# Patient Record
Sex: Male | Born: 1975
Health system: Southern US, Community
[De-identification: ages and names within clinical notes are randomized; demographics above are authoritative.]

## PROBLEM LIST (undated history)

## (undated) DIAGNOSIS — J45909 Unspecified asthma, uncomplicated: Secondary | ICD-10-CM

## (undated) HISTORY — PX: SKIN GRAFT: SHX250

## (undated) HISTORY — PX: SKULL FRACTURE ELEVATION: SHX781

## (undated) HISTORY — PX: ANKLE SURGERY: SHX546

## (undated) HISTORY — DX: Unspecified asthma, uncomplicated: J45.909

---

## 1997-08-14 ENCOUNTER — Emergency Department (HOSPITAL_COMMUNITY): Admission: EM | Admit: 1997-08-14 | Discharge: 1997-08-14 | Payer: Self-pay | Admitting: Emergency Medicine

## 1999-02-14 ENCOUNTER — Encounter: Payer: Self-pay | Admitting: Emergency Medicine

## 1999-02-14 ENCOUNTER — Emergency Department (HOSPITAL_COMMUNITY): Admission: EM | Admit: 1999-02-14 | Discharge: 1999-02-14 | Payer: Self-pay | Admitting: Emergency Medicine

## 1999-08-05 ENCOUNTER — Emergency Department (HOSPITAL_COMMUNITY): Admission: EM | Admit: 1999-08-05 | Discharge: 1999-08-05 | Payer: Self-pay | Admitting: Emergency Medicine

## 1999-08-05 ENCOUNTER — Encounter: Payer: Self-pay | Admitting: Emergency Medicine

## 2001-01-29 ENCOUNTER — Encounter: Payer: Self-pay | Admitting: Emergency Medicine

## 2001-01-29 ENCOUNTER — Emergency Department (HOSPITAL_COMMUNITY): Admission: EM | Admit: 2001-01-29 | Discharge: 2001-01-29 | Payer: Self-pay | Admitting: Emergency Medicine

## 2003-11-02 ENCOUNTER — Emergency Department (HOSPITAL_COMMUNITY): Admission: EM | Admit: 2003-11-02 | Discharge: 2003-11-02 | Payer: Self-pay | Admitting: Emergency Medicine

## 2004-05-15 ENCOUNTER — Emergency Department (HOSPITAL_COMMUNITY): Admission: EM | Admit: 2004-05-15 | Discharge: 2004-05-15 | Payer: Self-pay | Admitting: Emergency Medicine

## 2005-03-12 ENCOUNTER — Emergency Department (HOSPITAL_COMMUNITY): Admission: EM | Admit: 2005-03-12 | Discharge: 2005-03-12 | Payer: Self-pay | Admitting: Emergency Medicine

## 2005-04-10 ENCOUNTER — Emergency Department (HOSPITAL_COMMUNITY): Admission: EM | Admit: 2005-04-10 | Discharge: 2005-04-10 | Payer: Self-pay | Admitting: Emergency Medicine

## 2008-02-14 ENCOUNTER — Emergency Department (HOSPITAL_COMMUNITY): Admission: EM | Admit: 2008-02-14 | Discharge: 2008-02-14 | Payer: Self-pay | Admitting: Emergency Medicine

## 2008-05-12 ENCOUNTER — Emergency Department (HOSPITAL_COMMUNITY): Admission: EM | Admit: 2008-05-12 | Discharge: 2008-05-12 | Payer: Self-pay | Admitting: Emergency Medicine

## 2008-05-21 ENCOUNTER — Emergency Department (HOSPITAL_COMMUNITY): Admission: EM | Admit: 2008-05-21 | Discharge: 2008-05-21 | Payer: Self-pay | Admitting: Emergency Medicine

## 2008-05-25 ENCOUNTER — Encounter: Admission: RE | Admit: 2008-05-25 | Discharge: 2008-05-25 | Payer: Self-pay | Admitting: Family Medicine

## 2008-05-30 ENCOUNTER — Encounter: Admission: RE | Admit: 2008-05-30 | Discharge: 2008-08-10 | Payer: Self-pay | Admitting: Family Medicine

## 2008-12-02 ENCOUNTER — Emergency Department (HOSPITAL_COMMUNITY): Admission: EM | Admit: 2008-12-02 | Discharge: 2008-12-02 | Payer: Self-pay | Admitting: Emergency Medicine

## 2009-08-01 ENCOUNTER — Emergency Department (HOSPITAL_COMMUNITY): Admission: EM | Admit: 2009-08-01 | Discharge: 2009-08-01 | Payer: Self-pay | Admitting: Family Medicine

## 2009-09-03 ENCOUNTER — Emergency Department (HOSPITAL_COMMUNITY): Admission: EM | Admit: 2009-09-03 | Discharge: 2009-09-04 | Payer: Self-pay | Admitting: Emergency Medicine

## 2009-10-30 ENCOUNTER — Emergency Department (HOSPITAL_BASED_OUTPATIENT_CLINIC_OR_DEPARTMENT_OTHER): Admission: EM | Admit: 2009-10-30 | Discharge: 2009-10-30 | Payer: Self-pay | Admitting: Emergency Medicine

## 2009-10-30 ENCOUNTER — Ambulatory Visit: Payer: Self-pay | Admitting: Radiology

## 2009-11-21 ENCOUNTER — Emergency Department (HOSPITAL_COMMUNITY): Admission: EM | Admit: 2009-11-21 | Discharge: 2009-11-21 | Payer: Self-pay | Admitting: Family Medicine

## 2009-12-04 ENCOUNTER — Emergency Department (HOSPITAL_COMMUNITY): Admission: EM | Admit: 2009-12-04 | Discharge: 2009-12-04 | Payer: Self-pay | Admitting: Emergency Medicine

## 2010-01-09 ENCOUNTER — Emergency Department (HOSPITAL_COMMUNITY)
Admission: EM | Admit: 2010-01-09 | Discharge: 2010-01-09 | Payer: Self-pay | Source: Home / Self Care | Admitting: Family Medicine

## 2010-04-30 ENCOUNTER — Inpatient Hospital Stay (INDEPENDENT_AMBULATORY_CARE_PROVIDER_SITE_OTHER)
Admission: RE | Admit: 2010-04-30 | Discharge: 2010-04-30 | Disposition: A | Discharge status: Home / Self Care | Payer: Self-pay | Source: Ambulatory Visit | Attending: Family Medicine | Admitting: Family Medicine

## 2010-04-30 DIAGNOSIS — M1990 Unspecified osteoarthritis, unspecified site: Secondary | ICD-10-CM

## 2010-04-30 DIAGNOSIS — B353 Tinea pedis: Secondary | ICD-10-CM

## 2010-04-30 LAB — COMPREHENSIVE METABOLIC PANEL
AST: 20 U/L (ref 0–37)
Albumin: 4.1 g/dL (ref 3.5–5.2)
Alkaline Phosphatase: 74 U/L (ref 39–117)
Calcium: 8.9 mg/dL (ref 8.4–10.5)
Creatinine, Ser: 1.06 mg/dL (ref 0.4–1.5)
GFR calc Af Amer: >60 mL/min (ref >60)
Glucose, Bld: 129 mg/dL — ABNORMAL HIGH (ref 70–99)
Potassium: 3.9 mEq/L (ref 3.5–5.1)
Sodium: 139 mEq/L (ref 135–145)
Total Bilirubin: 1.1 mg/dL (ref 0.3–1.2)

## 2010-04-30 LAB — URIC ACID: Uric Acid, Serum: 5.8 mg/dL (ref 4.0–7.8)

## 2011-11-29 ENCOUNTER — Emergency Department (HOSPITAL_COMMUNITY)
Admission: EM | Admit: 2011-11-29 | Discharge: 2011-11-29 | Disposition: A | Discharge status: Home Health | Payer: Self-pay | Attending: Emergency Medicine | Admitting: Emergency Medicine

## 2011-11-29 ENCOUNTER — Encounter (HOSPITAL_COMMUNITY): Payer: Self-pay | Admitting: Emergency Medicine

## 2011-11-29 DIAGNOSIS — K006 Disturbances in tooth eruption: Secondary | ICD-10-CM | POA: Insufficient documentation

## 2011-11-29 DIAGNOSIS — F17200 Nicotine dependence, unspecified, uncomplicated: Secondary | ICD-10-CM | POA: Insufficient documentation

## 2011-11-29 DIAGNOSIS — K0889 Other specified disorders of teeth and supporting structures: Secondary | ICD-10-CM

## 2011-11-29 DIAGNOSIS — Z716 Tobacco abuse counseling: Secondary | ICD-10-CM

## 2011-11-29 DIAGNOSIS — Z7189 Other specified counseling: Secondary | ICD-10-CM | POA: Insufficient documentation

## 2011-11-29 MED ORDER — HYDROCODONE-ACETAMINOPHEN 5-500 MG PO TABS: 1.0000 | ORAL_TABLET | Freq: Four times a day (QID) | ORAL | Status: DC | PRN

## 2011-11-29 MED ORDER — CLINDAMYCIN HCL 300 MG PO CAPS: 300.0000 mg | ORAL_CAPSULE | Freq: Three times a day (TID) | ORAL | Status: DC
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
## 2012-08-11 ENCOUNTER — Ambulatory Visit: Payer: Self-pay | Admitting: Family Medicine

## 2012-08-11 VITALS — BP 110/72 | HR 78 | Temp 97.8°F | Resp 18 | Ht 71.0 in | Wt 199.0 lb

## 2012-08-11 DIAGNOSIS — L500 Allergic urticaria: Secondary | ICD-10-CM

## 2012-08-11 DIAGNOSIS — R062 Wheezing: Secondary | ICD-10-CM

## 2012-08-11 MED ORDER — EPINEPHRINE 0.3 MG/0.3ML IJ SOAJ: 0.3000 mg | Freq: Once | INTRAMUSCULAR | Status: DC

## 2012-08-11 MED ORDER — ALBUTEROL SULFATE HFA 108 (90 BASE) MCG/ACT IN AERS: 2.0000 | INHALATION_SPRAY | Freq: Four times a day (QID) | RESPIRATORY_TRACT | Status: DC | PRN

## 2012-08-11 MED ORDER — METHYLPREDNISOLONE SODIUM SUCC 125 MG IJ SOLR
125.0000 mg | Freq: Once | INTRAMUSCULAR | Status: AC
  Administered 2012-08-11: 125 mg via INTRAMUSCULAR

## 2012-08-11 MED ORDER — DIPHENHYDRAMINE HCL 50 MG/ML IJ SOLN
50.0000 mg | Freq: Once | INTRAMUSCULAR | Status: AC
  Administered 2012-08-11: 50 mg via INTRAMUSCULAR

## 2012-08-11 MED ORDER — ALBUTEROL SULFATE (2.5 MG/3ML) 0.083% IN NEBU
2.5000 mg | INHALATION_SOLUTION | Freq: Once | RESPIRATORY_TRACT | Status: AC
  Administered 2012-08-11: 2.5 mg via RESPIRATORY_TRACT

## 2012-08-11 NOTE — Progress Notes (Signed)
Administered 125 mg solumedrol 50 mg benadryl both IM and 150 mg of Ranitidine by mouth at 845 am
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
## 2012-08-11 NOTE — Patient Instructions (Signed)
Use the EpiPen in case of emergency with difficulty breathing  Use albuterol inhaler 2 puffs every 4-6 hours as needed for wheezing  Take Benadryl 25-50 mg every 6 hours as needed for allergy, or use the Zyrtec (cetirizine) one once or twice daily for allergy and antihistamine  Take Zantac (ranitidine) 150 mg twice daily for about a week for the hives  Take prednisone 20 mg 3 tablets daily for 2 days, then 2 tablets daily for 2 days, then one tablet daily for 2 days for the allergic reaction  Return or go to the emergency room if worse at anytime  Try to think through the last few days and see if she can figure out what you might have been allergic to.   Hives Hives are itchy, red, swollen areas of the skin. They can vary in size and location on your body. Hives can come and go for hours or several days (acute hives) or for several weeks (chronic hives). Hives do not spread from person to person (noncontagious). They may get worse with scratching, exercise, and emotional stress. CAUSES   Allergic reaction to food, additives, or drugs.  Infections, including the common cold.  Illness, such as vasculitis, lupus, or thyroid disease.  Exposure to sunlight, heat, or cold.  Exercise.  Stress.  Contact with chemicals. SYMPTOMS   Red or white swollen patches on the skin. The patches may change size, shape, and location quickly and repeatedly.  Itching.  Swelling of the hands, feet, and face. This may occur if hives develop deeper in the skin. DIAGNOSIS  Your caregiver can usually tell what is wrong by performing a physical exam. Skin or blood tests may also be done to determine the cause of your hives. In some cases, the cause cannot be determined. TREATMENT  Mild cases usually get better with medicines such as antihistamines. Severe cases may require an emergency epinephrine injection. If the cause of your hives is known, treatment includes avoiding that trigger.  HOME CARE  INSTRUCTIONS   Avoid causes that trigger your hives.  Take antihistamines as directed by your caregiver to reduce the severity of your hives. Non-sedating or low-sedating antihistamines are usually recommended. Do not drive while taking an antihistamine.  Take any other medicines prescribed for itching as directed by your caregiver.  Wear loose-fitting clothing.  Keep all follow-up appointments as directed by your caregiver. SEEK MEDICAL CARE IF:   You have persistent or severe itching that is not relieved with medicine.  You have painful or swollen joints. SEEK IMMEDIATE MEDICAL CARE IF:   You have a fever.  Your tongue or lips are swollen.  You have trouble breathing or swallowing.  You feel tightness in the throat or chest.  You have abdominal pain. These problems may be the first sign of a life-threatening allergic reaction. Call your local emergency services (911 in [place] MAKE SURE YOU:   Understand these instructions.  Will watch your condition.  Will get help right away if you are not doing well or get worse. Document Released: 01/20/2005 Document Revised: 07/22/2011 Document Reviewed: 04/15/2011 [facility] Patient Information 2014 [place], [place].

## 2012-08-12 ENCOUNTER — Other Ambulatory Visit: Payer: Self-pay | Admitting: *Deleted

## 2012-08-12 MED ORDER — PREDNISONE 20 MG PO TABS: ORAL_TABLET | ORAL | Status: DC

## 2012-10-17 ENCOUNTER — Encounter (HOSPITAL_COMMUNITY): Payer: Self-pay | Admitting: Emergency Medicine

## 2012-10-17 ENCOUNTER — Emergency Department (HOSPITAL_COMMUNITY)
Admission: EM | Admit: 2012-10-17 | Discharge: 2012-10-17 | Disposition: A | Discharge status: Home / Self Care | Payer: Self-pay | Attending: Emergency Medicine | Admitting: Emergency Medicine

## 2012-10-17 DIAGNOSIS — F17200 Nicotine dependence, unspecified, uncomplicated: Secondary | ICD-10-CM | POA: Insufficient documentation

## 2012-10-17 DIAGNOSIS — J45901 Unspecified asthma with (acute) exacerbation: Secondary | ICD-10-CM | POA: Insufficient documentation

## 2012-10-17 MED ORDER — ALBUTEROL SULFATE HFA 108 (90 BASE) MCG/ACT IN AERS
2.0000 | INHALATION_SPRAY | Freq: Once | RESPIRATORY_TRACT | Status: AC
  Administered 2012-10-17: 2 via RESPIRATORY_TRACT
  Filled 2012-10-17: qty 6.7

## 2012-10-17 NOTE — ED Provider Notes (Signed)
CSN: [id number]     Arrival date & time 10/17/12  0134 History   First MD Initiated Contact with Patient 10/17/12 0243     Chief Complaint  Patient presents with  . Asthma   (Consider location/radiation/quality/duration/timing/severity/associated sxs/prior Treatment) HPI History provided by pt.   Pt presents w/ asthma exacerbation.  Sx include diffuse chest tightness, SOB, wheezing and coughing.  Onset yesterday evening while at work; attributes to cigarette smoke.  Had a breathing treatment en route to hospital and reports sx improvement.  Most recent exacerbation several years ago.  Does not have an albuterol inhaler at home. Past Medical History  Diagnosis Date  . Asthma    Past Surgical History  Procedure Laterality Date  . Skin graft    . Ankle surgery    . Skull fracture elevation      pt unsure of specifics   History reviewed. No pertinent family history. History  Substance Use Topics  . Smoking status: Current Every Day Smoker -- 2.00 packs/day    Types: Cigarettes  . Smokeless tobacco: Not on file  . Alcohol Use: 1.0 oz/week    2 drink(s) per week     Comment: occasion    Review of Systems  All other systems reviewed and are negative.    Allergies  Other  Home Medications   Current Outpatient Rx  Name  Route  Sig  Dispense  Refill  . albuterol (PROVENTIL HFA;VENTOLIN HFA) 108 (90 BASE) MCG/ACT inhaler   Inhalation   Inhale 2 puffs into the lungs every 6 (six) hours as needed for wheezing.   1 Inhaler   0   . EPINEPHrine (EPIPEN) 0.3 mg/0.3 mL SOAJ   Intramuscular   Inject 0.3 mLs (0.3 mg total) into the muscle once.   1 Device   3    BP 113/65  Pulse 79  Temp(Src) 97.5 F (36.4 C) (Oral)  SpO2 100% Physical Exam  Nursing note and vitals reviewed. Constitutional: He is oriented to person, place, and time. He appears well-developed and well-nourished. No distress.  HENT:  Head: Normocephalic and atraumatic.  Eyes:  Normal appearance  Neck:  Normal range of motion.  Cardiovascular: Normal rate and regular rhythm.   Pulmonary/Chest: Effort normal and breath sounds normal. No respiratory distress.  Slight expiratory wheezing right lung base and mid-lung.    Musculoskeletal: Normal range of motion.  Neurological: He is alert and oriented to person, place, and time.  Skin: Skin is warm and dry. No rash noted.  Psychiatric: He has a normal mood and affect. His behavior is normal.    ED Course  Procedures (including critical care time) Labs Review Labs Reviewed - No data to display Imaging Review No results found.  MDM   1. Asthma exacerbation    37yo M presents w/ typical asthma exacerbation.  Sx improved s/p breathing treatment en route to hospital.  On my exam, afebrile, no respiratory distress, expiratory wheezing on right.  Pt feels well enough to go home.  Ambulated by nursing staff and did not become dyspneic nor drop O2 sat.  D/c'd home w/ albuterol inhaler and referral to primary care.  Return precautions discussed. 3:34 AM     Otilio Miu, PA-C 10/17/12 [phone]

## 2012-10-17 NOTE — ED Provider Notes (Signed)
Medical screening examination/treatment/procedure(s) were performed by non-physician practitioner and as supervising physician I was immediately available for consultation/collaboration.   Gavin Telford L Jewelz Kobus, MD 10/17/12 0719 

## 2012-10-17 NOTE — ED Notes (Signed)
Bed: WU98 Expected date: 10/17/12 Expected time: 1:47 AM Means of arrival: Ambulance Comments: Bed 15, EMS, 68M, Asthma Exacerbation

## 2012-10-17 NOTE — ED Notes (Signed)
Pt was ambulated up and down the hall while having the pulse oxygen sensor on his finger. Pt did not desat during the procedure. Pt stayed at 99% SPO2

## 2012-10-17 NOTE — ED Notes (Addendum)
Pt arrived via EMS due to shortness of breath. 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 History of  Asthma and this event feels the same. Pt is awake and alert. With no other medical complaints.

## 2012-11-09 ENCOUNTER — Encounter (HOSPITAL_COMMUNITY): Payer: Self-pay | Admitting: *Deleted

## 2012-11-09 ENCOUNTER — Emergency Department (HOSPITAL_COMMUNITY)
Admission: EM | Admit: 2012-11-09 | Discharge: 2012-11-09 | Disposition: A | Discharge status: Home / Self Care | Payer: Self-pay | Attending: Emergency Medicine | Admitting: Emergency Medicine

## 2012-11-09 DIAGNOSIS — R079 Chest pain, unspecified: Secondary | ICD-10-CM | POA: Insufficient documentation

## 2012-11-09 DIAGNOSIS — J45901 Unspecified asthma with (acute) exacerbation: Secondary | ICD-10-CM | POA: Insufficient documentation

## 2012-11-09 DIAGNOSIS — Z79899 Other long term (current) drug therapy: Secondary | ICD-10-CM | POA: Insufficient documentation

## 2012-11-09 DIAGNOSIS — F17200 Nicotine dependence, unspecified, uncomplicated: Secondary | ICD-10-CM | POA: Insufficient documentation

## 2012-11-09 MED ORDER — ALBUTEROL SULFATE (5 MG/ML) 0.5% IN NEBU: 2.5000 mg | INHALATION_SOLUTION | Freq: Once | RESPIRATORY_TRACT | Status: DC

## 2012-11-09 MED ORDER — ALBUTEROL SULFATE HFA 108 (90 BASE) MCG/ACT IN AERS
2.0000 | INHALATION_SPRAY | Freq: Four times a day (QID) | RESPIRATORY_TRACT | Status: DC
  Filled 2012-11-09: qty 6.7

## 2012-11-09 MED ORDER — ALBUTEROL SULFATE (5 MG/ML) 0.5% IN NEBU
5.0000 mg | INHALATION_SOLUTION | Freq: Once | RESPIRATORY_TRACT | Status: AC
  Administered 2012-11-09: 5 mg via RESPIRATORY_TRACT
  Filled 2012-11-09: qty 1

## 2012-11-09 MED ORDER — PREDNISONE 20 MG PO TABS
60.0000 mg | ORAL_TABLET | Freq: Once | ORAL | Status: AC
  Administered 2012-11-09: 60 mg via ORAL
  Filled 2012-11-09: qty 3

## 2012-11-09 MED ORDER — PREDNISONE 20 MG PO TABS: 20.0000 mg | ORAL_TABLET | Freq: Two times a day (BID) | ORAL | Status: DC

## 2012-11-09 MED ORDER — IPRATROPIUM BROMIDE 0.02 % IN SOLN
0.5000 mg | Freq: Once | RESPIRATORY_TRACT | Status: AC
  Administered 2012-11-09: 0.5 mg via RESPIRATORY_TRACT
  Filled 2012-11-09: qty 2.5

## 2012-11-09 NOTE — ED Notes (Signed)
Pt c/o wheezing; drainage

## 2012-11-24 NOTE — ED Provider Notes (Signed)
Medical screening examination/treatment/procedure(s) were performed by non-physician practitioner and as supervising physician I was immediately available for consultation/collaboration.      Hanley Seamen, MD 11/24/12 2252

## 2012-11-24 NOTE — ED Provider Notes (Signed)
CSN: [id number]     Arrival date & time 11/09/12  0218 History   First MD Initiated Contact with Patient 11/09/12 [phone]     Chief Complaint  Patient presents with  . URI   (Consider location/radiation/quality/duration/timing/severity/associated sxs/prior Treatment) HPI History provided by pt.   Pt presents w/ typical asthma exacerbation.  Sx include wheezing, CP, SOB and cough.  No relief w/ his albuterol inhaler.  Also c/o rhinorrhea.  Denies fever and other URI sx.  Per prior chart, pt seen for same on 10/17/12.   Past Medical History  Diagnosis Date  . Asthma    Past Surgical History  Procedure Laterality Date  . Skin graft    . Ankle surgery    . Skull fracture elevation      pt unsure of specifics   No family history on file. History  Substance Use Topics  . Smoking status: Current Every Day Smoker -- 2.00 packs/day    Types: Cigarettes  . Smokeless tobacco: Not on file  . Alcohol Use: 1.0 oz/week    2 drink(s) per week     Comment: occasion    Review of Systems  All other systems reviewed and are negative.    Allergies  Other  Home Medications   Current Outpatient Rx  Name  Route  Sig  Dispense  Refill  . albuterol (PROVENTIL HFA;VENTOLIN HFA) 108 (90 BASE) MCG/ACT inhaler   Inhalation   Inhale 2 puffs into the lungs every 6 (six) hours as needed for wheezing.   1 Inhaler   0   . predniSONE (DELTASONE) 20 MG tablet   Oral   Take 1 tablet (20 mg total) by mouth 2 (two) times daily.   10 tablet   0    BP 148/95  Pulse 65  Temp(Src) 97.8 F (36.6 C) (Oral)  Resp 18  SpO2 99% Physical Exam  Nursing note and vitals reviewed. Constitutional: He is oriented to person, place, and time. He appears well-developed and well-nourished. No distress.  HENT:  Head: Normocephalic and atraumatic.  Eyes:  Normal appearance  Neck: Normal range of motion.  Cardiovascular: Normal rate and regular rhythm.   Pulmonary/Chest: Effort normal. No respiratory distress.   Diffuse expiratory wheezing.  Coughing  Musculoskeletal: Normal range of motion.  Neurological: He is alert and oriented to person, place, and time.  Skin: Skin is warm and dry. No rash noted.  Psychiatric: He has a normal mood and affect. His behavior is normal.    ED Course  Procedures (including critical care time) Labs Review Labs Reviewed - No data to display Imaging Review No results found.  EKG Interpretation   None       MDM   1. Asthma exacerbation    Pt presented to ED w/ typical asthma exacerbation.  Had two nebulizer treatments, sx improved, ambulated w/out becoming dyspneic.  D/c'd home w/ prednisone.  He has an albuterol inhaler.      Otilio Miu, PA-C 11/24/12 2015

## 2013-06-02 ENCOUNTER — Emergency Department (HOSPITAL_COMMUNITY)
Admission: EM | Admit: 2013-06-02 | Discharge: 2013-06-02 | Disposition: A | Discharge status: Home / Self Care | Payer: Self-pay | Attending: Emergency Medicine | Admitting: Emergency Medicine

## 2013-06-02 ENCOUNTER — Emergency Department (HOSPITAL_COMMUNITY): Payer: Self-pay

## 2013-06-02 ENCOUNTER — Encounter (HOSPITAL_COMMUNITY): Payer: Self-pay | Admitting: Emergency Medicine

## 2013-06-02 DIAGNOSIS — M542 Cervicalgia: Secondary | ICD-10-CM | POA: Insufficient documentation

## 2013-06-02 DIAGNOSIS — J45901 Unspecified asthma with (acute) exacerbation: Secondary | ICD-10-CM | POA: Insufficient documentation

## 2013-06-02 DIAGNOSIS — G8929 Other chronic pain: Secondary | ICD-10-CM | POA: Insufficient documentation

## 2013-06-02 DIAGNOSIS — J209 Acute bronchitis, unspecified: Secondary | ICD-10-CM

## 2013-06-02 DIAGNOSIS — R110 Nausea: Secondary | ICD-10-CM | POA: Insufficient documentation

## 2013-06-02 DIAGNOSIS — F17200 Nicotine dependence, unspecified, uncomplicated: Secondary | ICD-10-CM | POA: Insufficient documentation

## 2013-06-02 DIAGNOSIS — R509 Fever, unspecified: Secondary | ICD-10-CM | POA: Insufficient documentation

## 2013-06-02 MED ORDER — HYDROCODONE-ACETAMINOPHEN 5-325 MG PO TABS: 1.0000 | ORAL_TABLET | ORAL | Status: DC | PRN

## 2013-06-02 MED ORDER — PREDNISONE 20 MG PO TABS
60.0000 mg | ORAL_TABLET | Freq: Once | ORAL | Status: AC
  Administered 2013-06-02: 60 mg via ORAL
  Filled 2013-06-02: qty 3

## 2013-06-02 MED ORDER — OXYCODONE-ACETAMINOPHEN 5-325 MG PO TABS
1.0000 | ORAL_TABLET | Freq: Once | ORAL | Status: AC
  Administered 2013-06-02: 1 via ORAL
  Filled 2013-06-02: qty 1

## 2013-06-02 MED ORDER — ALBUTEROL SULFATE HFA 108 (90 BASE) MCG/ACT IN AERS
2.0000 | INHALATION_SPRAY | RESPIRATORY_TRACT | Status: DC | PRN
  Administered 2013-06-02: 2 via RESPIRATORY_TRACT
  Filled 2013-06-02: qty 6.7

## 2013-06-02 MED ORDER — AZITHROMYCIN 250 MG PO TABS: 250.0000 mg | ORAL_TABLET | Freq: Every day | ORAL | Status: DC

## 2013-06-02 MED ORDER — CYCLOBENZAPRINE HCL 10 MG PO TABS
10.0000 mg | ORAL_TABLET | Freq: Once | ORAL | Status: AC
  Administered 2013-06-02: 10 mg via ORAL
  Filled 2013-06-02: qty 1

## 2013-06-02 MED ORDER — MELOXICAM 7.5 MG PO TABS: 7.5000 mg | ORAL_TABLET | Freq: Every day | ORAL | Status: DC

## 2013-06-02 MED ORDER — IPRATROPIUM-ALBUTEROL 0.5-2.5 (3) MG/3ML IN SOLN
3.0000 mL | RESPIRATORY_TRACT | Status: DC
  Administered 2013-06-02: 3 mL via RESPIRATORY_TRACT
  Filled 2013-06-02: qty 3

## 2013-06-02 MED ORDER — ALBUTEROL SULFATE (2.5 MG/3ML) 0.083% IN NEBU
5.0000 mg | INHALATION_SOLUTION | Freq: Once | RESPIRATORY_TRACT | Status: AC
  Administered 2013-06-02: 5 mg via RESPIRATORY_TRACT
  Filled 2013-06-02: qty 6

## 2013-06-02 NOTE — ED Provider Notes (Signed)
Medical screening examination/treatment/procedure(s) were performed by non-physician practitioner and as supervising physician I was immediately available for consultation/collaboration.   EKG Interpretation None        Innocence Schlotzhauer, MD 06/02/13 1742 

## 2013-06-02 NOTE — ED Provider Notes (Signed)
CSN: [id number]     Arrival date & time 06/02/13  1048 History   First MD Initiated Contact with Patient 06/02/13 1112     Chief Complaint  Patient presents with  . Cough     (Consider location/radiation/quality/duration/timing/severity/associated sxs/prior Treatment) HPI Pt is a 38 year old male with history of asthma complaining of one-week history gradually worsening shortness of breath dry cough. Patient states he ran out of his albuterol inhaler one week ago.  Patient states he works in [employer] industry and is around a lot of dust which exacerbates his symptoms. Reports having cough and nausea associated with fever of MAXIMUM TEMPERATURE 100.  Denies taking Tylenol ibuprofen for symptoms. Denies sick contacts or recent travel.   The patient also reports chronic neck pain that has exacerbated over the last 4 days. Denies new injury or falls. He states pain is achy and sores 7/10 at worst. Denies and pain medication. States she uses warm water soaks to help with pain minimal relief. Requesting pain medication at this time. Denies numbness or tingling in arms or legs.   Past Medical History  Diagnosis Date  . Asthma    Past Surgical History  Procedure Laterality Date  . Skin graft    . Ankle surgery    . Skull fracture elevation      pt unsure of specifics   No family history on file. History  Substance Use Topics  . Smoking status: Current Every Day Smoker -- 2.00 packs/day    Types: Cigarettes  . Smokeless tobacco: Not on file  . Alcohol Use: 1.0 oz/week    2 drink(s) per week     Comment: occasion    Review of Systems  Constitutional: Positive for fever. Negative for chills.  HENT: Negative for congestion, sore throat and voice change.   Respiratory: Positive for cough, chest tightness, shortness of breath and wheezing. Negative for stridor.   Cardiovascular: Negative for chest pain, palpitations and leg swelling.  Gastrointestinal: Positive for nausea.  Negative for vomiting, abdominal pain, diarrhea and constipation.  Musculoskeletal: Positive for myalgias and neck pain. Negative for neck stiffness.       Chronic neck pain  All other systems reviewed and are negative.     Allergies  Other  Home Medications   Prior to Admission medications   Medication Sig Start Date End Date Taking? Authorizing Provider  albuterol (PROVENTIL HFA;VENTOLIN HFA) 108 (90 BASE) MCG/ACT inhaler Inhale 1 puff into the lungs every 6 (six) hours as needed for wheezing or shortness of breath.   Yes Historical Provider, MD   BP 161/65  Pulse 100  Temp(Src) 98.4 F (36.9 C) (Oral)  Resp 20  Ht 5\' 9"  (1.753 m)  Wt 225 lb (102.059 kg)  BMI 33.21 kg/m2  SpO2 97% Physical Exam  Nursing note and vitals reviewed. Constitutional: He appears well-developed and well-nourished.  HENT:  Head: Normocephalic and atraumatic.  Eyes: Conjunctivae are normal. No scleral icterus.  Neck: Normal range of motion. Neck supple.  Cardiovascular: Normal rate, regular rhythm and normal heart sounds.   Pulmonary/Chest: Effort normal. No respiratory distress. He has wheezes ( diffuse expiratory wheeze). He has no rales. He exhibits no tenderness.  No respiratory distress, able to speak in full sentences w/o difficulty. Lungs: diffuse expiratory wheeze w/o rhonchi  Abdominal: Soft. Bowel sounds are normal. He exhibits no distension and no mass. There is no tenderness. There is no rebound and no guarding.  Musculoskeletal: Normal range of motion.  Lymphadenopathy:  He has no cervical adenopathy.  Neurological: He is alert.  Skin: Skin is warm and dry.    ED Course  Procedures (including critical care time) Labs Review Labs Reviewed - No data to display  Imaging Review Dg Chest 2 View (if Patient Has Fever And/or Copd)  06/02/2013   CLINICAL DATA:  Right-sided chest pain  EXAM: CHEST  2 VIEW  COMPARISON:  Radiograph 12/02/2008  FINDINGS: Normal cardiac silhouette. There  is mild bronchitic markings centrally. No effusion, infiltrate, pneumothorax.  IMPRESSION: Central bronchitic markings could represent bronchitis. No focal infiltrate. No pneumothorax or pulmonary edema.   Electronically Signed   By: Genevive BiStewart  Edmunds M.D.   On: 06/02/2013 12:39     EKG Interpretation None      MDM   Final diagnoses:  Asthma exacerbation  Chronic neck pain  Bronchitis, acute    Pt with hx of asthma presenting with asthma exacerbation. Pt with 2 neb tx in ED which did improve his breathing, Lungs: CTAB after tx.   CXR: central bronchitis markings could respresent bronchitis. Will tx for acute bronchitis.  Rx: azithromycin and prednisone. Albuterol inhaler provided in ED.   Pt also c/o exacerbation of neck pain.  No hx of trauma, do not believe further imaging needed at this time.  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 Pain medication provided.  Advised to f/u with PCP. Return precautions provided. Pt verbalized understanding and agreement with tx plan.   Junius FinnerErin O'Malley, PA-C 06/02/13 [phone]

## 2013-06-02 NOTE — ED Notes (Signed)
Pt ambulates without distress. Respirations easy non labored. 

## 2013-06-02 NOTE — Discharge Instructions (Signed)
Acute Bronchitis  Bronchitis is when the airways that extend from the windpipe into the lungs get red, puffy, and painful (inflamed). Bronchitis often causes thick spit (mucus) to develop. This leads to a cough. A cough is the most common symptom of bronchitis.  In acute bronchitis, the condition usually begins suddenly and goes away over time (usually in 2 weeks). Smoking, allergies, and asthma can make bronchitis worse. Repeated episodes of bronchitis may cause more lung problems.  HOME CARE  · Rest.  · Drink enough fluids to keep your pee (urine) clear or pale yellow (unless you need to limit fluids as told by your doctor).  · Only take over-the-counter or prescription medicines as told by your doctor.  · Avoid smoking and secondhand smoke. These can make bronchitis worse. If you are a smoker, think about using nicotine gum or skin patches. Quitting smoking will help your lungs heal faster.  · Reduce the chance of getting bronchitis again by:  · Washing your hands often.  · Avoiding people with cold symptoms.  · Trying not to touch your hands to your mouth, nose, or eyes.  · Follow up with your doctor as told.  GET HELP IF:  Your symptoms do not improve after 1 week of treatment. Symptoms include:  · Cough.  · Fever.  · Coughing up thick spit.  · Body aches.  · Chest congestion.  · Chills.  · Shortness of breath.  · Sore throat.  GET HELP RIGHT AWAY IF:   · You have an increased fever.  · You have chills.  · You have severe shortness of breath.  · You have bloody thick spit (sputum).  · You throw up (vomit) often.  · You lose too much body fluid (dehydration).  · You have a severe headache.  · You faint.  MAKE SURE YOU:   · Understand these instructions.  · Will watch your condition.  · Will get help right away if you are not doing well or get worse.  Document Released: 07/09/2007 Document Revised: 09/22/2012 Document Reviewed: 07/13/2012  [employer]® Patient Information ©2014 [employer].

## 2013-06-02 NOTE — ED Notes (Addendum)
Pt reports he began having a cough yesterday.  Pt with hx of asthma.  States he has run out of his inhaler and needs another one.  Pt c/o back pain.

## 2013-06-02 NOTE — Discharge Planning (Signed)
[facility] Community Liaison  Spoke to patient about follow up care and establishing care with a provider. Patient [facility] and is waiting for his card to arrive. Resource guide and my contact information was provided for any future questions or concerns.

## 2013-08-25 ENCOUNTER — Encounter (HOSPITAL_COMMUNITY): Payer: Self-pay | Admitting: Emergency Medicine

## 2013-08-25 ENCOUNTER — Emergency Department (HOSPITAL_COMMUNITY)
Admission: EM | Admit: 2013-08-25 | Discharge: 2013-08-25 | Disposition: A | Discharge status: Home / Self Care | Payer: BC Managed Care – PPO | Attending: Emergency Medicine | Admitting: Emergency Medicine

## 2013-08-25 DIAGNOSIS — Z792 Long term (current) use of antibiotics: Secondary | ICD-10-CM | POA: Insufficient documentation

## 2013-08-25 DIAGNOSIS — Y9389 Activity, other specified: Secondary | ICD-10-CM | POA: Insufficient documentation

## 2013-08-25 DIAGNOSIS — IMO0002 Reserved for concepts with insufficient information to code with codable children: Secondary | ICD-10-CM | POA: Insufficient documentation

## 2013-08-25 DIAGNOSIS — S39012A Strain of muscle, fascia and tendon of lower back, initial encounter: Secondary | ICD-10-CM

## 2013-08-25 DIAGNOSIS — S336XXA Sprain of sacroiliac joint, initial encounter: Secondary | ICD-10-CM | POA: Insufficient documentation

## 2013-08-25 DIAGNOSIS — M545 Low back pain, unspecified: Secondary | ICD-10-CM | POA: Insufficient documentation

## 2013-08-25 DIAGNOSIS — Y9289 Other specified places as the place of occurrence of the external cause: Secondary | ICD-10-CM | POA: Insufficient documentation

## 2013-08-25 DIAGNOSIS — X503XXA Overexertion from repetitive movements, initial encounter: Secondary | ICD-10-CM | POA: Insufficient documentation

## 2013-08-25 DIAGNOSIS — S46911A Strain of unspecified muscle, fascia and tendon at shoulder and upper arm level, right arm, initial encounter: Secondary | ICD-10-CM

## 2013-08-25 DIAGNOSIS — Z79899 Other long term (current) drug therapy: Secondary | ICD-10-CM | POA: Insufficient documentation

## 2013-08-25 DIAGNOSIS — Z791 Long term (current) use of non-steroidal anti-inflammatories (NSAID): Secondary | ICD-10-CM | POA: Insufficient documentation

## 2013-08-25 DIAGNOSIS — X500XXA Overexertion from strenuous movement or load, initial encounter: Secondary | ICD-10-CM | POA: Insufficient documentation

## 2013-08-25 DIAGNOSIS — F17200 Nicotine dependence, unspecified, uncomplicated: Secondary | ICD-10-CM | POA: Insufficient documentation

## 2013-08-25 DIAGNOSIS — J45909 Unspecified asthma, uncomplicated: Secondary | ICD-10-CM | POA: Insufficient documentation

## 2013-08-25 MED ORDER — METHOCARBAMOL 500 MG PO TABS: 500.0000 mg | ORAL_TABLET | Freq: Two times a day (BID) | ORAL | Status: DC

## 2013-08-25 MED ORDER — IBUPROFEN 400 MG PO TABS
400.0000 mg | ORAL_TABLET | Freq: Once | ORAL | Status: AC
  Administered 2013-08-25: 400 mg via ORAL
  Filled 2013-08-25: qty 1

## 2013-08-25 MED ORDER — IBUPROFEN 800 MG PO TABS: 800.0000 mg | ORAL_TABLET | Freq: Three times a day (TID) | ORAL | Status: DC

## 2013-08-25 NOTE — ED Notes (Signed)
Declined W/C at D/C and was escorted to lobby by RN. 

## 2013-08-25 NOTE — ED Notes (Signed)
Pt reportsd as he lifts his arm abvoe shoulder level  His neck hurts 10/10.

## 2013-08-25 NOTE — ED Provider Notes (Signed)
CSN: [id number]     Arrival date & time 08/25/13  [id number] History  This chart was scribed for non-physician practitioner, Fayrene HelperBowie Daxx Tiggs, PA-C, working with Vanetta MuldersScott Zackowski, MD, by Bronson CurbJacqueline Melvin, ED Scribe. This patient was seen in room TR09C/TR09C and the patient's care was started at 7:48 PM.     Chief Complaint  Patient presents with  . Back Pain    The history is provided by the patient. No language interpreter was used.    HPI Comments: Keith Henry is a 38 y.o. male who presents to the Emergency Department complaining of gradually worsening, upper and lower back pain onset 3 weeks ago. Patient states he does heavy lifting at his new job (a [employer] facility) from 50-75lbs. Patient states that in the last 4 days, his pain has been a 10/10. He describes that pain as sharp and aching and states it radiates to his right, posterior neck when he raises his hand above his head. There is associated intermittent tingling sensation of the right arm.Pain is improved upon rest and worsened with movement.  Patient has taken Tylenol with no little to improvement. He denies SOB, numbness/weakness, bowel/bladder incontinence, or urinary symptoms.  Patient denies history of IV drug use.   Past Medical History  Diagnosis Date  . Asthma    Past Surgical History  Procedure Laterality Date  . Skin graft    . Ankle surgery    . Skull fracture elevation      pt unsure of specifics   History reviewed. No pertinent family history. History  Substance Use Topics  . Smoking status: Current Every Day Smoker -- 2.00 packs/day    Types: Cigarettes  . Smokeless tobacco: Not on file  . Alcohol Use: 1.0 oz/week    2 drink(s) per week     Comment: occasion    Review of Systems  Respiratory: Negative for shortness of breath.   Genitourinary: Negative for dysuria and hematuria.  Musculoskeletal: Positive for back pain (lower).  Neurological: Negative for weakness and numbness.       Allergies  Other  Home Medications   Prior to Admission medications   Medication Sig Start Date End Date Taking? Authorizing Provider  albuterol (PROVENTIL HFA;VENTOLIN HFA) 108 (90 BASE) MCG/ACT inhaler Inhale 1 puff into the lungs every 6 (six) hours as needed for wheezing or shortness of breath.    Historical Provider, MD  azithromycin (ZITHROMAX) 250 MG tablet Take 1 tablet (250 mg total) by mouth daily. Take first 2 tablets together, then 1 every day until finished. 06/02/13   Junius FinnerErin O'Malley, PA-C  HYDROcodone-acetaminophen (NORCO/VICODIN) 5-325 MG per tablet Take 1-2 tablets by mouth every 4 (four) hours as needed. 06/02/13   Junius FinnerErin O'Malley, PA-C  meloxicam (MOBIC) 7.5 MG tablet Take 1 tablet (7.5 mg total) by mouth daily. 06/02/13   Junius FinnerErin O'Malley, PA-C   Triage Vitals: BP 126/82  Pulse 90  Temp(Src) 98.2 F (36.8 C) (Oral)  Resp 20  Ht 5\' 10"  (1.778 m)  Wt 212 lb 11.2 oz (96.48 kg)  BMI 30.52 kg/m2  SpO2 100%  Physical Exam  Nursing note and vitals reviewed. Constitutional: He is oriented to person, place, and time. He appears well-developed and well-nourished. No distress.  HENT:  Head: Normocephalic and atraumatic.  Eyes: Conjunctivae and EOM are normal.  Neck: Neck supple. No tracheal deviation present.  Cardiovascular: Normal rate.   Pulmonary/Chest: Effort normal. No respiratory distress.  Musculoskeletal: He exhibits tenderness.  Paralumbar spine tenderness without significant  midline spine tenderness. No crepitus. No stepoffs. TTP along bilatreal trapezius muscles. Right shoulder with decreased shoulder flexion, extension, abduction, adduction. Full passive ROM.  Neurological: He is alert and oriented to person, place, and time.  Skin: Skin is warm and dry. No rash noted.  No rash or signs of infection.  Psychiatric: He has a normal mood and affect. His behavior is normal.    ED Course  Procedures (including critical care time)  DIAGNOSTIC STUDIES: Oxygen  Saturation is 100% on room air, normal by my interpretation.    COORDINATION OF CARE: At 1959 Discussed treatment plan with patient which includes ibuprofen. Patient and I agree there is a low suspicion for fracture, therefore, no x-rays will be ordered. Ortho referral as needed. Patient agrees.   Labs Review Labs Reviewed - No data to display  Imaging Review No results found.   EKG Interpretation None      MDM   Final diagnoses:  Low back strain, initial encounter  Right shoulder strain, initial encounter    BP 126/82  Pulse 90  Temp(Src) 98.2 F (36.8 C) (Oral)  Resp 20  Ht 5\' 10"  (1.778 m)  Wt 212 lb 11.2 oz (96.48 kg)  BMI 30.52 kg/m2  SpO2 100%   I personally performed the services described in this documentation, which was scribed in my presence. The recorded information has been reviewed and is accurate.     Fayrene Helper, PA-C 08/25/13 2028

## 2013-08-25 NOTE — Discharge Instructions (Signed)

## 2013-08-25 NOTE — ED Notes (Signed)
Pt in c/o lower back pain over the last few weeks, history of same, states he does a lot of bending and lifting at work and pain has been increasing, no new specific injury

## 2013-08-26 NOTE — ED Provider Notes (Signed)
Medical screening examination/treatment/procedure(s) were performed by non-physician practitioner and as supervising physician I was immediately available for consultation/collaboration.   EKG Interpretation None        Nakhi Choi, MD 08/26/13 0147 

## 2013-09-01 ENCOUNTER — Emergency Department (HOSPITAL_COMMUNITY)
Admission: EM | Admit: 2013-09-01 | Discharge: 2013-09-01 | Disposition: A | Discharge status: Home / Self Care | Payer: BC Managed Care – PPO | Attending: Emergency Medicine | Admitting: Emergency Medicine

## 2013-09-01 ENCOUNTER — Encounter (HOSPITAL_COMMUNITY): Payer: Self-pay | Admitting: Emergency Medicine

## 2013-09-01 DIAGNOSIS — J4521 Mild intermittent asthma with (acute) exacerbation: Secondary | ICD-10-CM

## 2013-09-01 DIAGNOSIS — R062 Wheezing: Secondary | ICD-10-CM | POA: Insufficient documentation

## 2013-09-01 DIAGNOSIS — J45901 Unspecified asthma with (acute) exacerbation: Secondary | ICD-10-CM | POA: Insufficient documentation

## 2013-09-01 DIAGNOSIS — Z79899 Other long term (current) drug therapy: Secondary | ICD-10-CM | POA: Insufficient documentation

## 2013-09-01 DIAGNOSIS — F17200 Nicotine dependence, unspecified, uncomplicated: Secondary | ICD-10-CM | POA: Insufficient documentation

## 2013-09-01 MED ORDER — IPRATROPIUM BROMIDE 0.02 % IN SOLN
0.5000 mg | Freq: Once | RESPIRATORY_TRACT | Status: AC
  Administered 2013-09-01: 0.5 mg via RESPIRATORY_TRACT
  Filled 2013-09-01: qty 2.5

## 2013-09-01 MED ORDER — PREDNISONE 20 MG PO TABS
60.0000 mg | ORAL_TABLET | Freq: Once | ORAL | Status: AC
  Administered 2013-09-01: 60 mg via ORAL
  Filled 2013-09-01: qty 3

## 2013-09-01 MED ORDER — ALBUTEROL SULFATE HFA 108 (90 BASE) MCG/ACT IN AERS
2.0000 | INHALATION_SPRAY | RESPIRATORY_TRACT | Status: DC | PRN
  Administered 2013-09-01: 2 via RESPIRATORY_TRACT
  Filled 2013-09-01: qty 6.7

## 2013-09-01 MED ORDER — ALBUTEROL SULFATE (2.5 MG/3ML) 0.083% IN NEBU
5.0000 mg | INHALATION_SOLUTION | Freq: Once | RESPIRATORY_TRACT | Status: AC
  Administered 2013-09-01: 5 mg via RESPIRATORY_TRACT
  Filled 2013-09-01: qty 6

## 2013-09-01 MED ORDER — PREDNISONE 20 MG PO TABS: ORAL_TABLET | ORAL | Status: DC

## 2013-09-01 NOTE — ED Provider Notes (Signed)
CSN: [id number]     Arrival date & time 09/01/13  1627 History  This chart was scribed for Fayrene Helper, PA-C working with Rolland Porter, MD by Evon Slack, ED Scribe. This patient was seen in room TR10C/TR10C and the patient's care was started at 5:00 PM.    Chief Complaint  Patient presents with  . Wheezing   Patient is a 38 y.o. male presenting with wheezing. The history is provided by the patient. No language interpreter was used.  Wheezing Associated symptoms: cough   Associated symptoms: no fever and no rhinorrhea    HPI Comments: Keith Henry is a 38 y.o. male with hx asthma who presents to the Emergency Department complaining of gradually worsening intermittent wheezing onset 3 days prior. He states that he has associated cough. He states that he doesn't have an inhaler. Pt states he has been trying controlled breathing with no relief. He states that his symptoms may have started from being at work in the heat and the dirt in the air. States that he has hx of asthma. He states he recently stopped smoking 1 week prior. He denies fever and rhinorrhea.  Pt denies any prior hx of intubation or ICU stay.         Past Medical History  Diagnosis Date  . Asthma    Past Surgical History  Procedure Laterality Date  . Skin graft    . Ankle surgery    . Skull fracture elevation      pt unsure of specifics   History reviewed. No pertinent family history. History  Substance Use Topics  . Smoking status: Current Every Day Smoker -- 2.00 packs/day    Types: Cigarettes  . Smokeless tobacco: Not on file  . Alcohol Use: 1.0 oz/week    2 drink(s) per week     Comment: occasion    Review of Systems  Constitutional: Negative for fever.  HENT: Negative for rhinorrhea.   Respiratory: Positive for cough and wheezing.       Allergies  Other  Home Medications   Prior to Admission medications   Medication Sig Start Date End Date Taking? Authorizing Provider  albuterol (PROVENTIL  HFA;VENTOLIN HFA) 108 (90 BASE) MCG/ACT inhaler Inhale 1 puff into the lungs every 6 (six) hours as needed for wheezing or shortness of breath.   Yes Historical Provider, MD   Triage Vitals: BP 131/73  Pulse 81  Temp(Src) 97.9 F (36.6 C) (Oral)  Resp 18  SpO2 100%  Physical Exam  Nursing note and vitals reviewed. Constitutional: He is oriented to person, place, and time. He appears well-developed and well-nourished. No distress.  HENT:  Head: Normocephalic and atraumatic.  Eyes: Conjunctivae and EOM are normal.  Neck: Neck supple. No tracheal deviation present.  Cardiovascular: Normal rate.   Pulmonary/Chest: Effort normal. He has wheezes.  decreased lung sounds with inspiratory and expiratory wheezes, no stridor   No tachycardia or tachypnea.  Speaking in complete sentences.    Musculoskeletal: Normal range of motion.  Neurological: He is alert and oriented to person, place, and time.  Skin: Skin is warm and dry.  Psychiatric: He has a normal mood and affect. His behavior is normal.    ED Course  Procedures (including critical care time) DIAGNOSTIC STUDIES: Oxygen Saturation is 100% on RA, normal by my interpretation.    COORDINATION OF CARE: 5:09 PM-Pt here with asthma exacerbation, likely 2/2 to inhaling dust and being in hot environment at work.  No evidence of infection.  Pt is actively wheezing. Discussed treatment plan which includes breathing treatment with pt at bedside and pt agreed to plan.   6:06 PM Pt felt much better after breathing treatment.  On reexamination, no active wheezing.  Pt ambulate with 100% O2 on RA.  Stable for discharge with steroid and rescue inhaler.    Labs Review Labs Reviewed - No data to display  Imaging Review No results found.   EKG Interpretation None      MDM   Final diagnoses:  Asthma exacerbation attacks, mild intermittent    BP 131/73  Pulse 81  Temp(Src) 97.9 F (36.6 C) (Oral)  Resp 18  SpO2 100%   I  personally performed the services described in this documentation, which was scribed in my presence. The recorded information has been reviewed and is accurate.       Fayrene HelperBowie Damier Disano, PA-C 09/01/13 1807

## 2013-09-01 NOTE — Discharge Instructions (Signed)

## 2013-09-01 NOTE — ED Notes (Signed)
Presents with Asthma exacerbation began 3 days ago-trying to control at home with no relief. Is out of asthma meds at home. Bilateral expiratory wheezes auscultated. Speaking in full sentences. sats 100%

## 2013-09-06 NOTE — ED Provider Notes (Signed)
Medical screening examination/treatment/procedure(s) were performed by non-physician practitioner and as supervising physician I was immediately available for consultation/collaboration.   EKG Interpretation None        Rolland PorterMark Jakarie Pember, MD 09/06/13 [phone]

## 2013-09-18 ENCOUNTER — Encounter (HOSPITAL_COMMUNITY): Payer: Self-pay | Admitting: Emergency Medicine

## 2013-09-18 ENCOUNTER — Emergency Department (HOSPITAL_COMMUNITY)
Admission: EM | Admit: 2013-09-18 | Discharge: 2013-09-18 | Disposition: A | Discharge status: Home / Self Care | Payer: BC Managed Care – PPO | Attending: Emergency Medicine | Admitting: Emergency Medicine

## 2013-09-18 DIAGNOSIS — F17200 Nicotine dependence, unspecified, uncomplicated: Secondary | ICD-10-CM | POA: Diagnosis not present

## 2013-09-18 DIAGNOSIS — J9801 Acute bronchospasm: Secondary | ICD-10-CM

## 2013-09-18 DIAGNOSIS — J45909 Unspecified asthma, uncomplicated: Secondary | ICD-10-CM | POA: Insufficient documentation

## 2013-09-18 DIAGNOSIS — R0602 Shortness of breath: Secondary | ICD-10-CM | POA: Insufficient documentation

## 2013-09-18 DIAGNOSIS — R9431 Abnormal electrocardiogram [ECG] [EKG]: Secondary | ICD-10-CM

## 2013-09-18 DIAGNOSIS — R9389 Abnormal findings on diagnostic imaging of other specified body structures: Secondary | ICD-10-CM | POA: Insufficient documentation

## 2013-09-18 MED ORDER — ALBUTEROL SULFATE HFA 108 (90 BASE) MCG/ACT IN AERS
2.0000 | INHALATION_SPRAY | Freq: Once | RESPIRATORY_TRACT | Status: AC
  Administered 2013-09-18: 2 via RESPIRATORY_TRACT
  Filled 2013-09-18: qty 6.7

## 2013-09-18 MED ORDER — IPRATROPIUM-ALBUTEROL 0.5-2.5 (3) MG/3ML IN SOLN
3.0000 mL | Freq: Once | RESPIRATORY_TRACT | Status: AC
  Administered 2013-09-18: 3 mL via RESPIRATORY_TRACT
  Filled 2013-09-18: qty 3

## 2013-09-18 MED ORDER — AEROCHAMBER PLUS FLO-VU MEDIUM MISC
1.0000 | Freq: Once | Status: DC
  Administered 2013-09-18: 1

## 2013-09-18 NOTE — ED Provider Notes (Signed)
CSN: [id number]     Arrival date & time 09/18/13  1056 History   First MD Initiated Contact with Patient 09/18/13 1115     Chief Complaint  Patient presents with  . Shortness of Breath     (Consider location/radiation/quality/duration/timing/severity/associated sxs/prior Treatment) Patient is a 38 y.o. male presenting with shortness of breath. The history is provided by the patient.  Shortness of Breath  He began to have trouble with shortness of breath last night. He had a similar problem one week ago and was treated with an inhaler and a prescription for Prednisone. He continues to smoke cigarettes. He denies fever, chills, nausea or vomiting, chest pain, weakness, or dizziness. There are no other known modifying factors.  Past Medical History  Diagnosis Date  . Asthma    Past Surgical History  Procedure Laterality Date  . Skin graft    . Ankle surgery    . Skull fracture elevation      pt unsure of specifics   No family history on file. History  Substance Use Topics  . Smoking status: Current Every Day Smoker -- 2.00 packs/day    Types: Cigarettes  . Smokeless tobacco: Not on file  . Alcohol Use: 1.0 oz/week    2 drink(s) per week     Comment: occasion    Review of Systems  Respiratory: Positive for shortness of breath.   All other systems reviewed and are negative.     Allergies  Other  Home Medications   Prior to Admission medications   Medication Sig Start Date End Date Taking? Authorizing Provider  albuterol (PROVENTIL HFA;VENTOLIN HFA) 108 (90 BASE) MCG/ACT inhaler Inhale 1 puff into the lungs every 6 (six) hours as needed for wheezing or shortness of breath.   Yes Historical Provider, MD   BP 130/76  Pulse 77  Temp(Src) 98.2 F (36.8 C) (Oral)  Resp 18  SpO2 96% Physical Exam  Nursing note and vitals reviewed. Constitutional: He is oriented to person, place, and time. He appears well-developed and well-nourished.  HENT:  Head: Normocephalic and  atraumatic.  Right Ear: External ear normal.  Left Ear: External ear normal.  Eyes: Conjunctivae and EOM are normal. Pupils are equal, round, and reactive to light.  Neck: Normal range of motion and phonation normal. Neck supple.  Cardiovascular: Normal rate, regular rhythm, normal heart sounds and intact distal pulses.   Pulmonary/Chest: Effort normal and breath sounds normal. He has no wheezes. He exhibits no tenderness and no bony tenderness.  Abdominal: Soft. There is no tenderness.  Musculoskeletal: Normal range of motion.  Neurological: He is alert and oriented to person, place, and time. No cranial nerve deficit or sensory deficit. He exhibits normal muscle tone. Coordination normal.  Skin: Skin is warm, dry and intact.  Psychiatric: He has a normal mood and affect. His behavior is normal. Judgment and thought content normal.    ED Course  Procedures (including critical care time)  Medications  ipratropium-albuterol (DUONEB) 0.5-2.5 (3) MG/3ML nebulizer solution 3 mL (3 mLs Nebulization Given 09/18/13 1114)  albuterol (PROVENTIL HFA;VENTOLIN HFA) 108 (90 BASE) MCG/ACT inhaler 2 puff (2 puffs Inhalation Given 09/18/13 1302)    Patient Vitals for the past 24 hrs:  BP Temp Temp src Pulse Resp SpO2  09/18/13 1105 130/76 mmHg 98.2 F (36.8 C) Oral 77 18 96 %       Labs Review Labs Reviewed - No data to display  Imaging Review No results found.   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 Incidental finding of EKG abnormality without clinical significance. Patient is stable for discharge with outpatient management.   Nursing Notes Reviewed/ Care Coordinated Applicable Imaging Reviewed Interpretation of Laboratory Data incorporated into ED treatment  The patient appears reasonably screened and/or stabilized for discharge and I doubt any other medical condition or other [facility] requiring further screening, evaluation, or treatment in the ED at this time prior to discharge.  Plan: Home Medications- Alb. Inhaler; Home Treatments- Stop smokiog; return here if the recommended treatment, does not improve the symptoms; Recommended follow up- PCP f/u 1 week to discuss abnormal EKG    Flint Melter, MD 09/18/13 1346

## 2013-09-18 NOTE — ED Notes (Signed)
Pt states that he started having shob last night. Pt states that EMS went out to his residence and gave him a breathing treatment which helped open his lungs up and was better.  Pt states that when he woke up this morning he is coughing up milky, white phlegm and shob again.

## 2013-09-18 NOTE — Discharge Instructions (Signed)
Try to stop smoking. Use the inhaler, 2 puffs every 3 or 4 hours as needed for cough or trouble breathing. It is important to followup with a doctor about the abnormal EKG, within 4 weeks.    Bronchospasm A bronchospasm is a spasm or tightening of the airways going into the lungs. During a bronchospasm breathing becomes more difficult because the airways get smaller. When this happens there can be coughing, a whistling sound when breathing (wheezing), and difficulty breathing. Bronchospasm is often associated with asthma, but not all patients who experience a bronchospasm have asthma. CAUSES  A bronchospasm is caused by inflammation or irritation of the airways. The inflammation or irritation may be triggered by:   Allergies (such as to animals, pollen, food, or mold). Allergens that cause bronchospasm may cause wheezing immediately after exposure or many hours later.   Infection. Viral infections are believed to be the most common cause of bronchospasm.   Exercise.   Irritants (such as pollution, cigarette smoke, strong odors, aerosol sprays, and paint fumes).   Weather changes. Winds increase molds and pollens in the air. Rain refreshes the air by washing irritants out. Cold air may cause inflammation.   Stress and emotional upset.  SIGNS AND SYMPTOMS   Wheezing.   Excessive nighttime coughing.   Frequent or severe coughing with a simple cold.   Chest tightness.   Shortness of breath.  DIAGNOSIS  Bronchospasm is usually diagnosed through a history and physical exam. Tests, such as chest X-rays, are sometimes done to look for other conditions. TREATMENT   Inhaled medicines can be given to open up your airways and help you breathe. The medicines can be given using either an inhaler or a nebulizer machine.  Corticosteroid medicines may be given for severe bronchospasm, usually when it is associated with asthma. HOME CARE INSTRUCTIONS   Always have a plan prepared  for seeking medical care. Know when to call your health care provider and local emergency services (911 in the [place] Know where you can access local emergency care.  Only take medicines as directed by your health care provider.  If you were prescribed an inhaler or nebulizer machine, ask your health care provider to explain how to use it correctly. Always use a spacer with your inhaler if you were given one.  It is necessary to remain calm during an attack. Try to relax and breathe more slowly.  Control your home environment in the following ways:   Change your heating and air conditioning filter at least once a month.   Limit your use of fireplaces and wood stoves.  Do not smoke and do not allow smoking in your home.   Avoid exposure to perfumes and fragrances.   Get rid of pests (such as roaches and mice) and their droppings.   Throw away plants if you see mold on them.   Keep your house clean and dust free.   Replace carpet with wood, tile, or vinyl flooring. Carpet can trap dander and dust.   Use allergy-proof pillows, mattress covers, and box spring covers.   Wash bed sheets and blankets every week in hot water and dry them in a dryer.   Use blankets that are made of polyester or cotton.   Wash hands frequently. SEEK MEDICAL CARE IF:   You have muscle aches.   You have chest pain.   The sputum changes from clear or white to yellow, green, gray, or bloody.   The sputum you cough up gets thicker.  There are problems that may be related to the medicine you are given, such as a rash, itching, swelling, or trouble breathing.  SEEK IMMEDIATE MEDICAL CARE IF:   You have worsening wheezing and coughing even after taking your prescribed medicines.   You have increased difficulty breathing.   You develop severe chest pain. MAKE SURE YOU:   Understand these instructions.  Will watch your condition.  Will get help right away if you are not doing  well or get worse. Document Released: 01/23/2003 Document Revised: 01/25/2013 Document Reviewed: 07/12/2012 [facility] Patient Information 2015 [employer], [place]. This information is not intended to replace advice given to you by your health care provider. Make sure you discuss any questions you have with your health care provider.

## 2013-09-26 ENCOUNTER — Emergency Department (HOSPITAL_COMMUNITY)
Admission: EM | Admit: 2013-09-26 | Discharge: 2013-09-26 | Disposition: A | Discharge status: Home / Self Care | Payer: BC Managed Care – PPO

## 2013-09-27 ENCOUNTER — Emergency Department (HOSPITAL_COMMUNITY)
Admission: EM | Admit: 2013-09-27 | Discharge: 2013-09-27 | Disposition: A | Discharge status: Home / Self Care | Payer: BC Managed Care – PPO | Attending: Emergency Medicine | Admitting: Emergency Medicine

## 2013-09-27 ENCOUNTER — Emergency Department (HOSPITAL_COMMUNITY): Payer: BC Managed Care – PPO

## 2013-09-27 ENCOUNTER — Encounter (HOSPITAL_COMMUNITY): Payer: Self-pay | Admitting: Emergency Medicine

## 2013-09-27 DIAGNOSIS — F17200 Nicotine dependence, unspecified, uncomplicated: Secondary | ICD-10-CM | POA: Diagnosis not present

## 2013-09-27 DIAGNOSIS — J45901 Unspecified asthma with (acute) exacerbation: Secondary | ICD-10-CM

## 2013-09-27 LAB — CBC WITH DIFFERENTIAL/PLATELET
Basophils Absolute: 0.1 10*3/uL (ref 0.0–0.1)
Basophils Relative: 1 % (ref 0–1)
Eosinophils Absolute: 0.5 10*3/uL (ref 0.0–0.7)
Eosinophils Relative: 5 % (ref 0–5)
HCT: 40.2 % (ref 39.0–52.0)
Hemoglobin: 13.4 g/dL (ref 13.0–17.0)
LYMPHS PCT: 21 % (ref 12–46)
Lymphs Abs: 2.3 10*3/uL (ref 0.7–4.0)
MCH: 32.4 pg (ref 26.0–34.0)
MCHC: 33.3 g/dL (ref 30.0–36.0)
MCV: 97.1 fL (ref 78.0–100.0)
Monocytes Absolute: 1 10*3/uL (ref 0.1–1.0)
Monocytes Relative: 10 % (ref 3–12)
Neutro Abs: 6.9 10*3/uL (ref 1.7–7.7)
Neutrophils Relative %: 63 % (ref 43–77)
PLATELETS: 288 10*3/uL (ref 150–400)
RBC: 4.14 MIL/uL — AB (ref 4.22–5.81)
RDW: 12.8 % (ref 11.5–15.5)
WBC: 10.8 10*3/uL — AB (ref 4.0–10.5)

## 2013-09-27 LAB — BASIC METABOLIC PANEL
Anion gap: 11 (ref 5–15)
BUN: 10 mg/dL (ref 6–23)
CHLORIDE: 102 meq/L (ref 96–112)
CO2: 26 mEq/L (ref 19–32)
CREATININE: 0.95 mg/dL (ref 0.50–1.35)
Calcium: 9.2 mg/dL (ref 8.4–10.5)
GFR calc non Af Amer: >90 mL/min (ref >90)
Glucose, Bld: 94 mg/dL (ref 70–99)
Potassium: 4.7 mEq/L (ref 3.7–5.3)
SODIUM: 139 meq/L (ref 137–147)

## 2013-09-27 LAB — I-STAT TROPONIN, ED: Troponin i, poc: 0 ng/mL (ref 0.00–0.08)

## 2013-09-27 MED ORDER — IPRATROPIUM BROMIDE 0.02 % IN SOLN
0.5000 mg | Freq: Once | RESPIRATORY_TRACT | Status: AC
  Administered 2013-09-27: 0.5 mg via RESPIRATORY_TRACT
  Filled 2013-09-27: qty 2.5

## 2013-09-27 MED ORDER — ALBUTEROL SULFATE HFA 108 (90 BASE) MCG/ACT IN AERS
2.0000 | INHALATION_SPRAY | RESPIRATORY_TRACT | Status: DC | PRN
  Administered 2013-09-27: 2 via RESPIRATORY_TRACT
  Filled 2013-09-27: qty 6.7

## 2013-09-27 MED ORDER — PREDNISONE 20 MG PO TABS
60.0000 mg | ORAL_TABLET | Freq: Once | ORAL | Status: AC
  Administered 2013-09-27: 60 mg via ORAL
  Filled 2013-09-27: qty 3

## 2013-09-27 MED ORDER — ALBUTEROL SULFATE HFA 108 (90 BASE) MCG/ACT IN AERS: 2.0000 | INHALATION_SPRAY | RESPIRATORY_TRACT | Status: DC | PRN

## 2013-09-27 MED ORDER — ALBUTEROL SULFATE (2.5 MG/3ML) 0.083% IN NEBU
5.0000 mg | INHALATION_SOLUTION | Freq: Once | RESPIRATORY_TRACT | Status: AC
  Administered 2013-09-27: 5 mg via RESPIRATORY_TRACT
  Filled 2013-09-27: qty 6

## 2013-09-27 MED ORDER — PREDNISONE 20 MG PO TABS: 40.0000 mg | ORAL_TABLET | Freq: Every day | ORAL | Status: DC

## 2013-09-27 NOTE — ED Notes (Addendum)
Browning, PA at bedside 

## 2013-09-27 NOTE — ED Notes (Signed)
Patient states his "asthma is acting up".  Patient states its been so hot where he stays that it's been harder to get good breaths.  Patient denies any other respiratory symptoms.   Patient states he has chronic back pain that has been "bothering me for 1 year".

## 2013-09-27 NOTE — Progress Notes (Signed)
  CARE MANAGEMENT ED NOTE 09/27/2013  Patient:  [facility]   Account Number:  [id number]  Date Initiated:  09/27/2013  Documentation initiated by:  Ferdinand Cava  Subjective/Objective Assessment:   38 yo male presenting to the ED with c/o SOB     Subjective/Objective Assessment Detail:     Action/Plan:   Action/Plan Detail:   Anticipated DC Date:       Status Recommendation to Physician:   Result of Recommendation:  Agreed    DC Planning Services  CM consult  PCP issues  Other    Choice offered to / List presented to:  C-1 Patient          Status of service:  Completed, signed off  ED Comments:   ED Comments Detail:  This CM spoke with the patient regarding the ED visit. The patient stated that he has had flare ups with his asthma and does not have an health insurance or PCP right now and that is why he keeps coming in to the ED. This CM spoke with the patient regarding the importance of a PCP especially with a chronic disease such a asthma. This CM provided the patient with the resource guide for the uninsured in addition to a pamphlet for the [facility]. The patient stated that he was given the contact number for the [facility] but he hasn't been able to connect. This CM explained that it is better if the patient goes to the [facility] after discharge from the ED to establish care with a PCP, then he has access to the pharmacy and the financial counselor to apply for the orange card if he is unable to reestablish coverage with his private insurance. This CM also provided the patient with contact information so if he has any questions or concern he can contact this CM for follow up. No other questions or concerns.

## 2013-09-27 NOTE — ED Provider Notes (Signed)
Medical screening examination/treatment/procedure(s) were performed by non-physician practitioner and as supervising physician I was immediately available for consultation/collaboration.   EKG Interpretation None       Geoffery Lyons, MD 09/27/13 [phone]

## 2013-09-27 NOTE — ED Notes (Signed)
Browning at bedside

## 2013-09-27 NOTE — Discharge Instructions (Signed)

## 2013-09-27 NOTE — ED Provider Notes (Signed)
CSN: [id number]     Arrival date & time 09/27/13  1016 History  This chart was scribed for non-physician practitioner Roxy Horseman, PA-C working with Geoffery Lyons, MD by Leone Payor, ED Scribe. This patient was seen in room C25C/C25C and the patient's care was started at 11:55 AM.    Chief Complaint  Patient presents with  . Asthma  . Back Pain    The history is provided by the patient. No language interpreter was used.    HPI Comments: Keith Henry is a 38 y.o. male with past medical history of asthma who presents to the Emergency Department complaining of several days of constant, gradually worsened SOB with associated chest tightness. Patient has a history of asthma and states his "asthma is acting up". He was seen in the ED on 09/18/13 for similar symptoms and was diagnosed with bronchospasms. He is a daily smoker but states he has not smoked for the past 7 days. He denies history of COPD. He denies cough, fever.   Past Medical History  Diagnosis Date  . Asthma    Past Surgical History  Procedure Laterality Date  . Skin graft    . Ankle surgery    . Skull fracture elevation      pt unsure of specifics   No family history on file. History  Substance Use Topics  . Smoking status: Current Every Day Smoker -- 2.00 packs/day    Types: Cigarettes  . Smokeless tobacco: Not on file  . Alcohol Use: 1.0 oz/week    2 drink(s) per week     Comment: occasion    Review of Systems  Constitutional: Negative for fever and chills.  Respiratory: Positive for chest tightness and shortness of breath. Negative for cough.   All other systems reviewed and are negative.     Allergies  Other  Home Medications   Prior to Admission medications   Medication Sig Start Date End Date Taking? Authorizing Provider  albuterol (PROVENTIL HFA;VENTOLIN HFA) 108 (90 BASE) MCG/ACT inhaler Inhale 1 puff into the lungs every 6 (six) hours as needed for wheezing or shortness of breath.    Historical  Provider, MD   BP 151/83  Pulse 95  Temp(Src) 98.3 F (36.8 C) (Oral)  Resp 20  Ht  (1.778 m)  Wt 205 lb (92.987 kg)  BMI 29.41 kg/m2  SpO2 98% Physical Exam  Nursing note and vitals reviewed. Constitutional: He is oriented to person, place, and time. He appears well-developed and well-nourished.  HENT:  Head: Normocephalic and atraumatic.  Cardiovascular: Normal rate, regular rhythm and normal heart sounds.  Exam reveals no gallop and no friction rub.   No murmur heard. Pulmonary/Chest: Effort normal. No respiratory distress. He has wheezes. He has no rales. He exhibits no tenderness.  End expiratory wheezes.   Abdominal: He exhibits no distension.  Neurological: He is alert and oriented to person, place, and time.  Skin: Skin is warm and dry.  Psychiatric: He has a normal mood and affect.    ED Course  Procedures (including critical care time)  DIAGNOSTIC STUDIES: Oxygen Saturation is 98% on RA, normal by my interpretation.    COORDINATION OF CARE: 11:59 AM Will order CXR, lab work, and EKG. Discussed treatment plan with pt at bedside and pt agreed to plan.  Results for orders placed during the hospital encounter of 09/27/13  CBC WITH DIFFERENTIAL      Result Value Ref Range   WBC 10.8 (*) 4.0 - 10.5  K/uL   RBC 4.14 (*) 4.22 - 5.81 MIL/uL   Hemoglobin 13.4  13.0 - 17.0 g/dL   HCT 16.1  09.6 - 04.5 %   MCV 97.1  78.0 - 100.0 fL   MCH 32.4  26.0 - 34.0 pg   MCHC 33.3  30.0 - 36.0 g/dL   RDW 40.9  81.1 - 91.4 %   Platelets 288  150 - 400 K/uL   Neutrophils Relative % 63  43 - 77 %   Neutro Abs 6.9  1.7 - 7.7 K/uL   Lymphocytes Relative 21  12 - 46 %   Lymphs Abs 2.3  0.7 - 4.0 K/uL   Monocytes Relative 10  3 - 12 %   Monocytes Absolute 1.0  0.1 - 1.0 K/uL   Eosinophils Relative 5  0 - 5 %   Eosinophils Absolute 0.5  0.0 - 0.7 K/uL   Basophils Relative 1  0 - 1 %   Basophils Absolute 0.1  0.0 - 0.1 K/uL  BASIC METABOLIC PANEL      Result Value Ref Range    Sodium 139  137 - 147 mEq/L   Potassium 4.7  3.7 - 5.3 mEq/L   Chloride 102  96 - 112 mEq/L   CO2 26  19 - 32 mEq/L   Glucose, Bld 94  70 - 99 mg/dL   BUN 10  6 - 23 mg/dL   Creatinine, Ser 7.82  0.50 - 1.35 mg/dL   Calcium 9.2  8.4 - 95.6 mg/dL   GFR calc non Af Amer >90  >90 mL/min   GFR calc Af Amer >90  >90 mL/min   Anion gap 11  5 - 15  I-STAT TROPOININ, ED      Result Value Ref Range   Troponin i, poc 0.00  0.00 - 0.08 ng/mL   Comment 3            Dg Chest 2 View  09/27/2013   CLINICAL DATA:  Shortness of breath, asthma, cough  EXAM: CHEST  2 VIEW  COMPARISON:  06/02/2013  FINDINGS: Lungs are clear.  No pleural effusion or pneumothorax.  The heart is normal in size.  Visualized osseous structures are within normal limits.  IMPRESSION: No evidence of acute cardiopulmonary disease.   Electronically Signed   By: Charline Bills M.D.   On: 09/27/2013 13:20    Medications  ipratropium (ATROVENT) nebulizer solution 0.5 mg (0.5 mg Nebulization Given 09/27/13 1215)  albuterol (PROVENTIL) (2.5 MG/3ML) 0.083% nebulizer solution 5 mg (5 mg Nebulization Given 09/27/13 1215)  predniSONE (DELTASONE) tablet 60 mg (60 mg Oral Given 09/27/13 1215)  albuterol (PROVENTIL) (2.5 MG/3ML) 0.083% nebulizer solution 5 mg (5 mg Nebulization Given 09/27/13 1350)  ipratropium (ATROVENT) nebulizer solution 0.5 mg (0.5 mg Nebulization Given 09/27/13 1350)    Labs Review Labs Reviewed - No data to display  Imaging Review No results found.   EKG Interpretation None     ED ECG REPORT  I personally interpreted this EKG   Date: 09/27/2013   Rate: 62  Rhythm: normal sinus rhythm  QRS Axis: normal  Intervals: normal  ST/T Wave abnormalities: nonspecific ST/T changes  Conduction Disutrbances:none  Narrative Interpretation:   Old EKG Reviewed: unchanged from previous reviewed with Dr. Judd Lien.    MDM   Final diagnoses:  Asthma exacerbation    Patient with asthma exacerbation.  Chest tightness 2/2  to asthma.  Good improvement with nebs.  Afebrile.  Feels better.  Will discharge  with inhaler and prednisone.  Doubt cardiac disease.  Negative troponin, normal CXR, and unchanged EKG.   Patient ambulated in ED with O2 saturations maintained >90, no current signs of respiratory distress. Lung exam improved after nebulizer treatment. Prednisone given in the ED and pt will bd dc with 5 day burst. Pt states they are breathing at baseline. Pt has been instructed to continue using prescribed medications and to speak with PCP about today's exacerbation.   I personally performed the services described in this documentation, which was scribed in my presence. The recorded information has been reviewed and is accurate.     Roxy Horseman, PA-C 09/27/13 1530

## 2013-10-14 ENCOUNTER — Encounter (HOSPITAL_COMMUNITY): Payer: Self-pay | Admitting: Emergency Medicine

## 2013-10-14 ENCOUNTER — Emergency Department (HOSPITAL_COMMUNITY)
Admission: EM | Admit: 2013-10-14 | Discharge: 2013-10-14 | Disposition: A | Discharge status: Home / Self Care | Payer: BC Managed Care – PPO | Attending: Emergency Medicine | Admitting: Emergency Medicine

## 2013-10-14 DIAGNOSIS — M79609 Pain in unspecified limb: Secondary | ICD-10-CM | POA: Diagnosis not present

## 2013-10-14 DIAGNOSIS — IMO0002 Reserved for concepts with insufficient information to code with codable children: Secondary | ICD-10-CM | POA: Insufficient documentation

## 2013-10-14 DIAGNOSIS — IMO0001 Reserved for inherently not codable concepts without codable children: Secondary | ICD-10-CM | POA: Insufficient documentation

## 2013-10-14 DIAGNOSIS — M549 Dorsalgia, unspecified: Secondary | ICD-10-CM | POA: Insufficient documentation

## 2013-10-14 DIAGNOSIS — J45909 Unspecified asthma, uncomplicated: Secondary | ICD-10-CM | POA: Diagnosis not present

## 2013-10-14 DIAGNOSIS — Z79899 Other long term (current) drug therapy: Secondary | ICD-10-CM | POA: Insufficient documentation

## 2013-10-14 DIAGNOSIS — F17200 Nicotine dependence, unspecified, uncomplicated: Secondary | ICD-10-CM | POA: Insufficient documentation

## 2013-10-14 DIAGNOSIS — M791 Myalgia, unspecified site: Secondary | ICD-10-CM

## 2013-10-14 LAB — BASIC METABOLIC PANEL
Anion gap: 12 (ref 5–15)
BUN: 18 mg/dL (ref 6–23)
CALCIUM: 9.1 mg/dL (ref 8.4–10.5)
CO2: 25 meq/L (ref 19–32)
CREATININE: 0.94 mg/dL (ref 0.50–1.35)
Chloride: 101 mEq/L (ref 96–112)
GFR calc Af Amer: >90 mL/min (ref >90)
GLUCOSE: 101 mg/dL — AB (ref 70–99)
Potassium: 4.9 mEq/L (ref 3.7–5.3)
Sodium: 138 mEq/L (ref 137–147)

## 2013-10-14 MED ORDER — SODIUM CHLORIDE 0.9 % IV BOLUS (SEPSIS)
1000.0000 mL | Freq: Once | INTRAVENOUS | Status: AC
  Administered 2013-10-14: 1000 mL via INTRAVENOUS

## 2013-10-14 MED ORDER — NAPROXEN 500 MG PO TABS
500.0000 mg | ORAL_TABLET | Freq: Two times a day (BID) | ORAL | Status: DC
Start: 2013-10-14 — End: 2016-07-06

## 2013-10-14 MED ORDER — KETOROLAC TROMETHAMINE 15 MG/ML IJ SOLN
15.0000 mg | Freq: Once | INTRAMUSCULAR | Status: AC
  Administered 2013-10-14: 15 mg via INTRAVENOUS
  Filled 2013-10-14: qty 1

## 2013-10-14 NOTE — ED Notes (Signed)
Pt reports bilateral leg cramping/soreness x 3-4 days. States that he has tried to increase banana and fluid intake with no relief. Reports having to walk home (approx 10 miles) from work place 3 days ago and started having leg cramping /soreness afterwards.

## 2013-10-14 NOTE — Discharge Instructions (Signed)
Please read and follow all provided instructions.  Your diagnoses today include:  1. Myalgia     Tests performed today include:  Blood counts and electrolytes, kidney function - normal  Vital signs. See below for your results today.   Medications prescribed:   Naproxen - anti-inflammatory pain medication  Do not exceed  naproxen every 12 hours, take with food  You have been prescribed an anti-inflammatory medication or NSAID. Take with food. Take smallest effective dose for the shortest duration needed for your pain. Stop taking if you experience stomach pain or vomiting.   Take any prescribed medications only as directed.  Home care instructions:  Follow any educational materials contained in this packet.  Hydrate well and rest for the next several days.   Follow-up instructions: Please follow-up with your primary care provider in the next 7 days for further evaluation of your symptoms if not improved.   Return instructions:   Please return to the Emergency Department if you experience worsening symptoms.   Please return if you have any other emergent concerns.  Additional Information:  Your vital signs today were: BP 121/74   Pulse 71   Temp(Src) 98.2 F (36.8 C) (Oral)   Resp 14   SpO2 96% If your blood pressure (BP) was elevated above 135/85 this visit, please have this repeated by your doctor within one month. --------------

## 2013-10-14 NOTE — ED Provider Notes (Signed)
[id number]     Arrival date & time 10/14/13  0234 History   First MD Initiated Contact with Patient 10/14/13 0406     No chief complaint on file. CC: muscle pain and spasm   (Consider location/radiation/quality/duration/timing/severity/associated sxs/prior Treatment) HPI Comments: Patient with history of asthma presents with complaint of muscle and muscle spasms for 3 days. Patient complains of aching in his arms, legs, back. Patient states that he works in the heat and sweats a lot. He was concerned that he may be dehydrated. Patient he started eating more bananas at home to help his muscle cramps but this has not improved the symptoms. Patient also reports walking approximately 10 miles from work to home over the past several days. He denies any new medications. Patient has been on prednisone but states he has not taken in 3 weeks. No fevers, URI symptoms, nausea or vomiting, or diarrhea. He states he's been eating and drinking well. Onset of symptoms acute. Course is intermittent. Nothing makes symptoms better or worse. No urine changes, including dark urine.   The history is provided by the patient and medical records.    Past Medical History  Diagnosis Date  . Asthma    Past Surgical History  Procedure Laterality Date  . Skin graft    . Ankle surgery    . Skull fracture elevation      pt unsure of specifics   No family history on file. History  Substance Use Topics  . Smoking status: Current Every Day Smoker -- 2.00 packs/day    Types: Cigarettes  . Smokeless tobacco: Not on file  . Alcohol Use: 1.0 oz/week    2 drink(s) per week     Comment: occasion    Review of Systems  Constitutional: Negative for fever.  HENT: Negative for rhinorrhea and sore throat.   Eyes: Negative for redness.  Respiratory: Negative for cough.   Cardiovascular: Negative for chest pain.  Gastrointestinal: Negative for nausea, vomiting, abdominal pain and diarrhea.  Genitourinary: Negative  for dysuria.  Musculoskeletal: Positive for back pain and myalgias. Negative for neck pain.  Skin: Negative for rash.  Neurological: Negative for headaches.      Allergies  Other  Home Medications   Prior to Admission medications   Medication Sig Start Date End Date Taking? Authorizing Provider  albuterol (PROVENTIL HFA;VENTOLIN HFA) 108 (90 BASE) MCG/ACT inhaler Inhale 1 puff into the lungs every 6 (six) hours as needed for wheezing or shortness of breath.    Historical Provider, MD  albuterol (PROVENTIL HFA;VENTOLIN HFA) 108 (90 BASE) MCG/ACT inhaler Inhale 2 puffs into the lungs every 4 (four) hours as needed for wheezing or shortness of breath. 09/27/13   Roxy Horseman, PA-C  predniSONE (DELTASONE) 20 MG tablet Take 2 tablets (40 mg total) by mouth daily. 09/27/13   Roxy Horseman, PA-C   BP 129/88  Pulse 78  Temp(Src) 98.2 F (36.8 C) (Oral)  Resp 14  SpO2 99% Physical Exam  Nursing note and vitals reviewed. Constitutional: He appears well-developed and well-nourished.  HENT:  Head: Normocephalic and atraumatic.  Eyes: Conjunctivae are normal. Right eye exhibits no discharge. Left eye exhibits no discharge.  Neck: Normal range of motion. Neck supple.  Cardiovascular: Normal rate, regular rhythm and normal heart sounds.   Pulmonary/Chest: Effort normal and breath sounds normal.  Abdominal: Soft. There is no tenderness.  Musculoskeletal: He exhibits no edema.  Neurological: He is alert.  Skin: Skin is warm and dry.  Psychiatric: He has  a normal mood and affect.    ED Course  Procedures (including critical care time) Labs Review Labs Reviewed  BASIC METABOLIC PANEL - Abnormal; Notable for the following:    Glucose, Bld 101 (*)    All other components within normal limits    Imaging Review No results found.   EKG Interpretation None      Patient seen and examined. Work-up initiated. Medications ordered.   Vital signs reviewed and are as follows: BP  129/88  Pulse 78  Temp(Src) 98.2 F (36.8 C) (Oral)  Resp 14  SpO2 99%  5:46 AM BMP normal. Pt informed. He feels better with fluids and toradol.   Encouraged hydration and rest.   Patient urged to return with worsening symptoms or other concerns. Patient verbalized understanding and agrees with plan.    MDM   Final diagnoses:  Myalgia   Muscle pain and spasm: I suspect this is from patient's exertion at work and walking. BMP neg. No history consistent with myositis or rhabdo and I do not feel further eval warranted at this time. He is not on any medications which would cause this. Pt appears well. Will d/c to home with NSAIDs and PCP f/u as needed.   No dangerous or life-threatening conditions suspected or identified by history, physical exam, and by work-up. No indications for hospitalization identified.      Renne Crigler, PA-C 10/14/13 [phone]

## 2013-10-14 NOTE — ED Provider Notes (Signed)
Medical screening examination/treatment/procedure(s) were performed by non-physician practitioner and as supervising physician I was immediately available for consultation/collaboration.   EKG Interpretation None        Lyanne Co, MD 10/14/13 [phone]

## 2013-10-14 NOTE — ED Notes (Signed)
Pt. reports muscle aches/cramps at legs/arms and hands for 3 days .

## 2013-12-26 ENCOUNTER — Encounter (HOSPITAL_COMMUNITY): Payer: Self-pay | Admitting: Emergency Medicine

## 2013-12-26 ENCOUNTER — Emergency Department (HOSPITAL_COMMUNITY)
Admission: EM | Admit: 2013-12-26 | Discharge: 2013-12-26 | Disposition: A | Discharge status: Home / Self Care | Payer: BC Managed Care – PPO | Attending: Emergency Medicine | Admitting: Emergency Medicine

## 2013-12-26 ENCOUNTER — Emergency Department (HOSPITAL_COMMUNITY): Payer: BC Managed Care – PPO

## 2013-12-26 DIAGNOSIS — Z720 Tobacco use: Secondary | ICD-10-CM | POA: Insufficient documentation

## 2013-12-26 DIAGNOSIS — J410 Simple chronic bronchitis: Secondary | ICD-10-CM

## 2013-12-26 DIAGNOSIS — J45901 Unspecified asthma with (acute) exacerbation: Secondary | ICD-10-CM | POA: Insufficient documentation

## 2013-12-26 DIAGNOSIS — Z791 Long term (current) use of non-steroidal anti-inflammatories (NSAID): Secondary | ICD-10-CM | POA: Insufficient documentation

## 2013-12-26 DIAGNOSIS — Z79899 Other long term (current) drug therapy: Secondary | ICD-10-CM | POA: Insufficient documentation

## 2013-12-26 LAB — BASIC METABOLIC PANEL
Anion gap: 13 (ref 5–15)
BUN: 12 mg/dL (ref 6–23)
CALCIUM: 9.3 mg/dL (ref 8.4–10.5)
CO2: 24 meq/L (ref 19–32)
Chloride: 106 mEq/L (ref 96–112)
Creatinine, Ser: 0.94 mg/dL (ref 0.50–1.35)
GFR calc Af Amer: >90 mL/min (ref >90)
Glucose, Bld: 94 mg/dL (ref 70–99)
Potassium: 4.1 mEq/L (ref 3.7–5.3)
SODIUM: 143 meq/L (ref 137–147)

## 2013-12-26 LAB — CBC
HEMATOCRIT: 41.8 % (ref 39.0–52.0)
Hemoglobin: 14.2 g/dL (ref 13.0–17.0)
MCH: 32.9 pg (ref 26.0–34.0)
MCHC: 34 g/dL (ref 30.0–36.0)
MCV: 96.8 fL (ref 78.0–100.0)
Platelets: 307 10*3/uL (ref 150–400)
RBC: 4.32 MIL/uL (ref 4.22–5.81)
RDW: 12.6 % (ref 11.5–15.5)
WBC: 10.3 10*3/uL (ref 4.0–10.5)

## 2013-12-26 MED ORDER — AEROCHAMBER Z-STAT PLUS/MEDIUM MISC
1.0000 | Freq: Once | Status: AC
  Administered 2013-12-26: 1
  Filled 2013-12-26: qty 1

## 2013-12-26 MED ORDER — PREDNISONE 20 MG PO TABS
60.0000 mg | ORAL_TABLET | Freq: Once | ORAL | Status: AC
  Administered 2013-12-26: 60 mg via ORAL
  Filled 2013-12-26: qty 3

## 2013-12-26 MED ORDER — PREDNISONE 10 MG PO TABS: 20.0000 mg | ORAL_TABLET | Freq: Every day | ORAL | Status: DC

## 2013-12-26 MED ORDER — IPRATROPIUM BROMIDE 0.02 % IN SOLN
0.5000 mg | Freq: Once | RESPIRATORY_TRACT | Status: AC
  Administered 2013-12-26: 0.5 mg via RESPIRATORY_TRACT
  Filled 2013-12-26: qty 2.5

## 2013-12-26 MED ORDER — ALBUTEROL SULFATE HFA 108 (90 BASE) MCG/ACT IN AERS
2.0000 | INHALATION_SPRAY | RESPIRATORY_TRACT | Status: DC
  Administered 2013-12-26: 2 via RESPIRATORY_TRACT
  Filled 2013-12-26: qty 6.7

## 2013-12-26 MED ORDER — ALBUTEROL SULFATE (2.5 MG/3ML) 0.083% IN NEBU
5.0000 mg | INHALATION_SOLUTION | Freq: Once | RESPIRATORY_TRACT | Status: AC
  Administered 2013-12-26: 5 mg via RESPIRATORY_TRACT
  Filled 2013-12-26: qty 6

## 2013-12-26 NOTE — ED Provider Notes (Signed)
CSN: [id number]     Arrival date & time 12/26/13  1729 History   First MD Initiated Contact with Patient 12/26/13 2051     Chief Complaint  Patient presents with  . Shortness of Breath     (Consider location/radiation/quality/duration/timing/severity/associated sxs/prior Treatment) HPI Comments: Patient here complaining of increasing trouble breathing 2 weeks. Has a history of asthma and ran out of his inhaler. 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 Symptoms are worse at night. Cough has been nonproductive. No fever or chills. No vomiting or diarrhea. No anginal type chest pain. No CHF symptoms. Symptoms are better throughout the day. No treatment use prior to arrival  Patient is a 38 y.o. male presenting with shortness of breath. The history is provided by the patient.  Shortness of Breath   Past Medical History  Diagnosis Date  . Asthma    Past Surgical History  Procedure Laterality Date  . Skin graft    . Ankle surgery    . Skull fracture elevation      pt unsure of specifics   History reviewed. No pertinent family history. History  Substance Use Topics  . Smoking status: Current Every Day Smoker -- 2.00 packs/day    Types: Cigarettes  . Smokeless tobacco: Not on file  . Alcohol Use: 1.0 oz/week    2 drink(s) per week     Comment: occasion    Review of Systems  Respiratory: Positive for shortness of breath.   All other systems reviewed and are negative.     Allergies  Other  Home Medications   Prior to Admission medications   Medication Sig Start Date End Date Taking? Authorizing Provider  albuterol (PROVENTIL HFA;VENTOLIN HFA) 108 (90 BASE) MCG/ACT inhaler Inhale 2 puffs into the lungs every 4 (four) hours as needed for wheezing or shortness of breath. 09/27/13  Yes Roxy Horsemanobert Browning, PA-C  naproxen (NAPROSYN) 500 MG tablet Take 1 tablet (500 mg total) by mouth 2 (two) times daily. 10/14/13    Renne CriglerJoshua Geiple, PA-C   BP 128/93 mmHg  Pulse 73  Temp(Src) 98.4 F (36.9 C) (Oral)  Resp 16  SpO2 99% Physical Exam  Constitutional: He is oriented to person, place, and time. He appears well-developed and well-nourished.  Non-toxic appearance. No distress.  HENT:  Head: Normocephalic and atraumatic.  Eyes: Conjunctivae, EOM and lids are normal. Pupils are equal, round, and reactive to light.  Neck: Normal range of motion. Neck supple. No tracheal deviation present. No thyroid mass present.  Cardiovascular: Normal rate, regular rhythm and normal heart sounds.  Exam reveals no gallop.   No murmur heard. Pulmonary/Chest: Effort normal. No stridor. No respiratory distress. He has decreased breath sounds. He has wheezes. He has no rhonchi. He has no rales.  Abdominal: Soft. Normal appearance and bowel sounds are normal. He exhibits no distension. There is no tenderness. There is no rebound and no CVA tenderness.  Musculoskeletal: Normal range of motion. He exhibits no edema or tenderness.  Neurological: He is alert and oriented to person, place, and time. He has normal strength. No cranial nerve deficit or sensory deficit. GCS eye subscore is 4. GCS verbal subscore is 5. GCS motor subscore is 6.  Skin: Skin is warm and dry. No abrasion and no rash noted.  Psychiatric: He has a normal mood and affect. His speech is normal and behavior is normal.  Nursing note and vitals reviewed.   ED Course  Procedures (including critical care time) Labs Review Labs Reviewed  BASIC METABOLIC PANEL  CBC    Imaging Review Dg Chest 2 View (if Patient Has Fever And/or Copd)  12/26/2013   CLINICAL DATA:  Pt c/o cough, congestion, sob x 2 wks, hx asthma, pt reports he quit smoking x 2 wks ago, with onset of illness  EXAM: CHEST  2 VIEW  COMPARISON:  09/27/2013 and 06/02/2013  FINDINGS: Midline trachea. Normal heart size and mediastinal contours. No pleural effusion or pneumothorax. Mild peribronchial  thickening. No lobar consolidation.  IMPRESSION: 1.  No acute cardiopulmonary disease. 2. Mild peribronchial thickening which may relate to chronic bronchitis or smoking.   Electronically Signed   By: Jeronimo GreavesKyle  Talbot M.D.   On: 12/26/2013 18:31     EKG Interpretation None      MDM   Final diagnoses:  None    Patient's chest x-ray is consistent with his prior history of asthma. Will give albuterol with Atrovent treatment here. We'll also give prednisone orally. Will prescribe patient 5 day course of prednisone as well as give albuterol inhaler to go home with    Toy BakerAnthony T Tonye Tancredi, MD 12/26/13 2122

## 2013-12-26 NOTE — ED Notes (Signed)
Pt states "I can't breath." Pt speaking in complete sentences on phone. Pt reports asthma flare-up for past 2 weeks. 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 Also reports R arm/shoulder pain.

## 2013-12-26 NOTE — ED Notes (Signed)
Awake. Verbally responsive. Resp even and unlabored. ABC's intact. NAD noted. 

## 2013-12-26 NOTE — Discharge Instructions (Signed)
How to Use an Inhaler Proper inhaler technique is very important. Good technique ensures that the medicine reaches the lungs. Poor technique results in depositing the medicine on the tongue and back of the throat rather than in the airways. If you do not use the inhaler with good technique, the medicine will not help you. STEPS TO FOLLOW IF USING AN INHALER WITHOUT AN EXTENSION TUBE  Remove the cap from the inhaler.  If you are using the inhaler for the first time, you will need to prime it. Shake the inhaler for 5 seconds and release four puffs into the air, away from your face. Ask your health care provider or pharmacist if you have questions about priming your inhaler.  Shake the inhaler for 5 seconds before each breath in (inhalation).  Position the inhaler so that the top of the canister faces up.  Put your index finger on the top of the medicine canister. Your thumb supports the bottom of the inhaler.  Open your mouth.  Either place the inhaler between your teeth and place your lips tightly around the mouthpiece, or hold the inhaler 1-2 inches away from your open mouth. If you are unsure of which technique to use, ask your health care provider.  Breathe out (exhale) normally and as completely as possible.  Press the canister down with your index finger to release the medicine.  At the same time as the canister is pressed, inhale deeply and slowly until your lungs are completely filled. This should take 4-6 seconds. Keep your tongue down.  Hold the medicine in your lungs for 5-10 seconds (10 seconds is best). This helps the medicine get into the small airways of your lungs.  Breathe out slowly, through pursed lips. Whistling is an example of pursed lips.  Wait at least 15-30 seconds between puffs. Continue with the above steps until you have taken the number of puffs your health care provider has ordered. Do not use the inhaler more than your health care provider tells  you.  Replace the cap on the inhaler.  Follow the directions from your health care provider or the inhaler insert for cleaning the inhaler. STEPS TO FOLLOW IF USING AN INHALER WITH AN EXTENSION (SPACER)  Remove the cap from the inhaler.  If you are using the inhaler for the first time, you will need to prime it. Shake the inhaler for 5 seconds and release four puffs into the air, away from your face. Ask your health care provider or pharmacist if you have questions about priming your inhaler.  Shake the inhaler for 5 seconds before each breath in (inhalation).  Place the open end of the spacer onto the mouthpiece of the inhaler.  Position the inhaler so that the top of the canister faces up and the spacer mouthpiece faces you.  Put your index finger on the top of the medicine canister. Your thumb supports the bottom of the inhaler and the spacer.  Breathe out (exhale) normally and as completely as possible.  Immediately after exhaling, place the spacer between your teeth and into your mouth. Close your lips tightly around the spacer.  Press the canister down with your index finger to release the medicine.  At the same time as the canister is pressed, inhale deeply and slowly until your lungs are completely filled. This should take 4-6 seconds. Keep your tongue down and out of the way.  Hold the medicine in your lungs for 5-10 seconds (10 seconds is best). This helps the   medicine get into the small airways of your lungs. Exhale.  Repeat inhaling deeply through the spacer mouthpiece. Again hold that breath for up to 10 seconds (10 seconds is best). Exhale slowly. If it is difficult to take this second deep breath through the spacer, breathe normally several times through the spacer. Remove the spacer from your mouth.  Wait at least 15-30 seconds between puffs. Continue with the above steps until you have taken the number of puffs your health care provider has ordered. Do not use the  inhaler more than your health care provider tells you.  Remove the spacer from the inhaler, and place the cap on the inhaler.  Follow the directions from your health care provider or the inhaler insert for cleaning the inhaler and spacer. If you are using different kinds of inhalers, use your quick relief medicine to open the airways 10-15 minutes before using a steroid if instructed to do so by your health care provider. If you are unsure which inhalers to use and the order of using them, ask your health care provider, nurse, or respiratory therapist. If you are using a steroid inhaler, always rinse your mouth with water after your last puff, then gargle and spit out the water. Do not swallow the water. AVOID:  Inhaling before or after starting the spray of medicine. It takes practice to coordinate your breathing with triggering the spray.  Inhaling through the nose (rather than the mouth) when triggering the spray. HOW TO DETERMINE IF YOUR INHALER IS FULL OR NEARLY EMPTY You cannot know when an inhaler is empty by shaking it. A few inhalers are now being made with dose counters. Ask your health care provider for a prescription that has a dose counter if you feel you need that extra help. If your inhaler does not have a counter, ask your health care provider to help you determine the date you need to refill your inhaler. Write the refill date on a calendar or your inhaler canister. Refill your inhaler 7-10 days before it runs out. Be sure to keep an adequate supply of medicine. This includes making sure it is not expired, and that you have a spare inhaler.  SEEK MEDICAL CARE IF:   Your symptoms are only partially relieved with your inhaler.  You are having trouble using your inhaler.  You have some increase in phlegm. SEEK IMMEDIATE MEDICAL CARE IF:   You feel little or no relief with your inhalers. You are still wheezing and are feeling shortness of breath or tightness in your chest or  both.  You have dizziness, headaches, or a fast heart rate.  You have chills, fever, or night sweats.  You have a noticeable increase in phlegm production, or there is blood in the phlegm. MAKE SURE YOU:  1. Understand these instructions. 2. Will watch your condition. 3. Will get help right away if you are not doing well or get worse. Document Released: 01/18/2000 Document Revised: 11/10/2012 Document Reviewed: 08/19/2012 [employer] Patient Information 2015 [employer]. This information is not intended to replace advice given to you by your health care provider. Make sure you discuss any questions you have with your health care provider.  Chronic Obstructive Pulmonary Disease Chronic obstructive pulmonary disease (COPD) is a common lung condition in which airflow from the lungs is limited. COPD is a general term that can be used to describe many different lung problems that limit airflow, including both chronic bronchitis and emphysema. If you have COPD, your lung function   will probably never return to normal, but there are measures you can take to improve lung function and make yourself feel better.  CAUSES   Smoking (common).   Exposure to secondhand smoke.   Genetic problems.  Chronic inflammatory lung diseases or recurrent infections. SYMPTOMS   Shortness of breath, especially with physical activity.   Deep, persistent (chronic) cough with a large amount of thick mucus.   Wheezing.   Rapid breaths (tachypnea).   Gray or bluish discoloration (cyanosis) of the skin, especially in fingers, toes, or lips.   Fatigue.   Weight loss.   Frequent infections or episodes when breathing symptoms become much worse (exacerbations).   Chest tightness. DIAGNOSIS  Your health care provider will take a medical history and perform a physical examination to make the initial diagnosis. Additional tests for COPD may include:   Lung (pulmonary) function tests.  Chest  X-ray.  CT scan.  Blood tests. TREATMENT  Treatment available to help you feel better when you have COPD includes:   Inhaler and nebulizer medicines. These help manage the symptoms of COPD and make your breathing more comfortable.  Supplemental oxygen. Supplemental oxygen is only helpful if you have a low oxygen level in your blood.   Exercise and physical activity. These are beneficial for nearly all people with COPD. Some people may also benefit from a pulmonary rehabilitation program. HOME CARE INSTRUCTIONS   Take all medicines (inhaled or pills) as directed by your health care provider.  Avoid over-the-counter medicines or cough syrups that dry up your airway (such as antihistamines) and slow down the elimination of secretions unless instructed otherwise by your health care provider.   If you are a smoker, the most important thing that you can do is stop smoking. Continuing to smoke will cause further lung damage and breathing trouble. Ask your health care provider for help with quitting smoking. He or she can direct you to community resources or hospitals that provide support.  Avoid exposure to irritants such as smoke, chemicals, and fumes that aggravate your breathing.  Use oxygen therapy and pulmonary rehabilitation if directed by your health care provider. If you require home oxygen therapy, ask your health care provider whether you should purchase a pulse oximeter to measure your oxygen level at home.   Avoid contact with individuals who have a contagious illness.  Avoid extreme temperature and humidity changes.  Eat healthy foods. Eating smaller, more frequent meals and resting before meals may help you maintain your strength.  Stay active, but balance activity with periods of rest. Exercise and physical activity will help you maintain your ability to do things you want to do.  Preventing infection and hospitalization is very important when you have COPD. Make sure to  receive all the vaccines your health care provider recommends, especially the pneumococcal and influenza vaccines. Ask your health care provider whether you need a pneumonia vaccine.  Learn and use relaxation techniques to manage stress.  Learn and use controlled breathing techniques as directed by your health care provider. Controlled breathing techniques include:   Pursed lip breathing. Start by breathing in (inhaling) through your nose for 1 second. Then, purse your lips as if you were going to whistle and breathe out (exhale) through the pursed lips for 2 seconds.   Diaphragmatic breathing. Start by putting one hand on your abdomen just above your waist. Inhale slowly through your nose. The hand on your abdomen should move out. Then purse your lips and exhale slowly. You should   be able to feel the hand on your abdomen moving in as you exhale.   Learn and use controlled coughing to clear mucus from your lungs. Controlled coughing is a series of short, progressive coughs. The steps of controlled coughing are:  4. Lean your head slightly forward.  5. Breathe in deeply using diaphragmatic breathing.  6. Try to hold your breath for 3 seconds.  7. Keep your mouth slightly open while coughing twice.  8. Spit any mucus out into a tissue.  9. Rest and repeat the steps once or twice as needed. SEEK MEDICAL CARE IF:   You are coughing up more mucus than usual.   There is a change in the color or thickness of your mucus.   Your breathing is more labored than usual.   Your breathing is faster than usual.  SEEK IMMEDIATE MEDICAL CARE IF:   You have shortness of breath while you are resting.   You have shortness of breath that prevents you from:  Being able to talk.   Performing your usual physical activities.   You have chest pain lasting longer than 5 minutes.   Your skin color is more cyanotic than usual.  You measure low oxygen saturations for longer than 5 minutes  with a pulse oximeter. MAKE SURE YOU:   Understand these instructions.  Will watch your condition.  Will get help right away if you are not doing well or get worse. Document Released: 10/30/2004 Document Revised: 06/06/2013 Document Reviewed: 09/16/2012 [employer] Patient Information 2015 [employer]. This information is not intended to replace advice given to you by your health care provider. Make sure you discuss any questions you have with your health care provider.  

## 2014-02-28 ENCOUNTER — Emergency Department (HOSPITAL_COMMUNITY): Payer: No Typology Code available for payment source

## 2014-02-28 ENCOUNTER — Encounter (HOSPITAL_COMMUNITY): Payer: Self-pay | Admitting: Emergency Medicine

## 2014-02-28 ENCOUNTER — Emergency Department (HOSPITAL_COMMUNITY)
Admission: EM | Admit: 2014-02-28 | Discharge: 2014-03-01 | Disposition: A | Discharge status: Home / Self Care | Payer: No Typology Code available for payment source | Attending: Emergency Medicine | Admitting: Emergency Medicine

## 2014-02-28 DIAGNOSIS — Z791 Long term (current) use of non-steroidal anti-inflammatories (NSAID): Secondary | ICD-10-CM | POA: Diagnosis not present

## 2014-02-28 DIAGNOSIS — J45901 Unspecified asthma with (acute) exacerbation: Secondary | ICD-10-CM | POA: Insufficient documentation

## 2014-02-28 DIAGNOSIS — Z720 Tobacco use: Secondary | ICD-10-CM | POA: Diagnosis not present

## 2014-02-28 DIAGNOSIS — Z7952 Long term (current) use of systemic steroids: Secondary | ICD-10-CM | POA: Diagnosis not present

## 2014-02-28 DIAGNOSIS — R079 Chest pain, unspecified: Secondary | ICD-10-CM | POA: Diagnosis present

## 2014-02-28 LAB — CBC
HEMATOCRIT: 41.9 % (ref 39.0–52.0)
HEMOGLOBIN: 14.2 g/dL (ref 13.0–17.0)
MCH: 33 pg (ref 26.0–34.0)
MCHC: 33.9 g/dL (ref 30.0–36.0)
MCV: 97.4 fL (ref 78.0–100.0)
PLATELETS: 274 10*3/uL (ref 150–400)
RBC: 4.3 MIL/uL (ref 4.22–5.81)
RDW: 12.4 % (ref 11.5–15.5)
WBC: 14.9 10*3/uL — ABNORMAL HIGH (ref 4.0–10.5)

## 2014-02-28 LAB — BASIC METABOLIC PANEL
ANION GAP: 8 (ref 5–15)
BUN: 18 mg/dL (ref 6–23)
CO2: 26 mmol/L (ref 19–32)
Calcium: 8.8 mg/dL (ref 8.4–10.5)
Chloride: 103 mmol/L (ref 96–112)
Creatinine, Ser: 1.1 mg/dL (ref 0.50–1.35)
GFR calc Af Amer: >90 mL/min (ref >90)
GFR, EST NON AFRICAN AMERICAN: 83 mL/min — AB (ref >90)
GLUCOSE: 101 mg/dL — AB (ref 70–99)
Potassium: 4.1 mmol/L (ref 3.5–5.1)
SODIUM: 137 mmol/L (ref 135–145)

## 2014-02-28 LAB — I-STAT TROPONIN, ED: Troponin i, poc: 0 ng/mL (ref 0.00–0.08)

## 2014-02-28 NOTE — ED Provider Notes (Signed)
CSN: [id number]     Arrival date & time 02/28/14  2251 History  This chart was scribed for Keith Co, MD by Richarda Overlie, ED Scribe. This patient was seen in room A04C/A04C and the patient's care was started 12:08 AM.    Chief Complaint  Patient presents with  . Chest Pain  . Shortness of Breath   The history is provided by the patient. No language interpreter was used.   HPI Comments: Keith Henry is a 39 y.o. male with a history of asthma who presents to the Emergency Department complaining of worsening SOB that started 2 days ago. Pt states he has been using his inhaler at home and states it failed to relieve his symptoms. He reports associated productive cough with clear phlegm. Pt reports upper abdominal and lower chest pain. He rseports he stopped smoking cigarettes 2 to 3 weeks ago. He denies fever and chills.   Past Medical History  Diagnosis Date  . Asthma    Past Surgical History  Procedure Laterality Date  . Skin graft    . Ankle surgery    . Skull fracture elevation      pt unsure of specifics   No family history on file. History  Substance Use Topics  . Smoking status: Current Every Day Smoker -- 2.00 packs/day    Types: Cigarettes  . Smokeless tobacco: Not on file  . Alcohol Use: 1.0 oz/week    2 drink(s) per week     Comment: occasion    Review of Systems  A complete 10 system review of systems was obtained and all systems are negative except as noted in the HPI and PMH.    Allergies  Other  Home Medications   Prior to Admission medications   Medication Sig Start Date End Date Taking? Authorizing Provider  albuterol (PROVENTIL HFA;VENTOLIN HFA) 108 (90 BASE) MCG/ACT inhaler Inhale 2 puffs into the lungs every 4 (four) hours as needed for wheezing or shortness of breath. 09/27/13   Roxy Horseman, PA-C  naproxen (NAPROSYN) 500 MG tablet Take 1 tablet (500 mg total) by mouth 2 (two) times daily. 10/14/13   Renne Crigler, PA-C  predniSONE  (DELTASONE) 10 MG tablet Take 2 tablets (20 mg total) by mouth daily. 12/26/13   Toy Baker, MD   BP 132/82 mmHg  Pulse 72  Temp(Src) 98.2 F (36.8 C) (Oral)  Resp 24  SpO2 95% Physical Exam  Constitutional: He is oriented to person, place, and time. He appears well-developed and well-nourished.  HENT:  Head: Normocephalic and atraumatic.  Eyes: EOM are normal.  Neck: Normal range of motion.  Cardiovascular: Normal rate, regular rhythm, normal heart sounds and intact distal pulses.   Pulmonary/Chest: No respiratory distress. He has wheezes.  Wheezing bilateral. Decreased breath sounds in the bases.   Abdominal: Soft. He exhibits no distension. There is no tenderness.  Musculoskeletal: Normal range of motion.  Neurological: He is alert and oriented to person, place, and time.  Skin: Skin is warm and dry.  Psychiatric: He has a normal mood and affect. Judgment normal.  Nursing note and vitals reviewed.   ED Course  Procedures   DIAGNOSTIC STUDIES: Oxygen Saturation is 95% on RA, normal by my interpretation.    COORDINATION OF CARE: 12:13 AM Discussed treatment plan with pt at bedside and pt agreed to plan.  1:46 AM Mild improvement. Will receive another breathing treatment now.   Labs Review Labs Reviewed  CBC - Abnormal; Notable for the  following:    WBC 14.9 (*)    All other components within normal limits  BASIC METABOLIC PANEL - Abnormal; Notable for the following:    Glucose, Bld 101 (*)    GFR calc non Af Amer 83 (*)    All other components within normal limits  BRAIN NATRIURETIC PEPTIDE  I-STAT TROPOININ, ED    Imaging Review Dg Chest 2 View  02/28/2014   CLINICAL DATA:  Chest pain and tightness, shortness of breath for 1 week. History of COPD.  EXAM: CHEST  2 VIEW  COMPARISON:  Chest radiograph December 26, 2013  FINDINGS: Cardiomediastinal silhouette is unremarkable. Mild bronchitic changes The lungs are clear without pleural effusions or focal  consolidations. Trachea projects midline and there is no pneumothorax. Similar expanded appearance of the manubrium seen on the lateral radiograph, this could be projectional.  IMPRESSION: Mild bronchitic changes without focal consolidation.  Expanded appearance of the manubrium seen on the lateral radiograph, unchanged. This could be projectional though, recommend correlation with point tenderness and, if clinically indicated CT of the chest on a nonemergent basis.   Electronically Signed   By: Awilda Metroourtnay  Bloomer   On: 02/28/2014 23:50  I personally reviewed the imaging tests through PACS system I reviewed available ER/hospitalization records through the EMR    EKG Interpretation   Date/Time:  Tuesday February 28 2014 22:59:10 EST Ventricular Rate:  94 PR Interval:  146 QRS Duration: 78 QT Interval:  356 QTC Calculation: 445 R Axis:   66 Text Interpretation:  Normal sinus rhythm Nonspecific T wave abnormality  Abnormal ECG No significant change was found Confirmed by Destyne Goodreau  MD,  Caryn BeeKEVIN ([id number]) on 02/28/2014 11:57:17 PM      MDM   Final diagnoses:  Asthma exacerbation   4:47 AM Patient feels much better at this time.  Discharge home in good condition.  Home with steroids and albuterol.  Improved with bronchodilators and prednisone in the ER.   I personally performed the services described in this documentation, which was scribed in my presence. The recorded information has been reviewed and is accurate.        Keith CoKevin M Trajan Grove, MD 03/01/14 [phone]

## 2014-02-28 NOTE — ED Notes (Signed)
Pt. reports mid chest pain with SOB , productive cough / wheezing onset this week , denies nausea or diaphoresis .

## 2014-03-01 LAB — BRAIN NATRIURETIC PEPTIDE: B Natriuretic Peptide: 9.8 pg/mL (ref 0.0–100.0)

## 2014-03-01 MED ORDER — PREDNISONE 20 MG PO TABS
60.0000 mg | ORAL_TABLET | Freq: Once | ORAL | Status: AC
  Administered 2014-03-01: 60 mg via ORAL
  Filled 2014-03-01: qty 3

## 2014-03-01 MED ORDER — IPRATROPIUM BROMIDE 0.02 % IN SOLN
0.5000 mg | Freq: Once | RESPIRATORY_TRACT | Status: AC
  Administered 2014-03-01: 0.5 mg via RESPIRATORY_TRACT
  Filled 2014-03-01: qty 2.5

## 2014-03-01 MED ORDER — ALBUTEROL SULFATE (2.5 MG/3ML) 0.083% IN NEBU
10.0000 mg | INHALATION_SOLUTION | Freq: Once | RESPIRATORY_TRACT | Status: AC
  Administered 2014-03-01: 10 mg via RESPIRATORY_TRACT
  Filled 2014-03-01 (×2): qty 12

## 2014-03-01 MED ORDER — PREDNISONE 10 MG PO TABS: 60.0000 mg | ORAL_TABLET | Freq: Every day | ORAL | Status: DC

## 2014-03-01 MED ORDER — ALBUTEROL SULFATE HFA 108 (90 BASE) MCG/ACT IN AERS
2.0000 | INHALATION_SPRAY | RESPIRATORY_TRACT | Status: DC
  Administered 2014-03-01: 2 via RESPIRATORY_TRACT
  Filled 2014-03-01: qty 6.7

## 2014-03-01 MED ORDER — ALBUTEROL SULFATE (2.5 MG/3ML) 0.083% IN NEBU
2.5000 mg | INHALATION_SOLUTION | Freq: Once | RESPIRATORY_TRACT | Status: AC
  Administered 2014-03-01: 2.5 mg via RESPIRATORY_TRACT
  Filled 2014-03-01: qty 3

## 2014-03-01 NOTE — Discharge Instructions (Signed)

## 2016-06-14 ENCOUNTER — Emergency Department (HOSPITAL_COMMUNITY)
Admission: EM | Admit: 2016-06-14 | Discharge: 2016-06-14 | Disposition: A | Discharge status: Home / Self Care | Payer: No Typology Code available for payment source | Attending: Emergency Medicine | Admitting: Emergency Medicine

## 2016-06-14 ENCOUNTER — Encounter (HOSPITAL_COMMUNITY): Payer: Self-pay

## 2016-06-14 ENCOUNTER — Emergency Department (HOSPITAL_COMMUNITY): Payer: No Typology Code available for payment source

## 2016-06-14 DIAGNOSIS — Z79899 Other long term (current) drug therapy: Secondary | ICD-10-CM | POA: Insufficient documentation

## 2016-06-14 DIAGNOSIS — S99921A Unspecified injury of right foot, initial encounter: Secondary | ICD-10-CM | POA: Insufficient documentation

## 2016-06-14 DIAGNOSIS — Y999 Unspecified external cause status: Secondary | ICD-10-CM | POA: Insufficient documentation

## 2016-06-14 DIAGNOSIS — M79675 Pain in left toe(s): Secondary | ICD-10-CM | POA: Insufficient documentation

## 2016-06-14 DIAGNOSIS — G8929 Other chronic pain: Secondary | ICD-10-CM | POA: Insufficient documentation

## 2016-06-14 DIAGNOSIS — Y939 Activity, unspecified: Secondary | ICD-10-CM | POA: Insufficient documentation

## 2016-06-14 DIAGNOSIS — J45909 Unspecified asthma, uncomplicated: Secondary | ICD-10-CM | POA: Insufficient documentation

## 2016-06-14 DIAGNOSIS — F17210 Nicotine dependence, cigarettes, uncomplicated: Secondary | ICD-10-CM | POA: Insufficient documentation

## 2016-06-14 DIAGNOSIS — Y929 Unspecified place or not applicable: Secondary | ICD-10-CM | POA: Insufficient documentation

## 2016-06-14 NOTE — ED Triage Notes (Signed)
Pt reports injuring is left foot x2 months ago and pain persists. No deformity noted. Pt ambulatory.

## 2016-06-14 NOTE — Discharge Instructions (Signed)
You can try buddy taping your toe for support. Wear open toed shoes or something that has some room for the toes to help with pain.  Ice and elevate foot throughout the day, using ice pack for no more than 20 minutes every hour.  Alternate between tylenol and motrin as needed for pain relief. Follow up with the podiatrist in 1-2 weeks for recheck of symptoms and ongoing management of your toe pain. Return to the ER for changes or worsening symptoms.
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
## 2016-07-06 ENCOUNTER — Emergency Department (HOSPITAL_COMMUNITY)
Admission: EM | Admit: 2016-07-06 | Discharge: 2016-07-06 | Disposition: A | Discharge status: Home / Self Care | Payer: No Typology Code available for payment source | Attending: Emergency Medicine | Admitting: Emergency Medicine

## 2016-07-06 ENCOUNTER — Encounter (HOSPITAL_COMMUNITY): Payer: Self-pay | Admitting: Oncology

## 2016-07-06 DIAGNOSIS — M791 Myalgia, unspecified site: Secondary | ICD-10-CM

## 2016-07-06 DIAGNOSIS — J45909 Unspecified asthma, uncomplicated: Secondary | ICD-10-CM | POA: Insufficient documentation

## 2016-07-06 DIAGNOSIS — E876 Hypokalemia: Secondary | ICD-10-CM | POA: Insufficient documentation

## 2016-07-06 DIAGNOSIS — F17210 Nicotine dependence, cigarettes, uncomplicated: Secondary | ICD-10-CM | POA: Insufficient documentation

## 2016-07-06 LAB — CBC WITH DIFFERENTIAL/PLATELET
BASOS ABS: 0.1 10*3/uL (ref 0.0–0.1)
Basophils Relative: 1 %
EOS PCT: 4 %
Eosinophils Absolute: 0.5 10*3/uL (ref 0.0–0.7)
HCT: 39 % (ref 39.0–52.0)
Hemoglobin: 13.5 g/dL (ref 13.0–17.0)
LYMPHS ABS: 2.6 10*3/uL (ref 0.7–4.0)
LYMPHS PCT: 22 %
MCH: 32.5 pg (ref 26.0–34.0)
MCHC: 34.6 g/dL (ref 30.0–36.0)
MCV: 94 fL (ref 78.0–100.0)
MONO ABS: 0.8 10*3/uL (ref 0.1–1.0)
Monocytes Relative: 7 %
Neutro Abs: 7.5 10*3/uL (ref 1.7–7.7)
Neutrophils Relative %: 66 %
PLATELETS: 303 10*3/uL (ref 150–400)
RBC: 4.15 MIL/uL — ABNORMAL LOW (ref 4.22–5.81)
RDW: 12.4 % (ref 11.5–15.5)
WBC: 11.5 10*3/uL — ABNORMAL HIGH (ref 4.0–10.5)

## 2016-07-06 LAB — BASIC METABOLIC PANEL
Anion gap: 7 (ref 5–15)
BUN: 12 mg/dL (ref 6–20)
CHLORIDE: 106 mmol/L (ref 101–111)
CO2: 27 mmol/L (ref 22–32)
CREATININE: 1.14 mg/dL (ref 0.61–1.24)
Calcium: 8.2 mg/dL — ABNORMAL LOW (ref 8.9–10.3)
GFR calc Af Amer: >60 mL/min (ref >60)
GFR calc non Af Amer: >60 mL/min (ref >60)
Glucose, Bld: 110 mg/dL — ABNORMAL HIGH (ref 65–99)
Potassium: 3.3 mmol/L — ABNORMAL LOW (ref 3.5–5.1)
Sodium: 140 mmol/L (ref 135–145)

## 2016-07-06 LAB — ETHANOL

## 2016-07-06 MED ORDER — POTASSIUM CHLORIDE CRYS ER 20 MEQ PO TBCR
40.0000 meq | EXTENDED_RELEASE_TABLET | Freq: Once | ORAL | Status: AC
Start: 2016-07-06 — End: 2016-07-06
  Administered 2016-07-06: 40 meq via ORAL
  Filled 2016-07-06: qty 2

## 2016-07-06 MED ORDER — SODIUM CHLORIDE 0.9 % IV BOLUS (SEPSIS)
1000.0000 mL | Freq: Once | INTRAVENOUS | Status: AC
  Administered 2016-07-06: 1000 mL via INTRAVENOUS

## 2016-07-06 NOTE — ED Triage Notes (Signed)
Pt c/o generalized body cramps x 1 week.  States that his body will cramp up causing him severe pain.  Pt ambulatory to triage.  Rates pain 22/10.

## 2016-07-06 NOTE — Discharge Instructions (Signed)
Get plenty of rest and drink a lot of fluids.  Try to eat foods which have more potassium in them.

## 2016-07-06 NOTE — ED Provider Notes (Signed)
[facility]-EMER[facility] DEPT Provider Note   CSN: [id number] Arrival date & time: 07/06/16  [id number]     History   Chief Complaint Chief Complaint  Patient presents with  . Body Cramps    HPI Keith Henry is a 41 y.o. male.  Complains of "cramps all over", despite trying to drink a lot of water.  He denies fever, chills, nausea, vomiting, diarrhea, cough, persistent chest pain or dizziness.  He denies use of alcohol or illegal drugs at this time.  There are no other known modifying factors.  HPI  Past Medical History:  Diagnosis Date  . Asthma     There are no active problems to display for this patient.   Past Surgical History:  Procedure Laterality Date  . ANKLE SURGERY    . SKIN GRAFT    . SKULL FRACTURE ELEVATION     pt unsure of specifics       Home Medications    Prior to Admission medications   Not on File    Family History No family history on file.  Social History Social History  Substance Use Topics  . Smoking status: Current Every Day Smoker    Packs/day: 2.00    Types: Cigarettes  . Smokeless tobacco: Never Used  . Alcohol use 1.0 oz/week    2 Standard drinks or equivalent per week     Comment: occasion     Allergies   Other   Review of Systems Review of Systems  All other systems reviewed and are negative.    Physical Exam Updated Vital Signs BP 130/88 (BP Location: Left Arm)   Pulse 79   Temp 98 F (36.7 C) (Oral)   Resp 20   Ht 5' 10.75" (1.797 m)   Wt 93 kg (205 lb)   SpO2 99%   BMI 28.79 kg/m   Physical Exam  Constitutional: He is oriented to person, place, and time. He appears well-developed and well-nourished. No distress.  HENT:  Head: Normocephalic and atraumatic.  Right Ear: External ear normal.  Left Ear: External ear normal.  Eyes: Conjunctivae and EOM are normal. Pupils are equal, round, and reactive to light.  Neck: Normal range of motion and phonation normal. Neck supple.  Cardiovascular: Normal rate, regular  rhythm and normal heart sounds.   Pulmonary/Chest: Effort normal and breath sounds normal. He exhibits no bony tenderness.  Abdominal: Soft. There is no tenderness.  Musculoskeletal: Normal range of motion.  Neurological: He is alert and oriented to person, place, and time. No cranial nerve deficit or sensory deficit. He exhibits normal muscle tone. Coordination normal.  No dysarthria or aphasia.  Skin: Skin is warm, dry and intact.  Psychiatric: He has a normal mood and affect. His behavior is normal. Judgment and thought content normal.  Nursing note and vitals reviewed.    ED Treatments / Results  Labs (all labs ordered are listed, but only abnormal results are displayed) Labs Reviewed  BASIC METABOLIC PANEL - Abnormal; Notable for the following:       Result Value   Potassium 3.3 (*)    Glucose, Bld 110 (*)    Calcium 8.2 (*)    All other components within normal limits  CBC WITH DIFFERENTIAL/PLATELET - Abnormal; Notable for the following:    WBC 11.5 (*)    RBC 4.15 (*)    All other components within normal limits  ETHANOL  RAPID URINE DRUG SCREEN, HOSP PERFORMED  URINALYSIS, ROUTINE W REFLEX MICROSCOPIC    EKG  EKG Interpretation None       Radiology No results found.  Procedures Procedures (including critical care time)  Medications Ordered in ED Medications  potassium chloride SA (K-DUR,KLOR-CON) CR tablet 40 mEq (not administered)  sodium chloride 0.9 % bolus 1,000 mL (1,000 mLs Intravenous New Bag/Given 07/06/16 1610)     Initial Impression / Assessment and Plan / ED Course  I have reviewed the triage vital signs and the nursing notes.  Pertinent labs & imaging results that were available during my care of the patient were reviewed by me and considered in my medical decision making (see chart for details).      Patient Vitals for the past 24 hrs:  BP Temp Temp src Pulse Resp SpO2 Height Weight  07/06/16 0630 - 98 F (36.7 C) Oral - - - - -    07/06/16 0624 130/88 - - 79 20 99 % 5' 10.75" (1.797 m) 93 kg (205 lb)    8:17 AM Reevaluation with update and discussion. After initial assessment and treatment, an updated evaluation reveals, no change in clinical status, findings discussed with the patient and all questions answered. [name]    Final Clinical Impressions(s) / ED Diagnoses   Final diagnoses:  Myalgia  Hypokalemia    Nonspecific myalgias, patient homeless, initially and spending a lot of time outside.  Nursing Notes Reviewed/ Care Coordinated Applicable Imaging Reviewed Interpretation of Laboratory Data incorporated into ED treatment  The patient appears reasonably screened and/or stabilized for discharge and I doubt any other medical condition or other [facility] requiring further screening, evaluation, or treatment in the ED at this time prior to discharge.  Plan: Home Medications-OTC analgesia; Home Treatments-increase potassium in diet, drink plenty of fluids; return here if the recommended treatment, does not improve the symptoms; Recommended follow up-PCP, as needed   New Prescriptions New Prescriptions   No medications on file     Mancel Bale, MD 07/06/16 [phone]

## 2016-07-20 ENCOUNTER — Encounter (HOSPITAL_COMMUNITY): Payer: Self-pay | Admitting: Emergency Medicine

## 2016-07-20 ENCOUNTER — Emergency Department (HOSPITAL_COMMUNITY)
Admission: EM | Admit: 2016-07-20 | Discharge: 2016-07-20 | Disposition: A | Discharge status: Home / Self Care | Payer: No Typology Code available for payment source | Attending: Emergency Medicine | Admitting: Emergency Medicine

## 2016-07-20 ENCOUNTER — Emergency Department (HOSPITAL_COMMUNITY): Payer: No Typology Code available for payment source

## 2016-07-20 DIAGNOSIS — J45901 Unspecified asthma with (acute) exacerbation: Secondary | ICD-10-CM | POA: Insufficient documentation

## 2016-07-20 DIAGNOSIS — F17210 Nicotine dependence, cigarettes, uncomplicated: Secondary | ICD-10-CM | POA: Insufficient documentation

## 2016-07-20 DIAGNOSIS — Z79899 Other long term (current) drug therapy: Secondary | ICD-10-CM | POA: Insufficient documentation

## 2016-07-20 MED ORDER — PREDNISONE 50 MG PO TABS
50.0000 mg | ORAL_TABLET | Freq: Every day | ORAL | 0 refills | Status: DC
Start: 2016-07-20 — End: 2016-07-31

## 2016-07-20 MED ORDER — ALBUTEROL SULFATE HFA 108 (90 BASE) MCG/ACT IN AERS
2.0000 | INHALATION_SPRAY | Freq: Once | RESPIRATORY_TRACT | Status: AC
  Administered 2016-07-20: 2 via RESPIRATORY_TRACT
  Filled 2016-07-20: qty 6.7

## 2016-07-20 MED ORDER — GUAIFENESIN 100 MG/5ML PO LIQD: 100.0000 mg | ORAL | 0 refills | Status: DC | PRN

## 2016-07-20 MED ORDER — PREDNISONE 20 MG PO TABS
60.0000 mg | ORAL_TABLET | Freq: Once | ORAL | Status: AC
  Administered 2016-07-20: 60 mg via ORAL
  Filled 2016-07-20: qty 3

## 2016-07-20 NOTE — Discharge Instructions (Signed)
Medications: Prednisone, Robitussin, albuterol inhaler  Treatment: Take prednisone once daily starting tomorrow for 4 days. Take Robitussin every 4 hours as needed for cough. Use albuterol inhaler hours as needed for chest tightness, shortness of breath, or wheezing.  Follow-up: Please establish care with a primary care provider by calling the numbers outlined below or circled in black in your discharge paperwork. Please return to the emergency department if you develop any new or worsening symptoms.

## 2016-07-20 NOTE — ED Triage Notes (Signed)
Patient c/o productive cough with green phlegm x 2 weeks and lower chest pain with movement x 5 days, patient falling asleep while sitting in triage chair and constantly having to repeat questions.

## 2016-07-20 NOTE — ED Provider Notes (Signed)
[facility]-EMERG[facility] DEPT Provider Note   CSN: [id number] Arrival date & time: 07/20/16  1121  By signing my name below, I, Keith Henry, attest that this documentation has been prepared under the direction and in the presence of [employer], PA-C. Electronically Signed: Linna Henry, Scribe. 07/20/2016. 12:58 PM.  History   Chief Complaint Chief Complaint  Patient presents with  . Cough  . Chest Pain   The history is provided by the patient. No language interpreter was used.    HPI Comments: Keith Henry is a 41 y.o. male with a PMHx of asthma who presents to the Emergency Department complaining of a persistent cough productive of green sputum for a couple of weeks. He reports some associated chest tightness, dyspnea, and wheezing. He states his symptoms are consistent with past asthma exacerbations. Patient has two jobs and is on his feet frequently at work which he believes has contributed to the persistence of his respiratory symptoms. Patient reports he is out of his albuterol inhaler and has not used an inhaler in "a long time". No alleviating factors noted and no medications or treatments tried. He denies chest pain, fevers, chills, hemoptysis, ear pain, sore throat, or any other associated symptoms.  Past Medical History:  Diagnosis Date  . Asthma     There are no active problems to display for this patient.   Past Surgical History:  Procedure Laterality Date  . ANKLE SURGERY    . SKIN GRAFT    . SKULL FRACTURE ELEVATION     pt unsure of specifics       Home Medications    Prior to Admission medications   Medication Sig Start Date End Date Taking? Authorizing Provider  guaiFENesin (ROBITUSSIN) 100 MG/5ML liquid Take 5-10 mLs (100-200 mg total) by mouth every 4 (four) hours as needed for cough. 07/20/16   Gwendolen Hewlett, Waylan Boga, PA-C  predniSONE (DELTASONE) 50 MG tablet Take 1 tablet (50 mg total) by mouth daily. 07/20/16   Emi Holes, PA-C    Family History No  family history on file.  Social History Social History  Substance Use Topics  . Smoking status: Current Every Day Smoker    Packs/day: 2.00    Types: Cigarettes  . Smokeless tobacco: Never Used  . Alcohol use 1.0 oz/week    2 Standard drinks or equivalent per week     Comment: occasion     Allergies   Other   Review of Systems Review of Systems  Constitutional: Negative for chills and fever.  HENT: Negative for ear pain and sore throat.   Respiratory: Positive for cough, chest tightness, shortness of breath and wheezing.   Cardiovascular: Negative for chest pain.   Physical Exam Updated Vital Signs BP 134/86 (BP Location: Left Arm)   Pulse 70   Temp 98.1 F (36.7 C) (Oral)   Resp 18   Ht 5' 10.75" (1.797 m)   Wt 89.8 kg (198 lb)   SpO2 100%   BMI 27.81 kg/m   Physical Exam  Constitutional: He appears well-developed and well-nourished. No distress.  HENT:  Head: Normocephalic and atraumatic.  Right Ear: Tympanic membrane normal.  Left Ear: Tympanic membrane normal.  Mouth/Throat: Oropharynx is clear and moist. No oropharyngeal exudate.  Eyes: Conjunctivae are normal. Pupils are equal, round, and reactive to light. Right eye exhibits no discharge. Left eye exhibits no discharge. No scleral icterus.  Neck: Normal range of motion. Neck supple. No thyromegaly present.  Cardiovascular: Normal rate, regular rhythm, normal  heart sounds and intact distal pulses.  Exam reveals no gallop and no friction rub.   No murmur heard. Pulmonary/Chest: Effort normal. No stridor. No respiratory distress. He has wheezes. He has no rales.  Expiratory wheezes bilaterally.  Abdominal: He exhibits no distension.  Musculoskeletal: He exhibits no edema.  Lymphadenopathy:    He has no cervical adenopathy.  Neurological: He is alert. Coordination normal.  Skin: Skin is warm and dry. No rash noted. He is not diaphoretic. No pallor.  Psychiatric: He has a normal mood and affect.  Nursing  note and vitals reviewed.  ED Treatments / Results  Labs (all labs ordered are listed, but only abnormal results are displayed) Labs Reviewed - No data to display  EKG  EKG Interpretation None       Radiology Dg Chest 2 View  Result Date: 07/20/2016 CLINICAL DATA:  Cough.  Chest pain. EXAM: CHEST  2 VIEW COMPARISON:  02/28/2014 chest radiograph. FINDINGS: Stable cardiomediastinal silhouette with normal heart size. No pneumothorax. No pleural effusion. Lungs appear clear, with no acute consolidative airspace disease and no pulmonary edema. IMPRESSION: No active cardiopulmonary disease. Electronically Signed   By: Delbert PhenixJason A Poff M.D.   On: 07/20/2016 13:05    Procedures Procedures (including critical care time)  DIAGNOSTIC STUDIES: Oxygen Saturation is 96% on RA, adequate by my interpretation.    COORDINATION OF CARE: 12:58 PM Discussed treatment plan with pt at bedside and pt agreed to plan.  Medications Ordered in ED Medications  albuterol (PROVENTIL HFA;VENTOLIN HFA) 108 (90 Base) MCG/ACT inhaler 2 puff (2 puffs Inhalation Given 07/20/16 1301)  predniSONE (DELTASONE) tablet 60 mg (60 mg Oral Given 07/20/16 1338)     Initial Impression / Assessment and Plan / ED Course  I have reviewed the triage vital signs and the nursing notes.  Pertinent labs & imaging results that were available during my care of the patient were reviewed by me and considered in my medical decision making (see chart for details).    Patient with mild signs and symptoms of asthma. Oxygen saturation is above 90%. No accessory muscle use, no cyanosis. TreatedWith albuterol inhaler in the ED.  Patient feels improved after treatment. Will discharge with albuterol inhaler and 5 day prednisone burst. Pt instructed to follow up and establish care with PCP. Discussed return precautions. Patient vitals stable throughout ED course and discharged in satisfactory condition.  Final Clinical Impressions(s) / ED  Diagnoses   Final diagnoses:  Exacerbation of asthma, unspecified asthma severity, unspecified whether persistent    New Prescriptions Discharge Medication List as of 07/20/2016  1:30 PM    START taking these medications   Details  guaiFENesin (ROBITUSSIN) 100 MG/5ML liquid Take 5-10 mLs (100-200 mg total) by mouth every 4 (four) hours as needed for cough., Starting Sun 07/20/2016, Print    predniSONE (DELTASONE) 50 MG tablet Take 1 tablet (50 mg total) by mouth daily., Starting Sun 07/20/2016, Print       I personally performed the services described in this documentation, which was scribed in my presence. The recorded information has been reviewed and is accurate.    Emi HolesLaw, Lakoda Raske M, PA-C 07/20/16 1629    Jacalyn LefevreHaviland, Julie, MD 07/20/16 [phone]

## 2016-07-31 ENCOUNTER — Emergency Department (HOSPITAL_COMMUNITY)
Admission: EM | Admit: 2016-07-31 | Discharge: 2016-07-31 | Disposition: A | Discharge status: Home / Self Care | Payer: No Typology Code available for payment source | Attending: Emergency Medicine | Admitting: Emergency Medicine

## 2016-07-31 ENCOUNTER — Emergency Department (HOSPITAL_COMMUNITY): Payer: No Typology Code available for payment source

## 2016-07-31 ENCOUNTER — Encounter (HOSPITAL_COMMUNITY): Payer: Self-pay | Admitting: Emergency Medicine

## 2016-07-31 DIAGNOSIS — F17210 Nicotine dependence, cigarettes, uncomplicated: Secondary | ICD-10-CM | POA: Insufficient documentation

## 2016-07-31 DIAGNOSIS — R062 Wheezing: Secondary | ICD-10-CM | POA: Insufficient documentation

## 2016-07-31 MED ORDER — ALBUTEROL SULFATE (2.5 MG/3ML) 0.083% IN NEBU
5.0000 mg | INHALATION_SOLUTION | Freq: Once | RESPIRATORY_TRACT | Status: AC
  Administered 2016-07-31: 5 mg via RESPIRATORY_TRACT
  Filled 2016-07-31: qty 6

## 2016-07-31 MED ORDER — ALBUTEROL SULFATE HFA 108 (90 BASE) MCG/ACT IN AERS
2.0000 | INHALATION_SPRAY | RESPIRATORY_TRACT | Status: DC | PRN
  Administered 2016-07-31: 2 via RESPIRATORY_TRACT
  Filled 2016-07-31: qty 6.7

## 2016-07-31 MED ORDER — IPRATROPIUM BROMIDE 0.02 % IN SOLN
0.5000 mg | Freq: Once | RESPIRATORY_TRACT | Status: AC
  Administered 2016-07-31: 0.5 mg via RESPIRATORY_TRACT
  Filled 2016-07-31: qty 2.5

## 2016-07-31 MED ORDER — PREDNISONE 20 MG PO TABS: 40.0000 mg | ORAL_TABLET | Freq: Every day | ORAL | 0 refills | Status: DC

## 2016-07-31 MED ORDER — PREDNISONE 20 MG PO TABS
60.0000 mg | ORAL_TABLET | Freq: Once | ORAL | Status: AC
  Administered 2016-07-31: 60 mg via ORAL
  Filled 2016-07-31: qty 3

## 2016-07-31 NOTE — ED Provider Notes (Signed)
[facility]-EMERGENCY DEPT Provider Note   CSN: [id number] Arrival date & time: 07/31/16  0321     History   Chief Complaint Chief Complaint  Patient presents with  . Chest Pain    HPI Keith Henry is a 41 y.o. male.  Patient with past medical history of asthma presents emergency department with chief complaint of chest tightness. He states that the symptoms began yesterday. He reports some associated wheezing and cough. He denies any productive cough or fever. He has not taken anything for symptoms. There are no modifying factors. There are no other associated symptoms.   The history is provided by the patient. No language interpreter was used.    Past Medical History:  Diagnosis Date  . Asthma     There are no active problems to display for this patient.   Past Surgical History:  Procedure Laterality Date  . ANKLE SURGERY    . SKIN GRAFT    . SKULL FRACTURE ELEVATION     pt unsure of specifics       Home Medications    Prior to Admission medications   Medication Sig Start Date End Date Taking? Authorizing Provider  guaiFENesin (ROBITUSSIN) 100 MG/5ML liquid Take 5-10 mLs (100-200 mg total) by mouth every 4 (four) hours as needed for cough. Patient not taking: Reported on 07/31/2016 07/20/16   Emi HolesLaw, Alexandra M, PA-C  predniSONE (DELTASONE) 50 MG tablet Take 1 tablet (50 mg total) by mouth daily. Patient not taking: Reported on 07/31/2016 07/20/16   Emi HolesLaw, Alexandra M, PA-C    Family History History reviewed. No pertinent family history.  Social History Social History  Substance Use Topics  . Smoking status: Current Every Day Smoker    Packs/day: 2.00    Types: Cigarettes  . Smokeless tobacco: Never Used  . Alcohol use 1.0 oz/week    2 Standard drinks or equivalent per week     Comment: occasion     Allergies   Other   Review of Systems Review of Systems  All other systems reviewed and are negative.    Physical Exam Updated Vital Signs BP 135/87  (BP Location: Left Arm)   Pulse 80   Temp 97.8 F (36.6 C) (Oral)   Resp 18   Physical Exam  Constitutional: He is oriented to person, place, and time. He appears well-developed and well-nourished.  HENT:  Head: Normocephalic and atraumatic.  Eyes: Conjunctivae and EOM are normal. Pupils are equal, round, and reactive to light. Right eye exhibits no discharge. Left eye exhibits no discharge. No scleral icterus.  Neck: Normal range of motion. Neck supple. No JVD present.  Cardiovascular: Normal rate, regular rhythm and normal heart sounds.  Exam reveals no gallop and no friction rub.   No murmur heard. Pulmonary/Chest: Effort normal. No respiratory distress. He has wheezes. He has no rales. He exhibits no tenderness.  Diminished lung sounds with some wheezes bilaterally  Abdominal: Soft. He exhibits no distension and no mass. There is no tenderness. There is no rebound and no guarding.  Musculoskeletal: Normal range of motion. He exhibits no edema or tenderness.  Neurological: He is alert and oriented to person, place, and time.  Skin: Skin is warm and dry.  Psychiatric: He has a normal mood and affect. His behavior is normal. Judgment and thought content normal.  Nursing note and vitals reviewed.    ED Treatments / Results  Labs (all labs ordered are listed, but only abnormal results are displayed) Labs Reviewed - No  data to display  EKG  EKG Interpretation None       Radiology No results found.  Procedures Procedures (including critical care time)  Medications Ordered in ED Medications  albuterol (PROVENTIL) (2.5 MG/3ML) 0.083% nebulizer solution 5 mg (not administered)  ipratropium (ATROVENT) nebulizer solution 0.5 mg (not administered)  predniSONE (DELTASONE) tablet 60 mg (not administered)     Initial Impression / Assessment and Plan / ED Course  I have reviewed the triage vital signs and the nursing notes.  Pertinent labs & imaging results that were  available during my care of the patient were reviewed by me and considered in my medical decision making (see chart for details).     Patient with wheezing bilaterally and chest tightness. Will give breathing treatment. Will reassess.  Patient feels improved after one breathing treatment, he does have more wheezing now. Will give an additional breathing treatment.  5:59 AM Patient feels improved after second breathing treatment. He states that he feels ready to be discharged. His O2 sat is between 97 and 100%. I will give him an inhaler as well as prednisone. His chest x-ray is negative. He is afebrile. Recommend close follow-up with PCP.  Final Clinical Impressions(s) / ED Diagnoses   Final diagnoses:  Wheezing    New Prescriptions New Prescriptions   PREDNISONE (DELTASONE) 20 MG TABLET    Take 2 tablets (40 mg total) by mouth daily.     Roxy Horseman, PA-C 07/31/16 0602    Azalia Bilis, MD 07/31/16 [phone]

## 2016-08-14 ENCOUNTER — Emergency Department (HOSPITAL_COMMUNITY)
Admission: EM | Admit: 2016-08-14 | Discharge: 2016-08-14 | Disposition: A | Discharge status: Home / Self Care | Payer: No Typology Code available for payment source | Attending: Emergency Medicine | Admitting: Emergency Medicine

## 2016-08-14 ENCOUNTER — Emergency Department (HOSPITAL_COMMUNITY): Payer: No Typology Code available for payment source

## 2016-08-14 ENCOUNTER — Encounter (HOSPITAL_COMMUNITY): Payer: Self-pay | Admitting: Emergency Medicine

## 2016-08-14 DIAGNOSIS — J4541 Moderate persistent asthma with (acute) exacerbation: Secondary | ICD-10-CM | POA: Insufficient documentation

## 2016-08-14 DIAGNOSIS — Z79899 Other long term (current) drug therapy: Secondary | ICD-10-CM | POA: Insufficient documentation

## 2016-08-14 DIAGNOSIS — F17210 Nicotine dependence, cigarettes, uncomplicated: Secondary | ICD-10-CM | POA: Insufficient documentation

## 2016-08-14 LAB — CBC WITH DIFFERENTIAL/PLATELET
BASOS ABS: 0.1 10*3/uL (ref 0.0–0.1)
BASOS PCT: 1 %
Eosinophils Absolute: 0.5 10*3/uL (ref 0.0–0.7)
Eosinophils Relative: 4 %
HEMATOCRIT: 41.2 % (ref 39.0–52.0)
Hemoglobin: 13.7 g/dL (ref 13.0–17.0)
LYMPHS PCT: 21 %
Lymphs Abs: 2.6 10*3/uL (ref 0.7–4.0)
MCH: 32.1 pg (ref 26.0–34.0)
MCHC: 33.3 g/dL (ref 30.0–36.0)
MCV: 96.5 fL (ref 78.0–100.0)
MONO ABS: 0.8 10*3/uL (ref 0.1–1.0)
Monocytes Relative: 7 %
NEUTROS ABS: 8.4 10*3/uL — AB (ref 1.7–7.7)
NEUTROS PCT: 67 %
PLATELETS: 312 10*3/uL (ref 150–400)
RBC: 4.27 MIL/uL (ref 4.22–5.81)
RDW: 12.6 % (ref 11.5–15.5)
WBC: 12.5 10*3/uL — AB (ref 4.0–10.5)

## 2016-08-14 LAB — BASIC METABOLIC PANEL
ANION GAP: 7 (ref 5–15)
BUN: 12 mg/dL (ref 6–20)
CALCIUM: 8.6 mg/dL — AB (ref 8.9–10.3)
CO2: 25 mmol/L (ref 22–32)
Chloride: 108 mmol/L (ref 101–111)
Creatinine, Ser: 1.34 mg/dL — ABNORMAL HIGH (ref 0.61–1.24)
GLUCOSE: 122 mg/dL — AB (ref 65–99)
POTASSIUM: 3.8 mmol/L (ref 3.5–5.1)
Sodium: 140 mmol/L (ref 135–145)

## 2016-08-14 LAB — I-STAT TROPONIN, ED: Troponin i, poc: 0 ng/mL (ref 0.00–0.08)

## 2016-08-14 MED ORDER — ALBUTEROL SULFATE HFA 108 (90 BASE) MCG/ACT IN AERS
2.0000 | INHALATION_SPRAY | Freq: Once | RESPIRATORY_TRACT | Status: AC
  Administered 2016-08-14: 2 via RESPIRATORY_TRACT
  Filled 2016-08-14: qty 6.7

## 2016-08-14 MED ORDER — PREDNISONE 20 MG PO TABS: ORAL_TABLET | ORAL | 0 refills | Status: DC

## 2016-08-14 MED ORDER — SODIUM CHLORIDE 0.9 % IV BOLUS (SEPSIS)
1000.0000 mL | Freq: Once | INTRAVENOUS | Status: AC
  Administered 2016-08-14: 1000 mL via INTRAVENOUS

## 2016-08-14 MED ORDER — IPRATROPIUM BROMIDE 0.02 % IN SOLN
0.5000 mg | Freq: Once | RESPIRATORY_TRACT | Status: AC
  Administered 2016-08-14: 0.5 mg via RESPIRATORY_TRACT
  Filled 2016-08-14: qty 2.5

## 2016-08-14 MED ORDER — ALBUTEROL (5 MG/ML) CONTINUOUS INHALATION SOLN
15.0000 mg/h | INHALATION_SOLUTION | Freq: Once | RESPIRATORY_TRACT | Status: AC
  Administered 2016-08-14: 15 mg/h via RESPIRATORY_TRACT
  Filled 2016-08-14: qty 20

## 2016-08-14 MED ORDER — METHYLPREDNISOLONE SODIUM SUCC 125 MG IJ SOLR
125.0000 mg | Freq: Once | INTRAMUSCULAR | Status: AC
  Administered 2016-08-14: 125 mg via INTRAVENOUS
  Filled 2016-08-14: qty 2

## 2016-08-14 MED ORDER — ALBUTEROL SULFATE HFA 108 (90 BASE) MCG/ACT IN AERS: 1.0000 | INHALATION_SPRAY | Freq: Four times a day (QID) | RESPIRATORY_TRACT | 0 refills | Status: DC | PRN

## 2016-08-14 NOTE — ED Provider Notes (Signed)
[facility]-EMERGENCY DEPT Provider Note   CSN: [id number] Arrival date & time: 08/14/16  0247  By signing my name below, I, Freida Busman, attest that this documentation has been prepared under the direction and in the presence of Pricilla Loveless, MD . Electronically Signed: Freida Busman, Scribe. 08/14/2016. 3:02 AM.  History   Chief Complaint Chief Complaint  Patient presents with  . Asthma     The history is provided by the patient. No language interpreter was used.    HPI Comments:  Keith Henry is a 41 y.o. male with a history of asthma who presents to the Emergency Department complaining of SOB that began this morning while cleaning a grill. He states he's used that cleaning solution in the past without issue. He reports associated dry cough and rhinorrhea x 1 week. Pt states his last asthma exacerbation was when he was 41 years old. Pt notes symptoms today are similar but worse. No nausea, vomiting, leg swelling, or fever. Pt denies smoking tobacco but admits to smoking marijuana occasionally. No alleviating factors noted.   Per chart review pt has been seen in the ED multiple times over the last 3 years for asthma exacerbations.   Past Medical History:  Diagnosis Date  . Asthma     There are no active problems to display for this patient.   Past Surgical History:  Procedure Laterality Date  . ANKLE SURGERY    . SKIN GRAFT    . SKULL FRACTURE ELEVATION     pt unsure of specifics       Home Medications    Prior to Admission medications   Medication Sig Start Date End Date Taking? Authorizing Provider  albuterol (PROVENTIL HFA;VENTOLIN HFA) 108 (90 Base) MCG/ACT inhaler Inhale 1-2 puffs into the lungs every 6 (six) hours as needed for wheezing or shortness of breath. 08/14/16   Pricilla Loveless, MD  guaiFENesin (ROBITUSSIN) 100 MG/5ML liquid Take 5-10 mLs (100-200 mg total) by mouth every 4 (four) hours as needed for cough. Patient not taking: Reported on 07/31/2016  07/20/16   Emi Holes, PA-C  predniSONE (DELTASONE) 20 MG tablet 2 tabs po daily x 4 days 08/14/16   Pricilla Loveless, MD    Family History No family history on file.  Social History Social History  Substance Use Topics  . Smoking status: Current Every Day Smoker    Packs/day: 2.00    Types: Cigarettes  . Smokeless tobacco: Never Used  . Alcohol use 1.0 oz/week    2 Standard drinks or equivalent per week     Comment: occasion     Allergies   Other   Review of Systems Review of Systems  Constitutional: Negative for fever.  HENT: Positive for rhinorrhea.   Respiratory: Positive for cough and shortness of breath.   Cardiovascular: Negative for leg swelling.  All other systems reviewed and are negative.    Physical Exam Updated Vital Signs BP 134/85   Pulse 90   Temp 98.3 F (36.8 C) (Oral)   Resp (!) 21   Ht 5\' 10"  (1.778 m)   Wt 95.3 kg (210 lb)   SpO2 94%   BMI 30.13 kg/m   Physical Exam  Constitutional: He is oriented to person, place, and time. He appears well-developed and well-nourished.  HENT:  Head: Normocephalic and atraumatic.  Right Ear: External ear normal.  Left Ear: External ear normal.  Nose: Nose normal.  Eyes: Right eye exhibits no discharge. Left eye exhibits no discharge.  Neck:  Neck supple.  Cardiovascular: Normal rate, regular rhythm and normal heart sounds.   Pulmonary/Chest: Effort normal. He has wheezes (diffuse expiratory wheezes). He exhibits tenderness (bilateral lower chest wall tenderness).  Abdominal: Soft. There is no tenderness.  Musculoskeletal: He exhibits no edema.  Neurological: He is alert and oriented to person, place, and time.  Skin: Skin is warm and dry.  Nursing note and vitals reviewed.    ED Treatments / Results  DIAGNOSTIC STUDIES:  Oxygen Saturation is 99% on RA, normal by my interpretation.    COORDINATION OF CARE:  2:56 AM Discussed treatment plan with pt at bedside and pt agreed to  plan.  Labs (all labs ordered are listed, but only abnormal results are displayed) Labs Reviewed  CBC WITH DIFFERENTIAL/PLATELET - Abnormal; Notable for the following:       Result Value   WBC 12.5 (*)    Neutro Abs 8.4 (*)    All other components within normal limits  BASIC METABOLIC PANEL - Abnormal; Notable for the following:    Glucose, Bld 122 (*)    Creatinine, Ser 1.34 (*)    Calcium 8.6 (*)    All other components within normal limits  I-STAT TROPOININ, ED    EKG  EKG Interpretation  Date/Time:  Thursday August 14 2016 02:47:23 EDT Ventricular Rate:  79 PR Interval:    QRS Duration: 86 QT Interval:  368 QTC Calculation: 422 R Axis:   54 Text Interpretation:  Sinus rhythm RSR' in V1 or V2, right VCD or RVH no significant change since June 2018 Confirmed by Pricilla Loveless [phone]) on 08/14/2016 2:58:12 AM       Radiology Dg Chest Portable 1 View  Result Date: 08/14/2016 CLINICAL DATA:  41 year old male with shortness of breath. History of asthma. EXAM: PORTABLE CHEST 1 VIEW COMPARISON:  Chest radiograph dated 07/31/2016 FINDINGS: The heart size and mediastinal contours are within normal limits. Both lungs are clear. The visualized skeletal structures are unremarkable. IMPRESSION: No active disease. Electronically Signed   By: Elgie Collard M.D.   On: 08/14/2016 03:27    Procedures Procedures (including critical care time)  Medications Ordered in ED Medications  methylPREDNISolone sodium succinate (SOLU-MEDROL) 125 mg/2 mL injection 125 mg (125 mg Intravenous Given 08/14/16 0308)  albuterol (PROVENTIL,VENTOLIN) solution continuous neb (15 mg/hr Nebulization Given 08/14/16 0323)  ipratropium (ATROVENT) nebulizer solution 0.5 mg (0.5 mg Nebulization Given 08/14/16 0308)  sodium chloride 0.9 % bolus 1,000 mL (0 mLs Intravenous Stopped 08/14/16 0449)  albuterol (PROVENTIL HFA;VENTOLIN HFA) 108 (90 Base) MCG/ACT inhaler 2 puff (2 puffs Inhalation Given 08/14/16 0551)      Initial Impression / Assessment and Plan / ED Course  I have reviewed the triage vital signs and the nursing notes.  Pertinent labs & imaging results that were available during my care of the patient were reviewed by me and considered in my medical decision making (see chart for details).     Patient does not seem to understand that he has asthma. However he has now significantly improved, and has no increased WOB or wheezing. VS stable. Well appearing. Chest wall pain likely from coughing. doubt ACS, PE, dissection. Steroids, albuterol inhaler, f/u with PCP. Discussed return precautions.   Final Clinical Impressions(s) / ED Diagnoses   Final diagnoses:  Moderate persistent asthma with exacerbation    New Prescriptions Discharge Medication List as of 08/14/2016  5:44 AM    START taking these medications   Details  albuterol (PROVENTIL HFA;VENTOLIN HFA) 108 (90  Base) MCG/ACT inhaler Inhale 1-2 puffs into the lungs every 6 (six) hours as needed for wheezing or shortness of breath., Starting Thu 08/14/2016, Print       I personally performed the services described in this documentation, which was scribed in my presence. The recorded information has been reviewed and is accurate.    Pricilla LovelessGoldston, Brazen Domangue, MD 08/14/16 [phone]

## 2016-08-14 NOTE — ED Triage Notes (Addendum)
Patient arrived with EMS reports asthma attack this morning with wheezing and dry cough , pt. Has no inhaler at home , denies fever or chills . Pt. stated he inhaled cleaning solution at work this morning . Pt. received Albuterol 5 mg nebulizer prior to arrival .

## 2016-09-14 ENCOUNTER — Emergency Department (HOSPITAL_COMMUNITY): Payer: Self-pay

## 2016-09-14 ENCOUNTER — Emergency Department (HOSPITAL_COMMUNITY)
Admission: EM | Admit: 2016-09-14 | Discharge: 2016-09-14 | Disposition: A | Discharge status: Home / Self Care | Payer: Self-pay | Attending: Emergency Medicine | Admitting: Emergency Medicine

## 2016-09-14 ENCOUNTER — Encounter (HOSPITAL_COMMUNITY): Payer: Self-pay

## 2016-09-14 DIAGNOSIS — S42001A Fracture of unspecified part of right clavicle, initial encounter for closed fracture: Secondary | ICD-10-CM | POA: Insufficient documentation

## 2016-09-14 DIAGNOSIS — Y939 Activity, unspecified: Secondary | ICD-10-CM | POA: Insufficient documentation

## 2016-09-14 DIAGNOSIS — Z79899 Other long term (current) drug therapy: Secondary | ICD-10-CM | POA: Insufficient documentation

## 2016-09-14 DIAGNOSIS — Y929 Unspecified place or not applicable: Secondary | ICD-10-CM | POA: Insufficient documentation

## 2016-09-14 DIAGNOSIS — R0789 Other chest pain: Secondary | ICD-10-CM | POA: Insufficient documentation

## 2016-09-14 DIAGNOSIS — Y999 Unspecified external cause status: Secondary | ICD-10-CM | POA: Insufficient documentation

## 2016-09-14 DIAGNOSIS — J45909 Unspecified asthma, uncomplicated: Secondary | ICD-10-CM | POA: Insufficient documentation

## 2016-09-14 DIAGNOSIS — R4182 Altered mental status, unspecified: Secondary | ICD-10-CM | POA: Insufficient documentation

## 2016-09-14 DIAGNOSIS — F17210 Nicotine dependence, cigarettes, uncomplicated: Secondary | ICD-10-CM | POA: Insufficient documentation

## 2016-09-14 LAB — PROTIME-INR
INR: 1.01
Prothrombin Time: 13.3 seconds (ref 11.4–15.2)

## 2016-09-14 LAB — CBC
HCT: 39.5 % (ref 39.0–52.0)
HEMOGLOBIN: 13.6 g/dL (ref 13.0–17.0)
MCH: 31.9 pg (ref 26.0–34.0)
MCHC: 34.4 g/dL (ref 30.0–36.0)
MCV: 92.5 fL (ref 78.0–100.0)
PLATELETS: 301 10*3/uL (ref 150–400)
RBC: 4.27 MIL/uL (ref 4.22–5.81)
RDW: 12.1 % (ref 11.5–15.5)
WBC: 12.9 10*3/uL — AB (ref 4.0–10.5)

## 2016-09-14 LAB — URINALYSIS, ROUTINE W REFLEX MICROSCOPIC
Bilirubin Urine: NEGATIVE
GLUCOSE, UA: NEGATIVE mg/dL
HGB URINE DIPSTICK: NEGATIVE
KETONES UR: NEGATIVE mg/dL
LEUKOCYTES UA: NEGATIVE
Nitrite: NEGATIVE
PROTEIN: NEGATIVE mg/dL
Specific Gravity, Urine: 1.032 — ABNORMAL HIGH (ref 1.005–1.030)
pH: 6 (ref 5.0–8.0)

## 2016-09-14 LAB — COMPREHENSIVE METABOLIC PANEL
ALK PHOS: 69 U/L (ref 38–126)
ALT: 11 U/L — AB (ref 17–63)
ANION GAP: 8 (ref 5–15)
AST: 25 U/L (ref 15–41)
Albumin: 3.1 g/dL — ABNORMAL LOW (ref 3.5–5.0)
BILIRUBIN TOTAL: 0.7 mg/dL (ref 0.3–1.2)
BUN: 9 mg/dL (ref 6–20)
CALCIUM: 8.1 mg/dL — AB (ref 8.9–10.3)
CO2: 24 mmol/L (ref 22–32)
CREATININE: 1.06 mg/dL (ref 0.61–1.24)
Chloride: 108 mmol/L (ref 101–111)
Glucose, Bld: 94 mg/dL (ref 65–99)
Potassium: 3.9 mmol/L (ref 3.5–5.1)
SODIUM: 140 mmol/L (ref 135–145)
TOTAL PROTEIN: 5.6 g/dL — AB (ref 6.5–8.1)

## 2016-09-14 LAB — SAMPLE TO BLOOD BANK

## 2016-09-14 LAB — RAPID URINE DRUG SCREEN, HOSP PERFORMED
Amphetamines: NOT DETECTED
BARBITURATES: NOT DETECTED
Benzodiazepines: NOT DETECTED
Cocaine: NOT DETECTED
OPIATES: NOT DETECTED
TETRAHYDROCANNABINOL: POSITIVE — AB

## 2016-09-14 LAB — I-STAT CG4 LACTIC ACID, ED: LACTIC ACID, VENOUS: 1.71 mmol/L (ref 0.5–1.9)

## 2016-09-14 LAB — ETHANOL: ALCOHOL ETHYL (B): 86 mg/dL — AB (ref <5)

## 2016-09-14 MED ORDER — IOPAMIDOL (ISOVUE-300) INJECTION 61%
INTRAVENOUS | Status: AC
  Administered 2016-09-14: 19:00:00
  Filled 2016-09-14: qty 100

## 2016-09-14 MED ORDER — LORAZEPAM 2 MG/ML IJ SOLN
0.5000 mg | Freq: Once | INTRAMUSCULAR | Status: AC
  Administered 2016-09-14: 0.5 mg via INTRAVENOUS
  Filled 2016-09-14: qty 1

## 2016-09-14 MED ORDER — METHOCARBAMOL 500 MG PO TABS
1000.0000 mg | ORAL_TABLET | Freq: Once | ORAL | Status: AC
  Administered 2016-09-14: 1000 mg via ORAL
  Filled 2016-09-14: qty 2

## 2016-09-14 MED ORDER — METHOCARBAMOL 500 MG PO TABS: 1000.0000 mg | ORAL_TABLET | Freq: Three times a day (TID) | ORAL | 0 refills | Status: DC | PRN

## 2016-09-14 MED ORDER — SODIUM CHLORIDE 0.9 % IV BOLUS (SEPSIS)
1000.0000 mL | Freq: Once | INTRAVENOUS | Status: AC
  Administered 2016-09-14: 1000 mL via INTRAVENOUS

## 2016-09-14 MED ORDER — HYDROCODONE-ACETAMINOPHEN 5-325 MG PO TABS
1.0000 | ORAL_TABLET | Freq: Once | ORAL | Status: AC
  Administered 2016-09-14: 1 via ORAL
  Filled 2016-09-14: qty 1

## 2016-09-14 MED ORDER — HYDROCODONE-ACETAMINOPHEN 5-325 MG PO TABS: 1.0000 | ORAL_TABLET | ORAL | 0 refills | Status: DC | PRN
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
## 2018-11-24 DIAGNOSIS — S39012A Strain of muscle, fascia and tendon of lower back, initial encounter: Secondary | ICD-10-CM | POA: Insufficient documentation

## 2018-11-26 ENCOUNTER — Ambulatory Visit: Payer: Self-pay | Admitting: Family Medicine

## 2019-08-03 ENCOUNTER — Emergency Department (HOSPITAL_COMMUNITY): Payer: Self-pay

## 2019-08-03 ENCOUNTER — Emergency Department (HOSPITAL_COMMUNITY)
Admission: EM | Admit: 2019-08-03 | Discharge: 2019-08-03 | Disposition: A | Discharge status: Home / Self Care | Payer: Self-pay | Attending: Emergency Medicine | Admitting: Emergency Medicine

## 2019-08-03 ENCOUNTER — Encounter (HOSPITAL_COMMUNITY): Payer: Self-pay | Admitting: Emergency Medicine

## 2019-08-03 DIAGNOSIS — F17210 Nicotine dependence, cigarettes, uncomplicated: Secondary | ICD-10-CM | POA: Insufficient documentation

## 2019-08-03 DIAGNOSIS — J45901 Unspecified asthma with (acute) exacerbation: Secondary | ICD-10-CM | POA: Insufficient documentation

## 2019-08-03 MED ORDER — IPRATROPIUM-ALBUTEROL 0.5-2.5 (3) MG/3ML IN SOLN
3.0000 mL | Freq: Once | RESPIRATORY_TRACT | Status: AC
  Administered 2019-08-03: 3 mL via RESPIRATORY_TRACT
  Filled 2019-08-03: qty 3

## 2019-08-03 MED ORDER — SODIUM CHLORIDE 0.9 % IV BOLUS: 1000.0000 mL | Freq: Once | INTRAVENOUS | Status: DC

## 2019-08-03 MED ORDER — PREDNISONE 20 MG PO TABS
60.0000 mg | ORAL_TABLET | Freq: Once | ORAL | Status: AC
  Administered 2019-08-03: 60 mg via ORAL
  Filled 2019-08-03: qty 3

## 2019-08-03 MED ORDER — ALBUTEROL (5 MG/ML) CONTINUOUS INHALATION SOLN: 10.0000 mg/h | INHALATION_SOLUTION | Freq: Once | RESPIRATORY_TRACT | Status: DC

## 2019-08-03 MED ORDER — ALBUTEROL SULFATE HFA 108 (90 BASE) MCG/ACT IN AERS
2.0000 | INHALATION_SPRAY | Freq: Once | RESPIRATORY_TRACT | Status: AC
  Administered 2019-08-03: 2 via RESPIRATORY_TRACT
  Filled 2019-08-03: qty 6.7

## 2019-08-03 MED ORDER — PREDNISONE 10 MG (21) PO TBPK: ORAL_TABLET | Freq: Every day | ORAL | 0 refills | Status: DC

## 2019-08-03 NOTE — ED Notes (Signed)
Patient verbalizes understanding of discharge instructions . Opportunity for questions and answers were provided . Armband removed by staff ,Pt discharged from ED. W/C  offered at D/C  and Declined W/C at D/C and was escorted to lobby by RN.  

## 2019-08-03 NOTE — ED Notes (Signed)
Pt walked up to this NT and was rude stating "have you just forgotten about me? I am over here and I cannot breathe. I have dealt with asthma all my life and I am just sitting here" This NT explained to the pt that he is waiting to see a doctor and that he has been evaluated by a triage nurse. Pt continued to be rude and talk over this NT while trying to explain why he is waiting. Pt states "I should have just went home but if I would have then I would have passed out" and returned to his seat.

## 2019-08-03 NOTE — ED Provider Notes (Signed)
[facility] EMERGENCY DEPARTMENT Provider Note   CSN: [id number] Arrival date & time: 08/03/19  0139     History Chief Complaint  Patient presents with  . Shortness of Breath    Ke[facility] Henry is a 44 y.o. male with a history tobacco abuse & asthma who presents to the ED with complaints of asthma exacerbation x 2-3 weeks, worse over the past few days. Patient states that his allergies have been flaring up with nasal congestion & dry cough which has lead to a flare of his asthma. He is having dyspnea & wheezing, using inhaler without much relief, nebulizer txs help some. No other alleviating/aggravating factors. Denies fever, chills, ear pain, sore throat, chest pain, abdominal pain, or leg pain/swelling. He had to be hospitalize for his asthma as a child not as an adult. Has not required intubation previously. No recent sick contacts w/ anyone with covid 19.   HPI     Past Medical History:  Diagnosis Date  . Asthma     There are no problems to display for this patient.   Past Surgical History:  Procedure Laterality Date  . ANKLE SURGERY    . SKIN GRAFT    . SKULL FRACTURE ELEVATION     pt unsure of specifics       No family history on file.  Social History   Tobacco Use  . Smoking status: Current Every Day Smoker    Packs/day: 2.00    Types: Cigarettes  . Smokeless tobacco: Never Used  Vaping Use  . Vaping Use: Never used  Substance Use Topics  . Alcohol use: Yes    Alcohol/week: 2.0 standard drinks    Types: 2 Standard drinks or equivalent per week    Comment: occasion  . Drug use: Yes    Types: Marijuana    Home Medications Prior to Admission medications   Medication Sig Start Date End Date Taking? Authorizing Provider  albuterol (PROVENTIL HFA;VENTOLIN HFA) 108 (90 Base) MCG/ACT inhaler Inhale 1-2 puffs into the lungs every 6 (six) hours as needed for wheezing or shortness of breath. 08/14/16   Pricilla Loveless, MD  guaiFENesin  (ROBITUSSIN) 100 MG/5ML liquid Take 5-10 mLs (100-200 mg total) by mouth every 4 (four) hours as needed for cough. Patient not taking: Reported on 07/31/2016 07/20/16   Emi Holes, PA-C  HYDROcodone-acetaminophen (NORCO) 5-325 MG tablet Take 1 tablet by mouth every 4 (four) hours as needed for severe pain. 09/14/16   Loren Racer, MD  methocarbamol (ROBAXIN) 500 MG tablet Take 2 tablets (1,000 mg total) by mouth every 8 (eight) hours as needed for muscle spasms. 09/14/16   Loren Racer, MD  predniSONE (DELTASONE) 20 MG tablet 2 tabs po daily x 4 days 08/14/16   Pricilla Loveless, MD    Allergies    Other  Review of Systems   Review of Systems  Constitutional: Negative for chills and fever.  HENT: Positive for congestion. Negative for ear pain and sore throat.   Respiratory: Positive for cough, shortness of breath and wheezing.   Cardiovascular: Negative for chest pain and leg swelling.  Gastrointestinal: Negative for abdominal pain and vomiting.  Neurological: Negative for syncope.  All other systems reviewed and are negative.   Physical Exam Updated Vital Signs BP 131/84 (BP Location: Right Arm)   Pulse 62   Temp 98.4 F (36.9 C) (Oral)   Resp 15   Ht 5\' 9"  (1.753 m)   Wt 99.8 kg   SpO2  99%   BMI 32.49 kg/m   Physical Exam Vitals and nursing note reviewed.  Constitutional:      General: He is not in acute distress.    Appearance: He is well-developed. He is not toxic-appearing.  HENT:     Head: Normocephalic and atraumatic.     Right Ear: Ear canal normal. Tympanic membrane is not perforated, erythematous, retracted or bulging.     Left Ear: Ear canal normal. Tympanic membrane is not perforated, erythematous, retracted or bulging.     Ears:     Comments: No mastoid erythema/swellng/tenderness.     Nose:     Right Sinus: No maxillary sinus tenderness or frontal sinus tenderness.     Left Sinus: No maxillary sinus tenderness or frontal sinus tenderness.      Mouth/Throat:     Pharynx: Oropharynx is clear. Uvula midline. No oropharyngeal exudate or posterior oropharyngeal erythema.     Comments: Posterior oropharynx is symmetric appearing. Patient tolerating own secretions without difficulty. No trismus. No drooling. No hot potato voice. No swelling beneath the tongue, submandibular compartment is soft.  Eyes:     General:        Right eye: No discharge.        Left eye: No discharge.     Conjunctiva/sclera: Conjunctivae normal.  Cardiovascular:     Rate and Rhythm: Normal rate and regular rhythm.  Pulmonary:     Effort: Pulmonary effort is normal. No respiratory distress.     Breath sounds: Wheezing (Biphasic) present. No rhonchi or rales.     Comments: Speaking in complete sentences. SpO2 99% on RA.  Abdominal:     General: There is no distension.     Palpations: Abdomen is soft.     Tenderness: There is no abdominal tenderness.  Musculoskeletal:     Cervical back: Neck supple. No rigidity.     Right lower leg: No tenderness. No edema.     Left lower leg: No tenderness. No edema.  Lymphadenopathy:     Cervical: No cervical adenopathy.  Skin:    General: Skin is warm and dry.     Findings: No rash.  Neurological:     Mental Status: He is alert.  Psychiatric:        Behavior: Behavior normal.     ED Results / Procedures / Treatments   Labs (all labs ordered are listed, but only abnormal results are displayed) Labs Reviewed - No data to display  EKG None  Radiology DG Chest 2 View  Result Date: 08/03/2019 CLINICAL DATA:  Short of breath, asthma exacerbation EXAM: CHEST - 2 VIEW COMPARISON:  03/28/2019 FINDINGS: Frontal and lateral views of the chest demonstrate an unremarkable cardiac silhouette. Increased interstitial prominence consistent with reactive airway disease. No airspace disease, effusion, or pneumothorax. No acute bony abnormalities. IMPRESSION: 1. Interstitial prominence consistent with reactive airway disease. No  superimposed pneumonia. Electronically Signed   By: Sharlet Salina M.D.   On: 08/03/2019 02:26    Procedures Procedures (including critical care time)  Medications Ordered in ED Medications  predniSONE (DELTASONE) tablet 60 mg (has no administration in time range)  ipratropium-albuterol (DUONEB) 0.5-2.5 (3) MG/3ML nebulizer solution 3 mL (has no administration in time range)    ED Course  I have reviewed the triage vital signs and the nursing notes.  Pertinent labs & imaging results that were available during my care of the patient were reviewed by me and considered in my medical decision making (see chart for details).  [facility] was evaluated in Emergency Department on 08/03/2019 for the symptoms described in the history of present illness. He/she was evaluated in the context of the global COVID-19 pandemic, which necessitated consideration that the patient might be at risk for infection with the SARS-CoV-2 virus that causes COVID-19. Institutional protocols and algorithms that pertain to the evaluation of patients at risk for COVID-19 are in a state of rapid change based on information released by regulatory bodies including the CDC and federal and state organizations. These policies and algorithms were followed during the patient's care in the ED.    MDM Rules/Calculators/A&P                         Patient presents to the ED with complaints of asthma flare. Patient is nontoxic, resting comfortably, vitals on my assessment WNL. Biphasic wheezing noted without signs of respiratory distress.  Additional history obtained:  Additional history obtained from nursing notes & chart review.   EKG: No STEMI Imaging Studies ordered:  I reviewed CXR ordered per triage protocol- I independently visualized and interpreted imaging which showed  1. Interstitial prominence consistent with reactive airway disease. No superimposed pneumonia.  ED Course:  On initial assessment patient has biphasic  wheezing throughout without signs of respiratory distress. Will give oral steroids and a DuoNeb treatment in the ED with plan for reassessment.  Following duoneb x 3 patient feeling much better, he remains with end expiratory wheeze but he is moving air much better and would like to go home which seems appropriate at this time. Patient is low risk Wells, PERC negative, doubt pulmonary embolism. EKG without STEMI, no complaints of chest pain, feel ACS is unlikely. Chest x-ray without infiltrate to suggest pneumonia. No fever, not around COVID-19 positive individuals as he is aware of, low suspicion for this as well. Likely allergies leading to asthma exacerbation. He is feeling improved status post neb treatments in the ER. Ambulatory SPO2 maintaining greater than 95%. Patient overall appears appropriate for discharge home. I discussed results, treatment plan, need for follow-up, and return precautions with the patient. Provided opportunity for questions, patient confirmed understanding and is in agreement with plan.   Portions of this note were generated with Scientist, clinical (histocompatibility and immunogenetics). Dictation errors may occur despite best attempts at proofreading.  Final Clinical Impression(s) / ED Diagnoses Final diagnoses:  Exacerbation of asthma, unspecified asthma severity, unspecified whether persistent    Rx / DC Orders ED Discharge Orders         Ordered    predniSONE (STERAPRED UNI-PAK 21 TAB) 10 MG (21) TBPK tablet  Daily     Discontinue  Reprint     08/03/19 [address] Pleas Koch, PA-C 08/03/19 1353    Benjiman Core, MD 08/03/19 1513

## 2019-08-03 NOTE — Discharge Instructions (Signed)
You were seen in the emergency department today for an asthma exacerbation.  Your chest x-ray did not show signs of pneumonia.  You were given nebulizer treatment in the emergency department as well as steroids.  Please continue to use your nebulizer or albuterol inhaler every 4-6 hours continuously for the next 48 hours& as needed, after 48 hours use as needed.  Please take the prednisone as prescribed, start this tomorrow as you were given your first dose in the ER.  We have prescribed you new medication(s) today. Discuss the medications prescribed today with your pharmacist as they can have adverse effects and interactions with your other medicines including over the counter and prescribed medications. Seek medical evaluation if you start to experience new or abnormal symptoms after taking one of these medicines, seek care immediately if you start to experience difficulty breathing, feeling of your throat closing, facial swelling, or rash as these could be indications of a more serious allergic reaction  Please follow-up with your primary care provider within 3 days for reevaluation.  Return to the ER for new or worsening symptoms including but not limited to increased work of breathing, fever, coughing up blood, passing out, chest pain, or any other concerns.  Please try to stop smoking as this contributes to the steps of symptoms.

## 2019-08-03 NOTE — ED Triage Notes (Signed)
Patient reports SOB X several weeks that worsened tonight while at work. States history of allergies and asthma. Has attempted OTC measures and rescue inhaler with no relief. Mild wheezing noted. Sats 97% RA

## 2019-09-25 ENCOUNTER — Other Ambulatory Visit: Payer: Self-pay

## 2019-09-25 ENCOUNTER — Encounter (HOSPITAL_COMMUNITY): Payer: Self-pay

## 2019-09-25 DIAGNOSIS — Z79899 Other long term (current) drug therapy: Secondary | ICD-10-CM | POA: Insufficient documentation

## 2019-09-25 DIAGNOSIS — R599 Enlarged lymph nodes, unspecified: Secondary | ICD-10-CM | POA: Insufficient documentation

## 2019-09-25 DIAGNOSIS — F17210 Nicotine dependence, cigarettes, uncomplicated: Secondary | ICD-10-CM | POA: Insufficient documentation

## 2019-09-25 DIAGNOSIS — F1590 Other stimulant use, unspecified, uncomplicated: Secondary | ICD-10-CM | POA: Insufficient documentation

## 2019-09-25 DIAGNOSIS — U071 COVID-19: Secondary | ICD-10-CM | POA: Insufficient documentation

## 2019-09-25 DIAGNOSIS — M79622 Pain in left upper arm: Secondary | ICD-10-CM | POA: Insufficient documentation

## 2019-09-25 DIAGNOSIS — J45909 Unspecified asthma, uncomplicated: Secondary | ICD-10-CM | POA: Insufficient documentation

## 2019-09-25 NOTE — ED Triage Notes (Signed)
Patient arrived stating he has a boil under his left arm that started today. Also states he came for a Covid-19 test due to his girlfriend being positive, but declines any symptoms.

## 2019-09-26 ENCOUNTER — Emergency Department (HOSPITAL_COMMUNITY)
Admission: EM | Admit: 2019-09-26 | Discharge: 2019-09-26 | Disposition: A | Discharge status: Home / Self Care | Payer: Self-pay | Attending: Emergency Medicine | Admitting: Emergency Medicine

## 2019-09-26 DIAGNOSIS — U071 COVID-19: Secondary | ICD-10-CM

## 2019-09-26 DIAGNOSIS — R591 Generalized enlarged lymph nodes: Secondary | ICD-10-CM

## 2019-09-26 LAB — SARS CORONAVIRUS 2 BY RT PCR (HOSPITAL ORDER, PERFORMED IN ~~LOC~~ HOSPITAL LAB): SARS Coronavirus 2: POSITIVE — AB

## 2019-09-26 MED ORDER — ALBUTEROL SULFATE HFA 108 (90 BASE) MCG/ACT IN AERS
2.0000 | INHALATION_SPRAY | Freq: Once | RESPIRATORY_TRACT | Status: AC | PRN
  Administered 2019-09-26: 2 via RESPIRATORY_TRACT
  Filled 2019-09-26: qty 6.7

## 2019-09-26 MED ORDER — NAPROXEN 500 MG PO TABS
500.0000 mg | ORAL_TABLET | Freq: Two times a day (BID) | ORAL | 0 refills | Status: DC
Start: 2019-09-26 — End: 2020-03-22

## 2019-09-26 NOTE — ED Provider Notes (Addendum)
[facility]-EMERGENCY DEPT Provider Note   CSN: [id number] Arrival date & time: 09/25/19  1927     History Chief Complaint  Patient presents with  . Recurrent Skin Infections    [facility] is a 44 y.o. male presenting for evaluation of discomfort of the L axilla.   Pt states he is having discomfort under his L axilla. No fevers or chills. Pt states his girlfriend is covid positive. He has no thad the vaccine. He denies fever, chills, cough, sob, n/v, abd pain, urinary sxs, or abnormal BMs. He has a h/o asthma, no other medical problems, takes no medications daily. No h/o axillary abscesses or HS.   HPI     Past Medical History:  Diagnosis Date  . Asthma     There are no problems to display for this patient.   Past Surgical History:  Procedure Laterality Date  . ANKLE SURGERY    . SKIN GRAFT    . SKULL FRACTURE ELEVATION     pt unsure of specifics       No family history on file.  Social History   Tobacco Use  . Smoking status: Current Every Day Smoker    Packs/day: 2.00    Types: Cigarettes  . Smokeless tobacco: Never Used  Vaping Use  . Vaping Use: Never used  Substance Use Topics  . Alcohol use: Yes    Alcohol/week: 2.0 standard drinks    Types: 2 Standard drinks or equivalent per week    Comment: occasion  . Drug use: Yes    Types: Marijuana    Home Medications Prior to Admission medications   Medication Sig Start Date End Date Taking? Authorizing Provider  albuterol (PROVENTIL HFA;VENTOLIN HFA) 108 (90 Base) MCG/ACT inhaler Inhale 1-2 puffs into the lungs every 6 (six) hours as needed for wheezing or shortness of breath. 08/14/16   Pricilla Loveless, MD  HYDROcodone-acetaminophen (NORCO) 5-325 MG tablet Take 1 tablet by mouth every 4 (four) hours as needed for severe pain. 09/14/16   Loren Racer, MD  methocarbamol (ROBAXIN) 500 MG tablet Take 2 tablets (1,000 mg total) by mouth every 8 (eight) hours as needed for muscle  spasms. 09/14/16   Loren Racer, MD  naproxen (NAPROSYN) 500 MG tablet Take 1 tablet (500 mg total) by mouth 2 (two) times daily with a meal. 09/26/19   Zeven Kocak, PA-C  predniSONE (STERAPRED UNI-PAK 21 TAB) 10 MG (21) TBPK tablet Take by mouth daily. 6, 5, 4, 3, 2, 1 take as written 08/03/19   Petrucelli, Pleas Koch, PA-C    Allergies    Other  Review of Systems   Review of Systems  Skin:       L axillary discomfort    Physical Exam Updated Vital Signs BP 139/73 (BP Location: Left Arm)   Pulse 93   Temp 99.3 F (37.4 C) (Oral)   Resp 18   Ht 5\' 9"  (1.753 m)   Wt 94.3 kg   SpO2 97%   BMI 30.72 kg/m   Physical Exam Vitals and nursing note reviewed.  Constitutional:      General: He is not in acute distress.    Appearance: He is well-developed.     Comments: Resting in the bed in no acute distress  HENT:     Head: Normocephalic and atraumatic.  Eyes:     Conjunctiva/sclera: Conjunctivae normal.     Pupils: Pupils are equal, round, and reactive to light.  Cardiovascular:  Rate and Rhythm: Normal rate and regular rhythm.     Pulses: Normal pulses.  Pulmonary:     Effort: Pulmonary effort is normal. No respiratory distress.     Breath sounds: Normal breath sounds. No wheezing.     Comments: Clear lung sounds Abdominal:     General: There is no distension.     Palpations: Abdomen is soft. There is no mass.     Tenderness: There is no abdominal tenderness. There is no guarding or rebound.  Musculoskeletal:        General: Normal range of motion.     Cervical back: Normal range of motion and neck supple.     Comments: Mildly tender swollen lymph node on the left axilla.  It is mobile and irregularly shaped.  No skin induration or erythema.  No fluctuance to indicate fluid.  No other swollen lymph nodes.  Skin:    General: Skin is warm and dry.     Capillary Refill: Capillary refill takes less than 2 seconds.  Neurological:     Mental Status: He is alert  and oriented to person, place, and time.     ED Results / Procedures / Treatments   Labs (all labs ordered are listed, but only abnormal results are displayed) Labs Reviewed  SARS CORONAVIRUS 2 BY RT PCR (HOSPITAL ORDER, PERFORMED IN [facility])    EKG None  Radiology No results found.  Procedures Procedures (including critical care time)  Medications Ordered in ED Medications - No data to display  ED Course  I have reviewed the triage vital signs and the nursing notes.  Pertinent labs & imaging results that were available during my care of the patient were reviewed by me and considered in my medical decision making (see chart for details).    MDM Rules/Calculators/A&P                          Patient presenting for evaluation of discomfort in the left axilla.  On exam, patient has lymphadenopathy. Exam not c/w infection or abscess. Pt without active symptoms, however he was recently exposed to somebody with Covid.  Likely reactive, this is day 1.  Covid test is pending.  I discussed likely cause for lymphadenopathy, and symptomatic treatment with NSAIDs and warm compresses.  Encouraged patient to follow-up if swelling and discomfort persist for more than 2-3 weeks.  At this time, pt appears  safe for discharge. Return precautions given. Pt states he understands and agrees to plan.   [facility] was evaluated in Emergency Department on 09/26/2019 for the symptoms described in the history of present illness. He was evaluated in the context of the global COVID-19 pandemic, which necessitated consideration that the patient might be at risk for infection with the SARS-CoV-2 virus that causes COVID-19. Institutional protocols and algorithms that pertain to the evaluation of patients at risk for COVID-19 are in a state of rapid change based on information released by regulatory bodies including the CDC and federal and state organizations. These policies and algorithms were  followed during the patient's care in the ED.  [phone]: pts covid test came back positive. Pt still in the ED. I discussed results- importance of isolation and monitoring sxs. H/o asthma, does not have inhaler at home. Will give albuterol as needed.   Final Clinical Impression(s) / ED Diagnoses Final diagnoses:  Lymphadenopathy    Rx / DC Orders ED Discharge Orders  Ordered    naproxen (NAPROSYN) 500 MG tablet  2 times daily with meals        09/26/19 0442           Alveria Apley, PA-C 09/26/19 0451    Keylani Perlstein, PA-C 09/26/19 4097    Zadie Rhine, MD 09/26/19 [phone]

## 2019-09-26 NOTE — Discharge Instructions (Addendum)
Take naproxen 2 times a day with meals.  Do not take other anti-inflammatories at the same time (Advil, Motrin, ibuprofen, Aleve). You may supplement with Tylenol if you need further pain control. Use warm compresses 3 times a day to help with inflammation.   Your covid test was positive. You need to isolate at home for a total of 10 days. You may end isolation if you are fever free for 24 hours and symptoms are not worsening.   Return to the ER if you develop difficulty breathing, fevers, pus draining from the area, or any new, worsening, or concerning symptoms.

## 2019-09-26 NOTE — ED Notes (Signed)
Patient refusing vital signs

## 2019-10-05 ENCOUNTER — Other Ambulatory Visit: Payer: Self-pay

## 2019-10-05 ENCOUNTER — Emergency Department (HOSPITAL_COMMUNITY)
Admission: EM | Admit: 2019-10-05 | Discharge: 2019-10-05 | Disposition: A | Discharge status: Home / Self Care | Payer: Self-pay | Attending: Emergency Medicine | Admitting: Emergency Medicine

## 2019-10-05 ENCOUNTER — Encounter (HOSPITAL_COMMUNITY): Payer: Self-pay | Admitting: Emergency Medicine

## 2019-10-05 DIAGNOSIS — Z5321 Procedure and treatment not carried out due to patient leaving prior to being seen by health care provider: Secondary | ICD-10-CM | POA: Insufficient documentation

## 2019-10-05 DIAGNOSIS — R0602 Shortness of breath: Secondary | ICD-10-CM | POA: Insufficient documentation

## 2019-10-05 LAB — CBC WITH DIFFERENTIAL/PLATELET
Abs Immature Granulocytes: 0.06 10*3/uL (ref 0.00–0.07)
Basophils Absolute: 0.1 10*3/uL (ref 0.0–0.1)
Basophils Relative: 1 %
Eosinophils Absolute: 0.4 10*3/uL (ref 0.0–0.5)
Eosinophils Relative: 3 %
HCT: 39.7 % (ref 39.0–52.0)
Hemoglobin: 12.7 g/dL — ABNORMAL LOW (ref 13.0–17.0)
Immature Granulocytes: 0 %
Lymphocytes Relative: 16 %
Lymphs Abs: 2.3 10*3/uL (ref 0.7–4.0)
MCH: 31.1 pg (ref 26.0–34.0)
MCHC: 32 g/dL (ref 30.0–36.0)
MCV: 97.3 fL (ref 80.0–100.0)
Monocytes Absolute: 1.2 10*3/uL — ABNORMAL HIGH (ref 0.1–1.0)
Monocytes Relative: 8 %
Neutro Abs: 10.4 10*3/uL — ABNORMAL HIGH (ref 1.7–7.7)
Neutrophils Relative %: 72 %
Platelets: 475 10*3/uL — ABNORMAL HIGH (ref 150–400)
RBC: 4.08 MIL/uL — ABNORMAL LOW (ref 4.22–5.81)
RDW: 12 % (ref 11.5–15.5)
WBC: 14.4 10*3/uL — ABNORMAL HIGH (ref 4.0–10.5)
nRBC: 0 % (ref 0.0–0.2)

## 2019-10-05 LAB — BASIC METABOLIC PANEL
Anion gap: 10 (ref 5–15)
BUN: 11 mg/dL (ref 6–20)
CO2: 25 mmol/L (ref 22–32)
Calcium: 8.9 mg/dL (ref 8.9–10.3)
Chloride: 105 mmol/L (ref 98–111)
Creatinine, Ser: 1.08 mg/dL (ref 0.61–1.24)
GFR calc Af Amer: >60 mL/min (ref >60)
GFR calc non Af Amer: >60 mL/min (ref >60)
Glucose, Bld: 101 mg/dL — ABNORMAL HIGH (ref 70–99)
Potassium: 4.2 mmol/L (ref 3.5–5.1)
Sodium: 140 mmol/L (ref 135–145)

## 2019-10-05 NOTE — ED Triage Notes (Signed)
Pt states he started having SOB started last night, states he is not vaccinated for Covid.

## 2019-10-05 NOTE — ED Notes (Signed)
No answer multiple times

## 2019-11-09 ENCOUNTER — Emergency Department (HOSPITAL_BASED_OUTPATIENT_CLINIC_OR_DEPARTMENT_OTHER)
Admission: EM | Admit: 2019-11-09 | Discharge: 2019-11-09 | Disposition: A | Discharge status: Home / Self Care | Payer: Self-pay | Attending: Emergency Medicine | Admitting: Emergency Medicine

## 2019-11-09 ENCOUNTER — Encounter (HOSPITAL_BASED_OUTPATIENT_CLINIC_OR_DEPARTMENT_OTHER): Payer: Self-pay | Admitting: Emergency Medicine

## 2019-11-09 ENCOUNTER — Other Ambulatory Visit: Payer: Self-pay

## 2019-11-09 ENCOUNTER — Emergency Department (HOSPITAL_BASED_OUTPATIENT_CLINIC_OR_DEPARTMENT_OTHER): Payer: Self-pay

## 2019-11-09 DIAGNOSIS — R062 Wheezing: Secondary | ICD-10-CM

## 2019-11-09 DIAGNOSIS — F17210 Nicotine dependence, cigarettes, uncomplicated: Secondary | ICD-10-CM | POA: Insufficient documentation

## 2019-11-09 DIAGNOSIS — R0602 Shortness of breath: Secondary | ICD-10-CM

## 2019-11-09 DIAGNOSIS — J45901 Unspecified asthma with (acute) exacerbation: Secondary | ICD-10-CM | POA: Insufficient documentation

## 2019-11-09 MED ORDER — PREDNISONE 50 MG PO TABS
50.0000 mg | ORAL_TABLET | Freq: Every day | ORAL | 0 refills | Status: AC
Start: 2019-11-10 — End: 2019-11-14

## 2019-11-09 MED ORDER — PREDNISONE 50 MG PO TABS
60.0000 mg | ORAL_TABLET | Freq: Once | ORAL | Status: AC
  Administered 2019-11-09: 60 mg via ORAL
  Filled 2019-11-09: qty 1

## 2019-11-09 MED ORDER — ALBUTEROL SULFATE HFA 108 (90 BASE) MCG/ACT IN AERS
8.0000 | INHALATION_SPRAY | Freq: Once | RESPIRATORY_TRACT | Status: AC
  Administered 2019-11-09: 8 via RESPIRATORY_TRACT
  Filled 2019-11-09: qty 6.7

## 2019-11-09 MED FILL — predniSONE 50 MG TABS: 50 | 4 days supply | Qty: 4 | Fill #0

## 2019-11-09 NOTE — ED Triage Notes (Signed)
Pt reports shortness of breath since 0200. States hx asthma. Congested cough.

## 2019-11-09 NOTE — ED Provider Notes (Signed)
[facility] EMERGENCY DEPARTMENT Provider Note   CSN: [id number] Arrival date & time: 11/09/19  0645     History Chief Complaint  Patient presents with  . Shortness of Breath    KINSLER SOEDER is a 44 y.o. male.  The history is provided by the patient and medical records. No language interpreter was used.  Wheezing Severity:  Severe Severity compared to prior episodes:  Similar Onset quality:  Gradual Duration:  1 day Timing:  Constant Progression:  Waxing and waning Chronicity:  Recurrent Relieved by:  Beta-agonist inhaler Worsened by:  Nothing Associated symptoms: cough, shortness of breath and sputum production   Associated symptoms: no chest pain, no chest tightness, no fatigue, no fever, no headaches, no rash, no rhinorrhea and no stridor        Past Medical History:  Diagnosis Date  . Asthma     There are no problems to display for this patient.   Past Surgical History:  Procedure Laterality Date  . ANKLE SURGERY    . SKIN GRAFT    . SKULL FRACTURE ELEVATION     pt unsure of specifics       History reviewed. No pertinent family history.  Social History   Tobacco Use  . Smoking status: Current Every Day Smoker    Packs/day: 2.00    Types: Cigarettes  . Smokeless tobacco: Never Used  Vaping Use  . Vaping Use: Never used  Substance Use Topics  . Alcohol use: Yes    Alcohol/week: 2.0 standard drinks    Types: 2 Standard drinks or equivalent per week    Comment: occasion  . Drug use: Yes    Types: Marijuana    Home Medications Prior to Admission medications   Medication Sig Start Date End Date Taking? Authorizing Provider  albuterol (PROVENTIL HFA;VENTOLIN HFA) 108 (90 Base) MCG/ACT inhaler Inhale 1-2 puffs into the lungs every 6 (six) hours as needed for wheezing or shortness of breath. 08/14/16   Pricilla Loveless, MD  HYDROcodone-acetaminophen (NORCO) 5-325 MG tablet Take 1 tablet by mouth every 4 (four) hours as needed for severe  pain. 09/14/16   Loren Racer, MD  methocarbamol (ROBAXIN) 500 MG tablet Take 2 tablets (1,000 mg total) by mouth every 8 (eight) hours as needed for muscle spasms. 09/14/16   Loren Racer, MD  naproxen (NAPROSYN) 500 MG tablet Take 1 tablet (500 mg total) by mouth 2 (two) times daily with a meal. 09/26/19   Caccavale, Sophia, PA-C  predniSONE (STERAPRED UNI-PAK 21 TAB) 10 MG (21) TBPK tablet Take by mouth daily. 6, 5, 4, 3, 2, 1 take as written 08/03/19   Petrucelli, Samantha R, PA-C    Allergies    Other  Review of Systems   Review of Systems  Constitutional: Negative for chills, diaphoresis, fatigue and fever.  HENT: Negative for congestion and rhinorrhea.   Respiratory: Positive for cough, sputum production, shortness of breath and wheezing. Negative for chest tightness and stridor.   Cardiovascular: Negative for chest pain.  Gastrointestinal: Negative for abdominal pain, constipation, diarrhea, nausea and vomiting.  Genitourinary: Negative for flank pain and frequency.  Musculoskeletal: Negative for back pain, neck pain and neck stiffness.  Skin: Negative for rash and wound.  Neurological: Negative for weakness, light-headedness and headaches.  Psychiatric/Behavioral: Negative for agitation and confusion.  All other systems reviewed and are negative.   Physical Exam Updated Vital Signs BP (!) 155/99 (BP Location: Right Arm)   Pulse 91   Temp 98.2 F (  36.8 C) (Oral)   Resp (!) 22   Ht 5' 9.75" (1.772 m)   Wt 95.3 kg   SpO2 95%   BMI 30.35 kg/m   Physical Exam Vitals and nursing note reviewed.  Constitutional:      General: He is not in acute distress.    Appearance: He is well-developed. He is not ill-appearing or toxic-appearing.  HENT:     Head: Normocephalic and atraumatic.     Mouth/Throat:     Pharynx: Oropharynx is clear.  Eyes:     Conjunctiva/sclera: Conjunctivae normal.     Pupils: Pupils are equal, round, and reactive to light.  Cardiovascular:      Rate and Rhythm: Normal rate and regular rhythm.     Heart sounds: No murmur heard.   Pulmonary:     Effort: Pulmonary effort is normal. Tachypnea present. No respiratory distress.     Breath sounds: Wheezing and rhonchi present. No decreased breath sounds or rales.  Chest:     Chest wall: No tenderness.  Abdominal:     Palpations: Abdomen is soft.     Tenderness: There is no abdominal tenderness.  Musculoskeletal:     Cervical back: Neck supple.     Right lower leg: No tenderness. No edema.     Left lower leg: No tenderness. No edema.  Skin:    General: Skin is warm and dry.     Capillary Refill: Capillary refill takes less than 2 seconds.     Findings: No erythema.  Neurological:     General: No focal deficit present.     Mental Status: He is alert.  Psychiatric:        Mood and Affect: Mood normal.     ED Results / Procedures / Treatments   Labs (all labs ordered are listed, but only abnormal results are displayed) Labs Reviewed - No data to display  EKG None  Radiology DG Chest Portable 1 View  Result Date: 11/09/2019 CLINICAL DATA:  Shortness of breath, COVID 2 months ago EXAM: PORTABLE CHEST 1 VIEW COMPARISON:  08/28/2019 FINDINGS: Low lung volumes. No consolidation or edema. No pleural effusion or pneumothorax. Normal heart size. IMPRESSION: No acute process in the chest. Electronically Signed   By: Guadlupe Spanish M.D.   On: 11/09/2019 07:55    Procedures Procedures (including critical care time)  Medications Ordered in ED Medications  albuterol (VENTOLIN HFA) 108 (90 Base) MCG/ACT inhaler 8 puff (8 puffs Inhalation Given 11/09/19 0703)  predniSONE (DELTASONE) tablet 60 mg (60 mg Oral Given 11/09/19 6720)    ED Course  I have reviewed the triage vital signs and the nursing notes.  Pertinent labs & imaging results that were available during my care of the patient were reviewed by me and considered in my medical decision making (see chart for details).      MDM Rules/Calculators/A&P                          SHAY BARTOLI is a 44 y.o. male with a past medical history significant for asthma and COVID-19 infection 2 months ago who presents with shortness of breath overnight.  He reports that he ran out of his inhaler recently and had wheezing and coughing overnight.  He denies recent fevers, chills, congestion, nausea, vomiting, constipation, diarrhea, or other infectious symptoms.  He reports this feels similar to prior asthma exacerbations for him.  He did not have his inhaler and presents for  evaluation.  On arrival, he is having some wheezing and rhonchi.  He does report he said yellow sputum he reports this is really only been overnight.  He does say he see he had COVID-19 2 months ago thus he is still within side the 90-day window to not be retested at this time.  He denies any other Covid contacts recently.  He denies any chest pain or palpitations.  Denies any other complaints.  On my exam, patient is maintaining oxygen saturations in the mid 90s on room air.  He is slightly tachypneic on arrival but is not tachycardic.  He is not febrile.  His breath sounds had some wheezing and rhonchi bilaterally and he had just received 8 puffs of albuterol with RT.  Chest nontender abdomen nontender.  Good pulses.  Patient denying any other discomfort at this time.  We will get a chest x-ray to make sure there is no pneumothorax or other acute findings however since he is still within 90 days, will not retest for Covid.  Low suspicion for Covid recurrence at this time.  He denies any chest pain and he reports his breathing is already feeling better after the albuterol.  We will give some oral prednisone.  Will hold on other labs at this time.  Anticipate reassessment after chest x-ray and monitoring.  If work-up is reassuring, dissipate discharge home with his inhaler as well as prescription for steroid burst.  9:49 AM X-ray shows no pneumonia or pneumothorax.   Patient felt better after the breathing treatment and steroids.  Suspect asthma exacerbation primarily.  Oxygen saturations are now above 96% on room air.  We feel he is now safe for discharge home with prescription for steroids.  Patient review plan of care and follow-up instructions.  No other questions or concerns and was discharged in good condition with improved breathing.    Final Clinical Impression(s) / ED Diagnoses Final diagnoses:  Shortness of breath  Exacerbation of asthma, unspecified asthma severity, unspecified whether persistent  Wheezing    Rx / DC Orders ED Discharge Orders         Ordered    predniSONE (DELTASONE) 50 MG tablet  Daily        11/09/19 0952          Clinical Impression: 1. Shortness of breath   2. Exacerbation of asthma, unspecified asthma severity, unspecified whether persistent   3. Wheezing     Disposition: Discharge  Condition: Good  I have discussed the results, Dx and Tx plan with the pt(& family if present). He/she/they expressed understanding and agree(s) with the plan. Discharge instructions discussed at great length. Strict return precautions discussed and pt &/or family have verbalized understanding of the instructions. No further questions at time of discharge.    New Prescriptions   PREDNISONE (DELTASONE) 50 MG TABLET    Take 1 tablet (50 mg total) by mouth daily for 4 days.    Follow Up: No follow-up provider specified.    Malea Swilling, Canary Brim, MD 11/09/19 [phone]

## 2019-11-09 NOTE — Discharge Instructions (Signed)
Your work-up today did not show evidence of pneumonia or collapsed lung.  I suspect your symptoms were related to asthma exacerbation.  Please use the steroids for the next few days starting tomorrow as you already received today's dose.  Please rest and stay hydrated.  If any symptoms change or worsen, please return to the nearest emergency department.

## 2020-01-27 ENCOUNTER — Emergency Department (HOSPITAL_COMMUNITY)
Admission: EM | Admit: 2020-01-27 | Discharge: 2020-01-27 | Disposition: A | Discharge status: Home / Self Care | Payer: Self-pay | Attending: Emergency Medicine | Admitting: Emergency Medicine

## 2020-01-27 ENCOUNTER — Emergency Department (HOSPITAL_COMMUNITY): Payer: Self-pay

## 2020-01-27 ENCOUNTER — Other Ambulatory Visit: Payer: Self-pay

## 2020-01-27 ENCOUNTER — Encounter (HOSPITAL_COMMUNITY): Payer: Self-pay | Admitting: Emergency Medicine

## 2020-01-27 DIAGNOSIS — F17210 Nicotine dependence, cigarettes, uncomplicated: Secondary | ICD-10-CM | POA: Insufficient documentation

## 2020-01-27 DIAGNOSIS — J4541 Moderate persistent asthma with (acute) exacerbation: Secondary | ICD-10-CM | POA: Insufficient documentation

## 2020-01-27 DIAGNOSIS — Z20822 Contact with and (suspected) exposure to covid-19: Secondary | ICD-10-CM | POA: Insufficient documentation

## 2020-01-27 LAB — COMPREHENSIVE METABOLIC PANEL
ALT: 12 U/L (ref 0–44)
AST: 17 U/L (ref 15–41)
Albumin: 3 g/dL — ABNORMAL LOW (ref 3.5–5.0)
Alkaline Phosphatase: 79 U/L (ref 38–126)
Anion gap: 7 (ref 5–15)
BUN: 8 mg/dL (ref 6–20)
CO2: 27 mmol/L (ref 22–32)
Calcium: 8.5 mg/dL — ABNORMAL LOW (ref 8.9–10.3)
Chloride: 105 mmol/L (ref 98–111)
Creatinine, Ser: 1.03 mg/dL (ref 0.61–1.24)
GFR, Estimated: >60 mL/min (ref >60)
Glucose, Bld: 91 mg/dL (ref 70–99)
Potassium: 4.5 mmol/L (ref 3.5–5.1)
Sodium: 139 mmol/L (ref 135–145)
Total Bilirubin: 1 mg/dL (ref 0.3–1.2)
Total Protein: 5.6 g/dL — ABNORMAL LOW (ref 6.5–8.1)

## 2020-01-27 LAB — CBC WITH DIFFERENTIAL/PLATELET
Abs Immature Granulocytes: 0.05 10*3/uL (ref 0.00–0.07)
Basophils Absolute: 0.1 10*3/uL (ref 0.0–0.1)
Basophils Relative: 1 %
Eosinophils Absolute: 0.6 10*3/uL — ABNORMAL HIGH (ref 0.0–0.5)
Eosinophils Relative: 6 %
HCT: 43.7 % (ref 39.0–52.0)
Hemoglobin: 14.2 g/dL (ref 13.0–17.0)
Immature Granulocytes: 1 %
Lymphocytes Relative: 22 %
Lymphs Abs: 2.4 10*3/uL (ref 0.7–4.0)
MCH: 31.7 pg (ref 26.0–34.0)
MCHC: 32.5 g/dL (ref 30.0–36.0)
MCV: 97.5 fL (ref 80.0–100.0)
Monocytes Absolute: 0.7 10*3/uL (ref 0.1–1.0)
Monocytes Relative: 7 %
Neutro Abs: 6.9 10*3/uL (ref 1.7–7.7)
Neutrophils Relative %: 63 %
Platelets: 314 10*3/uL (ref 150–400)
RBC: 4.48 MIL/uL (ref 4.22–5.81)
RDW: 12.3 % (ref 11.5–15.5)
WBC: 10.8 10*3/uL — ABNORMAL HIGH (ref 4.0–10.5)
nRBC: 0 % (ref 0.0–0.2)

## 2020-01-27 LAB — TROPONIN I (HIGH SENSITIVITY): Troponin I (High Sensitivity): 6 ng/L (ref <18)

## 2020-01-27 LAB — RESP PANEL BY RT-PCR (FLU A&B, COVID) ARPGX2
Influenza A by PCR: NEGATIVE
Influenza B by PCR: NEGATIVE
SARS Coronavirus 2 by RT PCR: NEGATIVE

## 2020-01-27 MED ORDER — LIDOCAINE 4 % EX PTCH: 1.0000 | MEDICATED_PATCH | CUTANEOUS | 0 refills | Status: AC

## 2020-01-27 MED ORDER — CYCLOBENZAPRINE HCL 10 MG PO TABS: 10.0000 mg | ORAL_TABLET | Freq: Two times a day (BID) | ORAL | 0 refills | Status: AC | PRN

## 2020-01-27 MED ORDER — ALBUTEROL SULFATE HFA 108 (90 BASE) MCG/ACT IN AERS: 1.0000 | INHALATION_SPRAY | Freq: Four times a day (QID) | RESPIRATORY_TRACT | 0 refills | Status: DC | PRN

## 2020-01-27 MED ORDER — ALBUTEROL SULFATE HFA 108 (90 BASE) MCG/ACT IN AERS
2.0000 | INHALATION_SPRAY | Freq: Once | RESPIRATORY_TRACT | Status: AC
  Administered 2020-01-27: 2 via RESPIRATORY_TRACT
  Filled 2020-01-27: qty 6.7

## 2020-01-27 MED ORDER — IPRATROPIUM-ALBUTEROL 0.5-2.5 (3) MG/3ML IN SOLN
3.0000 mL | RESPIRATORY_TRACT | Status: AC
  Administered 2020-01-27 (×2): 3 mL via RESPIRATORY_TRACT
  Filled 2020-01-27: qty 3

## 2020-01-27 MED ORDER — PREDNISONE 10 MG PO TABS: ORAL_TABLET | ORAL | 0 refills | Status: AC

## 2020-01-27 NOTE — ED Triage Notes (Signed)
Patient arrives to ED with c/o his asthma worsening today. States hes had to use his inhaler more than normal this week and today pt felt as if the inhaler gave no relief.

## 2020-01-27 NOTE — Discharge Instructions (Addendum)
Thank you for choosing [facility] for your care today.  To do: I have sent prescriptions for a course of prednisone as well as some muscle relaxants and lidocaine patches to help with your breathing symptoms as well as your back pain.  I have also included a refill for your albuterol inhaler.  If you have not seen your pulmonologist in some time, you may want to set up a follow-up appointment with pulmonology to make sure there are no changes needed to your asthma regimen. Please follow up with your primary care doctor in the next few days. If you do not have a PCP you are established with, you can call [phone]  or [phone]  to access [facility] Find A Doctor service. You can also visit [email or web address] Please come back to the Emergency Department if you have shortness of breath, chest pain, confusion/mental status changes, if you have so much nausea/vomiting that you cannot keep down fluids, or if you have any other symptoms that worry you.  Take care. Hope you start feeling better soon.  Corliss Blacker, MD Emergency Medicine

## 2020-01-27 NOTE — ED Notes (Signed)
Pt d/c by MD, Pt is provided w/ d/c instructions and follow up care, pt is out of the ED ambulatory by self

## 2020-01-27 NOTE — ED Provider Notes (Signed)
[facility] EMERGENCY DEPARTMENT Provider Note   CSN: [id number] Arrival date & time: 01/27/20  1524     History Chief Complaint  Patient presents with  . Asthma    Keith Henry is a 44 y.o. male.  HPI Patient is a 44 year old male with a history of asthma who presents to ED with complaint of shortness of breath.  Patient states that he has been having dyspnea for the last several days which he feels is an asthma exacerbation, and has been using his albuterol inhaler more than usual.  Earlier today, he was not having much relief with the albuterol inhaler and so wanted to be evaluated. However, he also states that he was unable to get to the hospital sooner/earlier today because "my back went out" and he had difficulty getting out of bed.  He currently complains of back pain and chest pressure.  He denies any recent injuries to his back but says he has chronic low back pain that is worse today, described as pulsating, currently 8/10 in intensity.  He describes the chest discomfort as related to his asthma and says that it is currently a 5/10 in intensity, described as "an elephant sitting on my chest."  Has not tried any other remedies besides the albuterol inhaler.  Has not been vaccinated against COVID-19 but did have SARS-CoV-2 earlier this year, with positive test result in August 2021.    Past Medical History:  Diagnosis Date  . Asthma     There are no problems to display for this patient.   Past Surgical History:  Procedure Laterality Date  . ANKLE SURGERY    . SKIN GRAFT    . SKULL FRACTURE ELEVATION     pt unsure of specifics       History reviewed. No pertinent family history.  Social History   Tobacco Use  . Smoking status: Current Every Day Smoker    Packs/day: 2.00    Types: Cigarettes  . Smokeless tobacco: Never Used  Vaping Use  . Vaping Use: Never used  Substance Use Topics  . Alcohol use: Yes    Alcohol/week: 2.0 standard drinks     Types: 2 Standard drinks or equivalent per week    Comment: occasion  . Drug use: Yes    Types: Marijuana    Home Medications Prior to Admission medications   Medication Sig Start Date End Date Taking? Authorizing Provider  albuterol (VENTOLIN HFA) 108 (90 Base) MCG/ACT inhaler Inhale 1-2 puffs into the lungs every 6 (six) hours as needed for wheezing or shortness of breath. 01/27/20  Yes Corliss Blacker, MD  HYDROcodone-acetaminophen (NORCO) 5-325 MG tablet Take 1 tablet by mouth every 4 (four) hours as needed for severe pain. 09/14/16  Yes Loren Racer, MD  naproxen (NAPROSYN) 500 MG tablet Take 1 tablet (500 mg total) by mouth 2 (two) times daily with a meal. 09/26/19  Yes Caccavale, Sophia, PA-C  cyclobenzaprine (FLEXERIL) 10 MG tablet Take 1 tablet (10 mg total) by mouth 2 (two) times daily as needed for up to 5 days for muscle spasms. 01/27/20 02/01/20  Corliss Blacker, MD  Lidocaine (HM LIDOCAINE PATCH) 4 % PTCH Apply 1 patch topically every 24 hours x 2 doses for 3 doses. 01/27/20 01/30/20  Corliss Blacker, MD  predniSONE (DELTASONE) 10 MG tablet Take 4 tablets (40 mg total) by mouth daily for 2 days, THEN 3 tablets (30 mg total) daily for 2 days, THEN 2 tablets (20 mg total) daily for  2 days, THEN 1 tablet (10 mg total) daily for 2 days. 01/27/20 02/04/20  Corliss Blacker, MD    Allergies    Other and Methylprednisolone sodium succ  Review of Systems   Review of Systems  Constitutional: Negative for chills, fever and unexpected weight change.  HENT: Negative for ear pain, rhinorrhea and sore throat.   Eyes: Negative for pain and visual disturbance.  Respiratory: Positive for chest tightness, shortness of breath and wheezing. Negative for cough.   Cardiovascular: Positive for chest pain. Negative for leg swelling.  Gastrointestinal: Negative for abdominal pain, blood in stool, diarrhea, nausea and vomiting.  Genitourinary: Negative for dysuria and hematuria.  Musculoskeletal:  Positive for back pain. Negative for joint swelling.  Skin: Negative for color change and rash.  Neurological: Negative for syncope, weakness, light-headedness and headaches.  Psychiatric/Behavioral: Negative for confusion and suicidal ideas.  All other systems reviewed and are negative.   Physical Exam Updated Vital Signs BP 133/80   Pulse 82   Temp 98.6 F (37 C) (Oral)   Resp 18   Ht 5' 9.75" (1.772 m)   Wt 85.3 kg   SpO2 97%   BMI 27.17 kg/m   Physical Exam Vitals and nursing note reviewed.  Constitutional:      General: He is not in acute distress.    Appearance: He is well-developed and well-nourished. He is ill-appearing. He is not toxic-appearing.  HENT:     Head: Normocephalic and atraumatic.     Nose: Nose normal.     Mouth/Throat:     Mouth: Mucous membranes are moist.     Pharynx: Oropharynx is clear.  Eyes:     General: No scleral icterus.    Extraocular Movements: Extraocular movements intact.  Cardiovascular:     Rate and Rhythm: Normal rate and regular rhythm.     Pulses: Normal pulses.     Heart sounds: Murmur heard.  No friction rub. No gallop.   Pulmonary:     Effort: Pulmonary effort is normal. No respiratory distress.     Breath sounds: Wheezing present.  Abdominal:     Palpations: Abdomen is soft.     Tenderness: There is no abdominal tenderness. There is no guarding or rebound.  Musculoskeletal:        General: No edema.     Cervical back: Neck supple.     Right lower leg: No edema.     Left lower leg: No edema.     Comments: Tenderness to palpation in a band across the low lumbar/sacral spine, not significantly worse over the midline.  Skin:    General: Skin is warm and dry.  Neurological:     Mental Status: He is alert.     Comments: Alert, grossly oriented, moves all extremities spontaneously  Psychiatric:        Mood and Affect: Mood and affect normal.     ED Results / Procedures / Treatments   Labs (all labs ordered are  listed, but only abnormal results are displayed) Labs Reviewed  CBC WITH DIFFERENTIAL/PLATELET - Abnormal; Notable for the following components:      Result Value   WBC 10.8 (*)    Eosinophils Absolute 0.6 (*)    All other components within normal limits  COMPREHENSIVE METABOLIC PANEL - Abnormal; Notable for the following components:   Calcium 8.5 (*)    Total Protein 5.6 (*)    Albumin 3.0 (*)    All other components within normal limits  RESP PANEL  BY RT-PCR (FLU A&B, COVID) [id number]  TROPONIN I (HIGH SENSITIVITY)    EKG EKG Interpretation  Date/Time:  Friday January 27 2020 17:59:22 EST Ventricular Rate:  68 PR Interval:    QRS Duration: 80 QT Interval:  380 QTC Calculation: 405 R Axis:   73 Text Interpretation: Sinus rhythm RSR' in V1 or V2, right VCD or RVH No significant change since last tracing Confirmed by Alvira Monday ([id number]) on 01/27/2020 6:14:31 PM   Radiology DG Lumbar Spine 2-3 Views  Result Date: 01/27/2020 CLINICAL DATA:  Back pain EXAM: LUMBAR SPINE - 2-3 VIEW COMPARISON:  09/14/2016 FINDINGS: Frontal and lateral views of the lumbar spine are obtained. There are 5 non-rib-bearing lumbar type vertebral bodies in grossly normal alignment. No acute displaced fractures. Mild spondylosis at L1/L2. Mild facet hypertrophy at L4/L5 and L5/S1. Sacroiliac joints are normal. IMPRESSION: 1. Mild upper lumbar spondylosis. 2. Prominent facet hypertrophy at the lumbosacral junction. 3. No acute fracture. Electronically Signed   By: Sharlet Salina M.D.   On: 01/27/2020 19:53   DG Chest Portable 1 View  Result Date: 01/27/2020 CLINICAL DATA:  Dyspnea, back pain EXAM: PORTABLE CHEST 1 VIEW COMPARISON:  12/21/2019 FINDINGS: The heart size and mediastinal contours are within normal limits. Both lungs are clear. The visualized skeletal structures are unremarkable. IMPRESSION: No active disease. Electronically Signed   By: Sharlet Salina M.D.   On: 01/27/2020 19:53     Procedures Procedures (including critical care time)  Medications Ordered in ED Medications  ipratropium-albuterol (DUONEB) 0.5-2.5 (3) MG/3ML nebulizer solution 3 mL (3 mLs Nebulization Given 01/27/20 1747)  albuterol (VENTOLIN HFA) 108 (90 Base) MCG/ACT inhaler 2 puff (2 puffs Inhalation Given 01/27/20 2102)    ED Course  I have reviewed the triage vital signs and the nursing notes.  Pertinent labs & imaging results that were available during my care of the patient were reviewed by me and considered in my medical decision making (see chart for details).    MDM Rules/Calculators/A&P                         44 y/o male with shortness of breath, concern for asthma exacerbation. Also complains of chest pain and back pain.  Normal sensation and strength in the lower extremities on exam.  Will order 2 back-to-back DuoNeb treatments and reassess.  Differentials considered: I am most concerned for asthma exacerbation, but differential includes ACS (less likely), viral illness including COVID-19, CAP, CHF, COPD. No red flag back pain symptoms - no extremity weakness, no saddle anesthesia, no bowel/bladder incontinence, no recent fevers/night sweats or unintentional weight loss.  No recent trauma or injuries to the back. No extremity swelling or redness, no recent travel, no estrogen containing medications, no history of clots, no hemoptysis to increased suspicion for PE. ED provider interpretation of lab results: Mild leukocytosis, no anemia, unremarkable CMP.  Troponin 6.  COVID-19 and flu A/B are negative. ED provider interpretation of imaging results: Interpreted by radiology and images personally reviewed. CXR without acute findings.  XR lumbar spine showing degenerative changes but without any acute fracture; there is mild facet hypertrophy throughout the lower lumbar spine with normal SI joints. ED provider interpretation of ECG: Sinus rhythm with a rate of 68.  QRS 80, QTc 4 5.  There is  no STEMI or evidence of acute ischemia/infarct.  MDM: Following initial breathing treatments, patient states he is feeling much better.  Wheezing is significantly improved and lungs are  now clear on exam.  Patient is resting comfortably.  Imaging shows no evidence of active pulmonary disease or acute traumatic injury to the lumbar or sacral spine.  He denies any red flag symptoms to indicate cauda equina and there are no findings on exam or imaging to suggest vertebral fracture, or acute ligamentous injury.  Impression is likely asthma exacerbation with concurrent muscular strain exacerbating chronic low back pain. Will prescribe prednisone taper, flexeril, lidocaine patches, and refill of albuterol inhaler upon discharge. Encouraged him to follow up with PCP and pulmonology to review his asthma control regimen.  At this time, pt appears safe for discharge with strict return precautions provided and instructions for follow up given. Provided contact information for local pulmonary care group. Pt voiced understanding and is amenable to this plan. Pt discharged in stable condition.  Final Clinical Impression(s) / ED Diagnoses Final diagnoses:  Moderate persistent asthma with exacerbation    Rx / DC Orders ED Discharge Orders         Ordered    predniSONE (DELTASONE) 10 MG tablet        01/27/20 2034    cyclobenzaprine (FLEXERIL) 10 MG tablet  2 times daily PRN        01/27/20 2034    Lidocaine (HM LIDOCAINE PATCH) 4 % PTCH  Every 24 hr x 2        01/27/20 2034    albuterol (VENTOLIN HFA) 108 (90 Base) MCG/ACT inhaler  Every 6 hours PRN        01/27/20 2034           Corliss BlackerBishop, Lynett Brasil, MD 01/28/20 [id number]    Alvira MondaySchlossman, Erin, MD 01/29/20 [phone]

## 2020-02-27 ENCOUNTER — Emergency Department (HOSPITAL_COMMUNITY)
Admission: EM | Admit: 2020-02-27 | Discharge: 2020-02-28 | Disposition: A | Discharge status: Home / Self Care | Payer: Self-pay | Attending: Emergency Medicine | Admitting: Emergency Medicine

## 2020-02-27 ENCOUNTER — Encounter (HOSPITAL_COMMUNITY): Payer: Self-pay | Admitting: *Deleted

## 2020-02-27 ENCOUNTER — Other Ambulatory Visit: Payer: Self-pay

## 2020-02-27 DIAGNOSIS — Z5321 Procedure and treatment not carried out due to patient leaving prior to being seen by health care provider: Secondary | ICD-10-CM | POA: Insufficient documentation

## 2020-02-27 DIAGNOSIS — M545 Low back pain, unspecified: Secondary | ICD-10-CM | POA: Insufficient documentation

## 2020-02-27 NOTE — ED Triage Notes (Signed)
Pt woke up this am states he could not move at all and was having lower back pain. Took 45 minutes until he could get up. No recent injury.

## 2020-02-28 NOTE — ED Notes (Signed)
Patient called for vitals with no response and not visible in lobby 

## 2020-03-16 ENCOUNTER — Encounter (HOSPITAL_COMMUNITY): Payer: Self-pay | Admitting: Emergency Medicine

## 2020-03-16 ENCOUNTER — Emergency Department (HOSPITAL_COMMUNITY)
Admission: EM | Admit: 2020-03-16 | Discharge: 2020-03-16 | Disposition: A | Discharge status: Home / Self Care | Payer: Self-pay | Attending: Emergency Medicine | Admitting: Emergency Medicine

## 2020-03-16 ENCOUNTER — Other Ambulatory Visit: Payer: Self-pay

## 2020-03-16 DIAGNOSIS — J4541 Moderate persistent asthma with (acute) exacerbation: Secondary | ICD-10-CM

## 2020-03-16 DIAGNOSIS — J45909 Unspecified asthma, uncomplicated: Secondary | ICD-10-CM | POA: Insufficient documentation

## 2020-03-16 DIAGNOSIS — R0602 Shortness of breath: Secondary | ICD-10-CM | POA: Insufficient documentation

## 2020-03-16 DIAGNOSIS — M545 Low back pain, unspecified: Secondary | ICD-10-CM | POA: Insufficient documentation

## 2020-03-16 DIAGNOSIS — F17210 Nicotine dependence, cigarettes, uncomplicated: Secondary | ICD-10-CM | POA: Insufficient documentation

## 2020-03-16 MED ORDER — PREDNISONE 20 MG PO TABS
40.0000 mg | ORAL_TABLET | Freq: Once | ORAL | Status: AC
  Administered 2020-03-16: 40 mg via ORAL
  Filled 2020-03-16: qty 2

## 2020-03-16 MED ORDER — KETOROLAC TROMETHAMINE 60 MG/2ML IM SOLN
60.0000 mg | Freq: Once | INTRAMUSCULAR | Status: AC
  Administered 2020-03-16: 60 mg via INTRAMUSCULAR
  Filled 2020-03-16: qty 2

## 2020-03-16 MED ORDER — ALBUTEROL SULFATE HFA 108 (90 BASE) MCG/ACT IN AERS
4.0000 | INHALATION_SPRAY | Freq: Once | RESPIRATORY_TRACT | Status: AC
  Administered 2020-03-16: 4 via RESPIRATORY_TRACT
  Filled 2020-03-16: qty 6.7

## 2020-03-16 MED ORDER — PREDNISONE 10 MG PO TABS: 20.0000 mg | ORAL_TABLET | Freq: Two times a day (BID) | ORAL | 0 refills | Status: DC

## 2020-03-16 MED ORDER — CYCLOBENZAPRINE HCL 10 MG PO TABS: 10.0000 mg | ORAL_TABLET | Freq: Three times a day (TID) | ORAL | 0 refills | Status: DC | PRN

## 2020-03-16 NOTE — ED Triage Notes (Addendum)
Patient reports asthma attack with wheezing  onset yesterday unrelieved by inhaler , no cough or fever , patient added low back pain .

## 2020-03-16 NOTE — ED Provider Notes (Signed)
[facility] EMERGENCY DEPARTMENT Provider Note   CSN: [id number] Arrival date & time: 03/16/20  0303     History Chief Complaint  Patient presents with  . Asthma / Back pain     Keith Henry is a 45 y.o. male.  Patient is a 45 year old male with history of asthma.  He presents today for evaluation of shortness of breath, wheezing that started 2 days ago.  Patient denies any fevers or chills.  He denies any chest pain.  He has an albuterol inhaler that he has been trying with little relief, but believes it may be out.  The history is provided by the patient.       Past Medical History:  Diagnosis Date  . Asthma     There are no problems to display for this patient.   Past Surgical History:  Procedure Laterality Date  . ANKLE SURGERY    . SKIN GRAFT    . SKULL FRACTURE ELEVATION     pt unsure of specifics       No family history on file.  Social History   Tobacco Use  . Smoking status: Current Every Day Smoker    Packs/day: 2.00    Types: Cigarettes  . Smokeless tobacco: Never Used  Vaping Use  . Vaping Use: Never used  Substance Use Topics  . Alcohol use: Yes    Alcohol/week: 2.0 standard drinks    Types: 2 Standard drinks or equivalent per week    Comment: occasion  . Drug use: Yes    Types: Marijuana    Home Medications Prior to Admission medications   Medication Sig Start Date End Date Taking? Authorizing Provider  albuterol (VENTOLIN HFA) 108 (90 Base) MCG/ACT inhaler Inhale 1-2 puffs into the lungs every 6 (six) hours as needed for wheezing or shortness of breath. 01/27/20   Corliss Blacker, MD  HYDROcodone-acetaminophen (NORCO) 5-325 MG tablet Take 1 tablet by mouth every 4 (four) hours as needed for severe pain. 09/14/16   Loren Racer, MD  naproxen (NAPROSYN) 500 MG tablet Take 1 tablet (500 mg total) by mouth 2 (two) times daily with a meal. 09/26/19   Caccavale, Sophia, PA-C    Allergies    Other and  Methylprednisolone sodium succ  Review of Systems   Review of Systems  All other systems reviewed and are negative.   Physical Exam Updated Vital Signs BP 131/82 (BP Location: Left Arm)   Pulse 90   Temp 98.2 F (36.8 C)   Resp (!) 22   SpO2 97%   Physical Exam Vitals and nursing note reviewed.  Constitutional:      General: He is not in acute distress.    Appearance: He is well-developed and well-nourished. He is not diaphoretic.  HENT:     Head: Normocephalic and atraumatic.     Mouth/Throat:     Mouth: Oropharynx is clear and moist.  Cardiovascular:     Rate and Rhythm: Normal rate and regular rhythm.     Heart sounds: No murmur heard. No friction rub.  Pulmonary:     Effort: Pulmonary effort is normal. No respiratory distress.     Breath sounds: Wheezing present. No rales.  Abdominal:     General: Bowel sounds are normal. There is no distension.     Palpations: Abdomen is soft.     Tenderness: There is no abdominal tenderness.  Musculoskeletal:        General: No edema. Normal range of motion.  Cervical back: Normal range of motion and neck supple.  Skin:    General: Skin is warm and dry.  Neurological:     Mental Status: He is alert and oriented to person, place, and time.     Coordination: Coordination normal.     ED Results / Procedures / Treatments   Labs (all labs ordered are listed, but only abnormal results are displayed) Labs Reviewed - No data to display  EKG None  Radiology No results found.  Procedures Procedures   Medications Ordered in ED Medications  predniSONE (DELTASONE) tablet 40 mg (has no administration in time range)  albuterol (VENTOLIN HFA) 108 (90 Base) MCG/ACT inhaler 4 puff (has no administration in time range)    ED Course  I have reviewed the triage vital signs and the nursing notes.  Pertinent labs & imaging results that were available during my care of the patient were reviewed by me and considered in my medical  decision making (see chart for details).    MDM Rules/Calculators/A&P  Patient with history of asthma presenting with complaints of shortness of breath and back pain.  He arrives here with wheezing on exam, but no respiratory distress.  Oxygen saturations are adequate.  Patient given prednisone and albuterol by inhaler.  He is now feeling better and sounds better as well.  Patient also having low back pain.  There are no red flags that would suggest an emergent situation.  Reflexes and strength are symmetrical and there are no bowel or bladder complaints.  At this point, I feel as though discharge is appropriate.  Patient will be treated with prednisone which I feel may help both his back and breathing.  He will also be given albuterol MDI and Flexeril.  He is to return as needed if symptoms worsen.  Final Clinical Impression(s) / ED Diagnoses Final diagnoses:  None    Rx / DC Orders ED Discharge Orders    None       Geoffery Lyons, MD 03/16/20 [phone]

## 2020-03-16 NOTE — Discharge Instructions (Addendum)
Begin taking prednisone as prescribed.  Begin taking Flexeril as prescribed as needed for pain.  Use your albuterol inhaler, 2 puffs every 4 hours as needed for wheezing.  Return to the ER if symptoms significantly worsen or change.

## 2020-03-19 ENCOUNTER — Institutional Professional Consult (permissible substitution): Payer: Self-pay | Admitting: Pulmonary Disease

## 2020-03-19 NOTE — Progress Notes (Deleted)
Subjective:   PATIENT ID: Keith Henry GENDER: male DOB: Mar 29, 1975, MRN: [id number]   HPI  No chief complaint on file.   Reason for Visit: New consult for ***  Mr. Keith Henry is a 45 year old male ***  He was evaluated in the ED on 01/27/2020 for asthma exacerbation.  He was treated with albuterol and duo nebs.  Given prednisone taper and referred to pulmonary for further management of Social History:  Environmental exposures: ***  I have personally reviewed patient's past medical/family/social history, allergies, current medications.***  Past Medical History:  Diagnosis Date  . Asthma      No family history on file.   Social History   Occupational History  . Not on file  Tobacco Use  . Smoking status: Current Every Day Smoker    Packs/day: 2.00    Types: Cigarettes  . Smokeless tobacco: Never Used  Vaping Use  . Vaping Use: Never used  Substance and Sexual Activity  . Alcohol use: Yes    Alcohol/week: 2.0 standard drinks    Types: 2 Standard drinks or equivalent per week    Comment: occasion  . Drug use: Yes    Types: Marijuana  . Sexual activity: Not on file    Allergies  Allergen Reactions  . Other Anaphylaxis    Pork  . Methylprednisolone Sodium Succ Nausea And Vomiting     Outpatient Medications Prior to Visit  Medication Sig Dispense Refill  . albuterol (VENTOLIN HFA) 108 (90 Base) MCG/ACT inhaler Inhale 1-2 puffs into the lungs every 6 (six) hours as needed for wheezing or shortness of breath. 1 each 0  . cyclobenzaprine (FLEXERIL) 10 MG tablet Take 1 tablet (10 mg total) by mouth 3 (three) times daily as needed for muscle spasms. 15 tablet 0  . HYDROcodone-acetaminophen (NORCO) 5-325 MG tablet Take 1 tablet by mouth every 4 (four) hours as needed for severe pain. 15 tablet 0  . naproxen (NAPROSYN) 500 MG tablet Take 1 tablet (500 mg total) by mouth 2 (two) times daily with a meal. 20 tablet 0  . predniSONE (DELTASONE) 10 MG tablet Take 2  tablets (20 mg total) by mouth 2 (two) times daily with a meal. 20 tablet 0   No facility-administered medications prior to visit.    ROS   Objective:  There were no vitals filed for this visit.    Physical Exam: General: Well-appearing, no acute distress HENT: [place], AT, OP clear, MMM Eyes: EOMI, no scleral icterus Respiratory: Clear to auscultation bilaterally.  No crackles, wheezing or rales Cardiovascular: RRR, -M/R/G, no JVD GI: BS+, soft, nontender Extremities:-Edema,-tenderness Neuro: AAO x4, CNII-XII grossly intact Skin: Intact, no rashes or bruising Psych: Normal mood, normal affect  Data Reviewed:  Imaging: CXR 01/27/2020-no infiltrate, effusion or edema CT chest 09/14/2016-normal lung parenchyma, mild/minimal bronchial thickening of proximal airways  PFT: None on file  Labs: CBC    Component Value Date/Time   WBC 10.8 (H) 01/27/2020 1756   RBC 4.48 01/27/2020 1756   HGB 14.2 01/27/2020 1756   HCT 43.7 01/27/2020 1756   PLT 314 01/27/2020 1756   MCV 97.5 01/27/2020 1756   MCH 31.7 01/27/2020 1756   MCHC 32.5 01/27/2020 1756   RDW 12.3 01/27/2020 1756   LYMPHSABS 2.4 01/27/2020 1756   MONOABS 0.7 01/27/2020 1756   EOSABS 0.6 (H) 01/27/2020 1756   BASOSABS 0.1 01/27/2020 1756  Absolute eosinophil 01/27/2020-600  Imaging, labs and tests noted above have been reviewed independently by me.  Assessment & Plan:   Discussion: 45 year old male with asthma who presents to establish care with pulmonary clinic.  Health Maintenance  There is no immunization history on file for this patient. CT Lung Screen-not appropriate age yet  No orders of the defined types were placed in this encounter. No orders of the defined types were placed in this encounter.   No follow-ups on file.  I have spent a total time of***-minutes on the day of the appointment reviewing prior documentation, coordinating care and discussing medical diagnosis and plan with the  patient/family. Imaging, labs and tests included in this note have been reviewed and interpreted independently by me.  Freddi Forster Mechele Collin, MD [facility] 03/19/2020 9:00 AM  Office Number [phone]

## 2020-03-22 ENCOUNTER — Other Ambulatory Visit: Payer: Self-pay

## 2020-03-22 ENCOUNTER — Encounter (HOSPITAL_COMMUNITY): Payer: Self-pay

## 2020-03-22 ENCOUNTER — Emergency Department (HOSPITAL_COMMUNITY)
Admission: EM | Admit: 2020-03-22 | Discharge: 2020-03-22 | Disposition: A | Discharge status: Home / Self Care | Payer: Self-pay | Attending: Emergency Medicine | Admitting: Emergency Medicine

## 2020-03-22 DIAGNOSIS — F17210 Nicotine dependence, cigarettes, uncomplicated: Secondary | ICD-10-CM | POA: Insufficient documentation

## 2020-03-22 DIAGNOSIS — M5442 Lumbago with sciatica, left side: Secondary | ICD-10-CM | POA: Insufficient documentation

## 2020-03-22 DIAGNOSIS — J45909 Unspecified asthma, uncomplicated: Secondary | ICD-10-CM | POA: Insufficient documentation

## 2020-03-22 DIAGNOSIS — G8929 Other chronic pain: Secondary | ICD-10-CM | POA: Insufficient documentation

## 2020-03-22 MED ORDER — PREDNISONE 10 MG (21) PO TBPK: ORAL_TABLET | ORAL | 0 refills | Status: DC

## 2020-03-22 MED ORDER — LIDOCAINE 5 % EX PTCH: 1.0000 | MEDICATED_PATCH | CUTANEOUS | 0 refills | Status: DC

## 2020-03-22 MED ORDER — IBUPROFEN 600 MG PO TABS: 600.0000 mg | ORAL_TABLET | Freq: Four times a day (QID) | ORAL | 0 refills | Status: DC | PRN

## 2020-03-22 MED ORDER — METHOCARBAMOL 750 MG PO TABS: 750.0000 mg | ORAL_TABLET | Freq: Two times a day (BID) | ORAL | 0 refills | Status: DC | PRN

## 2020-03-22 NOTE — ED Provider Notes (Signed)
[facility] EMERGENCY DEPARTMENT Provider Note   CSN: [id number] Arrival date & time: 03/22/20  0056     History Chief Complaint  Patient presents with  . Back Pain    Keith Henry is a 45 y.o. male.  HPI      Keith Henry is a 45 y.o. male, with a history of asthma and back pain, presenting to the ED with left-sided back pain, acute on chronic occurrence beginning yesterday morning.  Pain is aching radiating into the left buttocks.  Denies history of HIV, IVDA.  Denies falls/trauma, numbness, weakness, urinary symptoms, abdominal pain, changes in bowel/bladder function, saddle anesthesias, N/V, or any other complaints.   Past Medical History:  Diagnosis Date  . Asthma     There are no problems to display for this patient.   Past Surgical History:  Procedure Laterality Date  . ANKLE SURGERY    . SKIN GRAFT    . SKULL FRACTURE ELEVATION     pt unsure of specifics       History reviewed. No pertinent family history.  Social History   Tobacco Use  . Smoking status: Current Every Day Smoker    Packs/day: 2.00    Types: Cigarettes  . Smokeless tobacco: Never Used  Vaping Use  . Vaping Use: Never used  Substance Use Topics  . Alcohol use: Yes    Alcohol/week: 2.0 standard drinks    Types: 2 Standard drinks or equivalent per week    Comment: occasion  . Drug use: Yes    Types: Marijuana    Home Medications Prior to Admission medications   Medication Sig Start Date End Date Taking? Authorizing Provider  ibuprofen (ADVIL) 600 MG tablet Take 1 tablet (600 mg total) by mouth every 6 (six) hours as needed. 03/22/20  Yes Myliyah Rebuck C, PA-C  lidocaine (LIDODERM) 5 % Place 1 patch onto the skin daily. Remove & Discard patch within 12 hours or as directed by MD 03/22/20  Yes Vida Nicol C, PA-C  methocarbamol (ROBAXIN) 750 MG tablet Take 1 tablet (750 mg total) by mouth 2 (two) times daily as needed for muscle spasms (or muscle tightness).  03/22/20  Yes Artem Bunte C, PA-C  predniSONE (STERAPRED UNI-PAK 21 TAB) 10 MG (21) TBPK tablet Take 6 tabs (60mg ) day 1, 5 tabs (50mg ) day 2, 4 tabs (40mg ) day 3, 3 tabs (30mg ) day 4, 2 tabs (20mg ) day 5, and 1 tab (10mg ) day 6. 03/22/20  Yes Dashaun Onstott C, PA-C  albuterol (VENTOLIN HFA) 108 (90 Base) MCG/ACT inhaler Inhale 1-2 puffs into the lungs every 6 (six) hours as needed for wheezing or shortness of breath. 01/27/20   , MD    Allergies    Other and Methylprednisolone sodium succ  Review of Systems   Review of Systems  Constitutional: Negative for fever.  Respiratory: Negative for shortness of breath.   Cardiovascular: Negative for chest pain.  Gastrointestinal: Negative for abdominal pain.  Genitourinary: Negative for difficulty urinating.  Musculoskeletal: Positive for back pain.  Neurological: Negative for weakness and numbness.  All other systems reviewed and are negative.   Physical Exam Updated Vital Signs BP 107/69   Pulse 64   Temp 98.7 F (37.1 C) (Oral)   Resp 16   Ht 5' 9.75" (1.772 m)   Wt 87.1 kg   SpO2 97%   BMI 27.75 kg/m   Physical Exam Vitals and nursing note reviewed.  Constitutional:  General: He is not in acute distress.    Appearance: He is well-developed. He is not diaphoretic.  HENT:     Head: Normocephalic and atraumatic.     Mouth/Throat:     Mouth: Mucous membranes are moist.     Pharynx: Oropharynx is clear.  Eyes:     Conjunctiva/sclera: Conjunctivae normal.  Cardiovascular:     Rate and Rhythm: Normal rate and regular rhythm.     Pulses: Normal pulses.          Radial pulses are 2+ on the right side and 2+ on the left side.       Posterior tibial pulses are 2+ on the right side and 2+ on the left side.     Comments: Tactile temperature in the extremities appropriate and equal bilaterally. Pulmonary:     Effort: Pulmonary effort is normal. No respiratory distress.  Abdominal:     Palpations: Abdomen is soft.      Tenderness: There is no abdominal tenderness. There is no guarding.  Musculoskeletal:     Cervical back: Neck supple.  Skin:    General: Skin is warm and dry.  Neurological:     Mental Status: He is alert.     Comments: Sensation grossly intact to light touch in the lower extremities bilaterally. No saddle anesthesias. Strength 5/5 in the bilateral lower extremities. No noted gait deficit. Coordination intact.  Psychiatric:        Mood and Affect: Mood and affect normal.        Speech: Speech normal.        Behavior: Behavior normal.     ED Results / Procedures / Treatments   Labs (all labs ordered are listed, but only abnormal results are displayed) Labs Reviewed - No data to display  EKG None  Radiology No results found.  Procedures Procedures   Medications Ordered in ED Medications - No data to display  ED Course  I have reviewed the triage vital signs and the nursing notes.  Pertinent labs & imaging results that were available during my care of the patient were reviewed by me and considered in my medical decision making (see chart for details).    MDM Rules/Calculators/A&P                          Patient presents with acute on chronic recurrence of low back pain with aspect suggesting sciatica.  No focal neurologic deficit.  No findings to suggest neurosurgical emergency. The patient was given instructions for home care as well as return precautions. Patient voices understanding of these instructions, accepts the plan, and is comfortable with discharge.   Final Clinical Impression(s) / ED Diagnoses Final diagnoses:  Chronic left-sided low back pain with left-sided sciatica    Rx / DC Orders ED Discharge Orders         Ordered    predniSONE (STERAPRED UNI-PAK 21 TAB) 10 MG (21) TBPK tablet        03/22/20 1025    methocarbamol (ROBAXIN) 750 MG tablet  2 times daily PRN        03/22/20 1025    ibuprofen (ADVIL) 600 MG tablet  Every 6 hours PRN         03/22/20 1025    lidocaine (LIDODERM) 5 %  Every 24 hours        03/22/20 1025           Klani Caridi, Ines Bloomer C, PA-C 03/22/20 1203  Linwood Dibbles, MD 03/23/20 [phone]

## 2020-03-22 NOTE — ED Notes (Signed)
Pt called x3 for VS assessment. No answer

## 2020-03-22 NOTE — ED Triage Notes (Signed)
Patient reports lower back pain, has been going on for some time but worse tonight, states he was having trouble getting out of bed.

## 2020-03-22 NOTE — ED Notes (Signed)
No answer name called 3 times

## 2020-03-22 NOTE — Medical Student Note (Signed)
[facility]-EMERGENCY DEPT Provider Student Note For educational purposes for Medical, PA and NP students only and not part of the legal medical record.   CSN: [id number] Arrival date & time: 03/22/20  0056      History   Chief Complaint Chief Complaint  Patient presents with  . Back Pain    HPI Keith Henry is a 45 y.o. male with PMH of asthma who presents to ED for back pain.  States he woke up yesterday morning and felt pain when trying to move out of bed. He describes it as intermittent pain that causes him to have tingling in his right leg. States he took 4 tylenol and came to the ED last night. Denies trauma or falls. Denies bowel or bladder incontinence. Denies saddle anesthesia. Denies IVDU. He states this is the 4th time he has had acute back pain.   On chart review he has been seen for lumbar strain before. He was prescribed Flexeril and Norco for pain. He states that he does not have prescriptions for either of these and has not taken anything other than tylenol for th pain. He has not seen a neurologist or orthopedist.   Past Medical History:  Diagnosis Date  . Asthma     There are no problems to display for this patient.   Past Surgical History:  Procedure Laterality Date  . ANKLE SURGERY    . SKIN GRAFT    . SKULL FRACTURE ELEVATION     pt unsure of specifics     Home Medications    Prior to Admission medications   Medication Sig Start Date End Date Taking? Authorizing Provider  albuterol (VENTOLIN HFA) 108 (90 Base) MCG/ACT inhaler Inhale 1-2 puffs into the lungs every 6 (six) hours as needed for wheezing or shortness of breath. 01/27/20   Corliss Blacker, MD  cyclobenzaprine (FLEXERIL) 10 MG tablet Take 1 tablet (10 mg total) by mouth 3 (three) times daily as needed for muscle spasms. 03/16/20   Geoffery Lyons, MD  HYDROcodone-acetaminophen (NORCO) 5-325 MG tablet Take 1 tablet by mouth every 4 (four) hours as needed for severe pain. 09/14/16   Loren Racer,  MD  naproxen (NAPROSYN) 500 MG tablet Take 1 tablet (500 mg total) by mouth 2 (two) times daily with a meal. 09/26/19   Caccavale, Sophia, PA-C  predniSONE (DELTASONE) 10 MG tablet Take 2 tablets (20 mg total) by mouth 2 (two) times daily with a meal. 03/16/20   Geoffery Lyons, MD    Family History History reviewed. No pertinent family history.  Social History Social History   Tobacco Use  . Smoking status: Current Every Day Smoker    Packs/day: 2.00    Types: Cigarettes  . Smokeless tobacco: Never Used  Vaping Use  . Vaping Use: Never used  Substance Use Topics  . Alcohol use: Yes    Alcohol/week: 2.0 standard drinks    Types: 2 Standard drinks or equivalent per week    Comment: occasion  . Drug use: Yes    Types: Marijuana     Allergies   Other and Methylprednisolone sodium succ   Review of Systems Review of Systems  Constitutional: Negative for fever.  HENT: Negative.   Eyes: Negative.   Respiratory: Negative.   Cardiovascular: Negative.   Gastrointestinal: Negative.   Endocrine: Negative.   Genitourinary: Negative.  Negative for difficulty urinating.  Musculoskeletal: Positive for back pain. Negative for gait problem and neck pain.  Allergic/Immunologic: Negative.   Neurological: Positive for  numbness. Negative for dizziness, weakness and headaches.  Hematological: Negative.   Psychiatric/Behavioral: Negative.      Physical Exam Updated Vital Signs BP 107/69   Pulse 64   Temp 98.7 F (37.1 C) (Oral)   Resp 16   Ht 5' 9.75" (1.772 m)   Wt 87.1 kg   SpO2 97%   BMI 27.75 kg/m   Physical Exam Constitutional:      General: He is not in acute distress. HENT:     Head: Normocephalic and atraumatic.  Eyes:     Extraocular Movements: Extraocular movements intact.     Pupils: Pupils are equal, round, and reactive to light.  Cardiovascular:     Pulses: Normal pulses.  Musculoskeletal:        General: Tenderness present. No swelling. Normal range of  motion.     Cervical back: Normal range of motion and neck supple.     Comments: Lumbar spine  Skin:    General: Skin is warm and dry.     Capillary Refill: Capillary refill takes less than 2 seconds.  Neurological:     General: No focal deficit present.     Mental Status: He is alert.     Sensory: No sensory deficit.     Motor: No weakness.     Gait: Gait normal.     Deep Tendon Reflexes: Reflexes normal.    ED Treatments / Results  Labs (all labs ordered are listed, but only abnormal results are displayed) Labs Reviewed - No data to display  EKG  Radiology No results found.  Procedures Procedures (including critical care time)  Medications Ordered in ED Medications - No data to display   Initial Impression / Assessment and Plan / ED Course  I have reviewed the triage vital signs and the nursing notes.  Pertinent labs & imaging results that were available during my care of the patient were reviewed by me and considered in my medical decision making (see chart for details).  Keith Henry is a 51yoM with PMH and HPI as described above. Differential is chronic low back pain with sciatica, cauda equina, epidural abscess.  Low suspicion for cauda equina. No saddle anesthesia, bowel or bladder incontinence. No trauma. Gait normal. Low suspicion for epidural abscess. No IVDU, fevers, HIV negative. No erythema noted on exam.   Likely chronic low back pain with flair. 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 Do not feel re-imaging is needed as no new trauma or symptoms noted. Will prescribe Flexeril, lidocaine patches and prednisone taper. Will give referral to neurology for outpatient follow-up.   Final Clinical Impressions(s) / ED Diagnoses   Final diagnoses:  Chronic left-sided low back pain with left-sided sciatica    New Prescriptions New Prescriptions   IBUPROFEN (ADVIL) 600 MG TABLET    Take 1  tablet (600 mg total) by mouth every 6 (six) hours as needed.   LIDOCAINE (LIDODERM) 5 %    Place 1 patch onto the skin daily. Remove & Discard patch within 12 hours or as directed by MD   METHOCARBAMOL (ROBAXIN) 750 MG TABLET    Take 1 tablet (750 mg total) by mouth 2 (two) times daily as needed for muscle spasms (or muscle tightness).   PREDNISONE (STERAPRED UNI-PAK 21 TAB) 10 MG (21) TBPK TABLET    Take 6 tabs (60mg ) day 1, 5 tabs (50mg ) day 2, 4 tabs (40mg ) day 3, 3 tabs (30mg ) day 4, 2  tabs (20mg ) day 5, and 1 tab (10mg ) day 6.

## 2020-03-22 NOTE — Discharge Instructions (Signed)
  Expect your soreness to increase over the next 2-3 days. Take it easy, but do not lay around too much as this may make any stiffness worse.  Antiinflammatory medications: Take 600 mg of ibuprofen every 6 hours or 440 mg (over the counter dose) to 500 mg (prescription dose) of naproxen every 12 hours for the next 3 days. After this time, these medications may be used as needed for pain. Take these medications with food to avoid upset stomach. Choose only one of these medications, do not take them together. Acetaminophen (generic for Tylenol): Should you continue to have additional pain while taking the ibuprofen or naproxen, you may add in acetaminophen as needed. Your daily total maximum amount of acetaminophen from all sources should be limited to 4000mg/day for persons without liver problems, or 2000mg/day for those with liver problems. Methocarbamol: Methocarbamol (generic for Robaxin) is a muscle relaxer and can help relieve stiff muscles or muscle spasms.  Do not drive or perform other dangerous activities while taking this medication as it can cause drowsiness as well as changes in reaction time and judgement. Lidocaine patches: These are available via either prescription or over-the-counter. The over-the-counter option may be more economical one and are likely just as effective. There are multiple over-the-counter brands, such as Salonpas. Ice: May apply ice to the area over the next 24 hours for 15 minutes at a time to reduce pain, inflammation, and swelling, if present. Exercises: Be sure to perform the attached exercises starting with three times a week and working up to performing them daily. This is an essential part of preventing long term problems.  Follow up: Follow up with a primary care provider for any future management of these complaints. Be sure to follow up within 7-10 days. Return: Return to the ED should symptoms worsen.  For prescription assistance, may try using prescription  discount sites or apps, such as goodrx.com 

## 2020-06-03 ENCOUNTER — Emergency Department (HOSPITAL_COMMUNITY): Payer: Self-pay

## 2020-06-03 ENCOUNTER — Encounter (HOSPITAL_COMMUNITY): Payer: Self-pay

## 2020-06-03 ENCOUNTER — Other Ambulatory Visit: Payer: Self-pay

## 2020-06-03 ENCOUNTER — Emergency Department (HOSPITAL_COMMUNITY)
Admission: EM | Admit: 2020-06-03 | Discharge: 2020-06-03 | Disposition: A | Discharge status: Home / Self Care | Payer: Self-pay | Attending: Emergency Medicine | Admitting: Emergency Medicine

## 2020-06-03 DIAGNOSIS — F17210 Nicotine dependence, cigarettes, uncomplicated: Secondary | ICD-10-CM | POA: Insufficient documentation

## 2020-06-03 DIAGNOSIS — J4541 Moderate persistent asthma with (acute) exacerbation: Secondary | ICD-10-CM | POA: Insufficient documentation

## 2020-06-03 DIAGNOSIS — Z20822 Contact with and (suspected) exposure to covid-19: Secondary | ICD-10-CM | POA: Insufficient documentation

## 2020-06-03 DIAGNOSIS — J45901 Unspecified asthma with (acute) exacerbation: Secondary | ICD-10-CM

## 2020-06-03 LAB — CBC WITH DIFFERENTIAL/PLATELET
Abs Immature Granulocytes: 0.03 10*3/uL (ref 0.00–0.07)
Basophils Absolute: 0.1 10*3/uL (ref 0.0–0.1)
Basophils Relative: 1 %
Eosinophils Absolute: 0.7 10*3/uL — ABNORMAL HIGH (ref 0.0–0.5)
Eosinophils Relative: 7 %
HCT: 39 % (ref 39.0–52.0)
Hemoglobin: 13.1 g/dL (ref 13.0–17.0)
Immature Granulocytes: 0 %
Lymphocytes Relative: 22 %
Lymphs Abs: 2.3 10*3/uL (ref 0.7–4.0)
MCH: 33.1 pg (ref 26.0–34.0)
MCHC: 33.6 g/dL (ref 30.0–36.0)
MCV: 98.5 fL (ref 80.0–100.0)
Monocytes Absolute: 0.7 10*3/uL (ref 0.1–1.0)
Monocytes Relative: 7 %
Neutro Abs: 6.7 10*3/uL (ref 1.7–7.7)
Neutrophils Relative %: 63 %
Platelets: 326 10*3/uL (ref 150–400)
RBC: 3.96 MIL/uL — ABNORMAL LOW (ref 4.22–5.81)
RDW: 12 % (ref 11.5–15.5)
WBC: 10.6 10*3/uL — ABNORMAL HIGH (ref 4.0–10.5)
nRBC: 0 % (ref 0.0–0.2)

## 2020-06-03 LAB — RESP PANEL BY RT-PCR (FLU A&B, COVID) ARPGX2
Influenza A by PCR: NEGATIVE
Influenza B by PCR: NEGATIVE
SARS Coronavirus 2 by RT PCR: NEGATIVE

## 2020-06-03 LAB — BASIC METABOLIC PANEL
Anion gap: 8 (ref 5–15)
BUN: 10 mg/dL (ref 6–20)
CO2: 25 mmol/L (ref 22–32)
Calcium: 8.4 mg/dL — ABNORMAL LOW (ref 8.9–10.3)
Chloride: 106 mmol/L (ref 98–111)
Creatinine, Ser: 1.1 mg/dL (ref 0.61–1.24)
GFR, Estimated: >60 mL/min (ref >60)
Glucose, Bld: 87 mg/dL (ref 70–99)
Potassium: 3.8 mmol/L (ref 3.5–5.1)
Sodium: 139 mmol/L (ref 135–145)

## 2020-06-03 MED ORDER — ALBUTEROL SULFATE HFA 108 (90 BASE) MCG/ACT IN AERS: 2.0000 | INHALATION_SPRAY | RESPIRATORY_TRACT | Status: DC | PRN

## 2020-06-03 MED ORDER — PREDNISONE 20 MG PO TABS: 40.0000 mg | ORAL_TABLET | Freq: Every day | ORAL | 0 refills | Status: DC

## 2020-06-03 MED ORDER — DEXAMETHASONE SODIUM PHOSPHATE 10 MG/ML IJ SOLN
10.0000 mg | Freq: Once | INTRAMUSCULAR | Status: AC
  Administered 2020-06-03: 10 mg via INTRAVENOUS
  Filled 2020-06-03: qty 1

## 2020-06-03 MED ORDER — ALBUTEROL SULFATE HFA 108 (90 BASE) MCG/ACT IN AERS
8.0000 | INHALATION_SPRAY | RESPIRATORY_TRACT | Status: AC
  Administered 2020-06-03: 8 via RESPIRATORY_TRACT
  Filled 2020-06-03: qty 6.7

## 2020-06-03 MED ORDER — IPRATROPIUM BROMIDE 0.02 % IN SOLN
0.5000 mg | Freq: Once | RESPIRATORY_TRACT | Status: AC
  Administered 2020-06-03: 0.5 mg via RESPIRATORY_TRACT
  Filled 2020-06-03: qty 2.5

## 2020-06-03 MED ORDER — MAGNESIUM SULFATE 2 GM/50ML IV SOLN
2.0000 g | Freq: Once | INTRAVENOUS | Status: AC
  Administered 2020-06-03: 2 g via INTRAVENOUS
  Filled 2020-06-03: qty 50

## 2020-06-03 MED ORDER — ALBUTEROL (5 MG/ML) CONTINUOUS INHALATION SOLN
10.0000 mg/h | INHALATION_SOLUTION | Freq: Once | RESPIRATORY_TRACT | Status: AC
  Administered 2020-06-03: 10 mg/h via RESPIRATORY_TRACT
  Filled 2020-06-03: qty 20

## 2020-06-03 NOTE — ED Notes (Signed)
ED PA to bedside to reassess pt prior to discharge due to oxygen saturations of 90-92% while on room air. Pt in NAD. PA agreeable to plan of discharge. Pt provided discharge instruction. Opportunity for questions provided.

## 2020-06-03 NOTE — ED Provider Notes (Signed)
Asked by RN to reassess patient prior to discharge.  Patient states he is feeling much better.  Shortness of breath has improved, he continues to have mild residual chest tightness.  He states this feels consistent with his asthma.  He states he feels well enough to go home with with albuterol and steroids, and does not want to stay in the hospital for further tx.  I discussed importance of prompt return to the ER with any worsening symptoms.  Sats in the low 90s on room air after continuous neb treatment.  Exam shows no wheezing.  Discussed findings with patient and once again encourage close monitoring and prompt return with worsening symptoms.  At this time, patient appears safe for discharge.   Alveria Apley, PA-C 06/03/20 6945    Wynetta Fines, MD 06/04/20 [phone]

## 2020-06-03 NOTE — ED Provider Notes (Signed)
[facility] EMERGENCY DEPARTMENT Provider Note   CSN: [id number] Arrival date & time: 06/03/20  0411     History Chief Complaint  Patient presents with  . Shortness of Breath    Keith Henry is a 45 y.o. male.  Patient presents to the emergency department for evaluation of shortness of breath.  Patient reports that he has been experiencing shortness of breath for 14 days.  He has a history of asthma and does not have albuterol.  Tonight his breathing worsened.  No fever or productive cough.  No chest pain.        Past Medical History:  Diagnosis Date  . Asthma     There are no problems to display for this patient.   Past Surgical History:  Procedure Laterality Date  . ANKLE SURGERY    . SKIN GRAFT    . SKULL FRACTURE ELEVATION     pt unsure of specifics       History reviewed. No pertinent family history.  Social History   Tobacco Use  . Smoking status: Current Every Day Smoker    Packs/day: 2.00    Types: Cigarettes  . Smokeless tobacco: Never Used  Vaping Use  . Vaping Use: Never used  Substance Use Topics  . Alcohol use: Yes    Alcohol/week: 2.0 standard drinks    Types: 2 Standard drinks or equivalent per week    Comment: occasion  . Drug use: Yes    Types: Marijuana    Home Medications Prior to Admission medications   Medication Sig Start Date End Date Taking? Authorizing Provider  predniSONE (DELTASONE) 20 MG tablet Take 2 tablets (40 mg total) by mouth daily with breakfast. 06/03/20  Yes Koleman Marling, Canary Brim, MD  albuterol (VENTOLIN HFA) 108 (90 Base) MCG/ACT inhaler Inhale 1-2 puffs into the lungs every 6 (six) hours as needed for wheezing or shortness of breath. 01/27/20   Corliss Blacker, MD  ibuprofen (ADVIL) 600 MG tablet Take 1 tablet (600 mg total) by mouth every 6 (six) hours as needed. 03/22/20   Joy, Shawn C, PA-C  lidocaine (LIDODERM) 5 % Place 1 patch onto the skin daily. Remove & Discard patch within 12 hours or  as directed by MD 03/22/20   Joy, Hillard Danker, PA-C  methocarbamol (ROBAXIN) 750 MG tablet Take 1 tablet (750 mg total) by mouth 2 (two) times daily as needed for muscle spasms (or muscle tightness). 03/22/20   Joy, Shawn C, PA-C    Allergies    Other and Methylprednisolone sodium succ  Review of Systems   Review of Systems  Respiratory: Positive for shortness of breath and wheezing.   All other systems reviewed and are negative.   Physical Exam Updated Vital Signs BP 127/76   Pulse 78   Temp 98.1 F (36.7 C) (Oral)   Resp 17   Ht 5\' 10"  (1.778 m)   Wt 87.1 kg   SpO2 94%   BMI 27.55 kg/m   Physical Exam Vitals and nursing note reviewed.  Constitutional:      General: He is not in acute distress.    Appearance: Normal appearance. He is well-developed.  HENT:     Head: Normocephalic and atraumatic.     Right Ear: Hearing normal.     Left Ear: Hearing normal.     Nose: Nose normal.  Eyes:     Conjunctiva/sclera: Conjunctivae normal.     Pupils: Pupils are equal, round, and reactive to light.  Cardiovascular:  Rate and Rhythm: Regular rhythm.     Heart sounds: S1 normal and S2 normal. No murmur heard. No friction rub. No gallop.   Pulmonary:     Effort: Pulmonary effort is normal. Tachypnea and prolonged expiration present. No respiratory distress.     Breath sounds: Decreased breath sounds and wheezing present.  Chest:     Chest wall: No tenderness.  Abdominal:     General: Bowel sounds are normal.     Palpations: Abdomen is soft.     Tenderness: There is no abdominal tenderness. There is no guarding or rebound. Negative signs include Murphy's sign and McBurney's sign.     Hernia: No hernia is present.  Musculoskeletal:        General: Normal range of motion.     Cervical back: Normal range of motion and neck supple.  Skin:    General: Skin is warm and dry.     Findings: No rash.  Neurological:     Mental Status: He is alert and oriented to person, place, and  time.     GCS: GCS eye subscore is 4. GCS verbal subscore is 5. GCS motor subscore is 6.     Cranial Nerves: No cranial nerve deficit.     Sensory: No sensory deficit.     Coordination: Coordination normal.  Psychiatric:        Speech: Speech normal.        Behavior: Behavior normal.        Thought Content: Thought content normal.     ED Results / Procedures / Treatments   Labs (all labs ordered are listed, but only abnormal results are displayed) Labs Reviewed  CBC WITH DIFFERENTIAL/PLATELET - Abnormal; Notable for the following components:      Result Value   WBC 10.6 (*)    RBC 3.96 (*)    Eosinophils Absolute 0.7 (*)    All other components within normal limits  BASIC METABOLIC PANEL - Abnormal; Notable for the following components:   Calcium 8.4 (*)    All other components within normal limits  RESP PANEL BY RT-PCR (FLU A&B, COVID) ARPGX2    EKG None  Radiology DG Chest Port 1 View  Result Date: 06/03/2020 CLINICAL DATA:  45 year old male with shortness of breath onset tonight. Ran out of asthma medications. EXAM: PORTABLE CHEST 1 VIEW COMPARISON:  Portable chest 05/02/2020 and earlier. FINDINGS: Portable AP upright view at 0439 hours. Improved lung volumes. Normal cardiac size and mediastinal contours. Visualized tracheal air column is within normal limits. Allowing for portable technique the lungs are clear. No pneumothorax or pleural effusion. Stable visualized osseous structures. Chronic right clavicle fracture. Negative visible bowel gas. IMPRESSION: Negative portable chest; chronic right clavicle fracture. Electronically Signed   By: Odessa Fleming M.D.   On: 06/03/2020 05:03    Procedures Procedures   Medications Ordered in ED Medications  albuterol (VENTOLIN HFA) 108 (90 Base) MCG/ACT inhaler 2 puff (has no administration in time range)  albuterol (VENTOLIN HFA) 108 (90 Base) MCG/ACT inhaler 8 puff (8 puffs Inhalation Given 06/03/20 0459)  magnesium sulfate IVPB 2 g 50  mL (0 g Intravenous Stopped 06/03/20 0558)  dexamethasone (DECADRON) injection 10 mg (10 mg Intravenous Given 06/03/20 0456)  albuterol (PROVENTIL,VENTOLIN) solution continuous neb (10 mg/hr Nebulization Given 06/03/20 0628)  ipratropium (ATROVENT) nebulizer solution 0.5 mg (0.5 mg Nebulization Given 06/03/20 0606)    ED Course  I have reviewed the triage vital signs and the nursing notes.  Pertinent labs &  imaging results that were available during my care of the patient were reviewed by me and considered in my medical decision making (see chart for details).    MDM Rules/Calculators/A&P                          Patient presents to the emergency department for evaluation of asthma exacerbation.  Patient reports a previous history of asthma and does not currently have an inhaler.  He has been having progressively worsening shortness of breath for more than a week.  Symptoms worsened tonight, had decreased air movement and significant wheezing upon arrival.  COVID and flu testing negative.  X-ray without evidence of pneumonia.  Patient treated with magnesium, Decadron, bronchodilator therapy.  Recheck reveals no further wheezing.  Will discharge and continue bronchodilator and corticosteroid therapy as an outpatient.  Final Clinical Impression(s) / ED Diagnoses Final diagnoses:  Moderate asthma with acute exacerbation, unspecified whether persistent    Rx / DC Orders ED Discharge Orders         Ordered    predniSONE (DELTASONE) 20 MG tablet  Daily with breakfast        06/03/20 0705           Gilda Crease, MD 06/03/20 [phone]

## 2020-06-03 NOTE — ED Triage Notes (Signed)
Patient reports shortness of breath starting tonight, states hx of asthma and has been out of his medications.

## 2020-06-13 ENCOUNTER — Encounter (HOSPITAL_COMMUNITY): Payer: Self-pay | Admitting: Emergency Medicine

## 2020-06-13 ENCOUNTER — Other Ambulatory Visit: Payer: Self-pay

## 2020-06-13 ENCOUNTER — Emergency Department (HOSPITAL_COMMUNITY): Payer: Self-pay

## 2020-06-13 ENCOUNTER — Emergency Department (HOSPITAL_COMMUNITY)
Admission: EM | Admit: 2020-06-13 | Discharge: 2020-06-13 | Disposition: A | Discharge status: Home / Self Care | Payer: Self-pay | Attending: Emergency Medicine | Admitting: Emergency Medicine

## 2020-06-13 DIAGNOSIS — R059 Cough, unspecified: Secondary | ICD-10-CM | POA: Insufficient documentation

## 2020-06-13 DIAGNOSIS — R0602 Shortness of breath: Secondary | ICD-10-CM | POA: Insufficient documentation

## 2020-06-13 DIAGNOSIS — Z5321 Procedure and treatment not carried out due to patient leaving prior to being seen by health care provider: Secondary | ICD-10-CM | POA: Insufficient documentation

## 2020-06-13 DIAGNOSIS — R062 Wheezing: Secondary | ICD-10-CM | POA: Insufficient documentation

## 2020-06-13 NOTE — ED Triage Notes (Signed)
Pt c/o shortness of breath, cough, and wheezing. Hx asthma, states home meds aren't working.

## 2020-06-13 NOTE — ED Notes (Signed)
Pt stated he was not waiting and to take his name out . Pt left.

## 2020-06-13 NOTE — ED Provider Notes (Signed)
Emergency Medicine Provider Triage Evaluation Note  Keith Henry 45 y.o. male was evaluated in triage.  Pt complains of asthma attack.  He states for the last several days, he has been wheezing.  He has been using his breathing treatments and albuterol inhalers with some improvement but states it keeps returning.  He has also been having some cough.  No fevers, abdominal pain.  He states he has been around people who have been smoking which exacerbates his asthma   Review of Systems  Positive: Cough, wheezing Negative: Fevers, abdominal pain.  Physical Exam  BP 134/82   Pulse 70   Temp 98.2 F (36.8 C) (Oral)   Resp 18   Ht 5\' 4"  (1.626 m)   Wt 65.8 kg   SpO2 100%   BMI 24.89 kg/m  Gen:   Awake, no distress   HEENT:  Atraumatic  Resp:  Normal effort.  Diffuse expiratory wheezing noted throughout all lung fields.  Able to speak full sentences without any difficulty.  No evidence of respiratory distress. Cardiac:  Normal rate  Abd:   Nondistended, nontender  MSK:   Moves extremities without difficulty  Neuro:  Speech clear   Other:   N/AA  Medical Decision Making  Medically screening exam initiated at 5:43 PM  Appropriate orders placed.  was informed that the remainder of the evaluation will be completed by another provider, this initial triage assessment does not replace that evaluation, and the importance of remaining in the ED until their evaluation is complete.   Clinical Impression  Wheezing, cough   Portions of this note were generated with Dragon dictation software. Dictation errors may occur despite best attempts at proofreading.     [name], PA-C 06/13/20 1743    08/13/20, MD 06/13/20 2342

## 2020-06-13 NOTE — ED Notes (Signed)
Pt stepped outside to get something from his car. Pt understands that if he misses name being call we will go to the next person.

## 2020-06-13 NOTE — ED Notes (Signed)
Pt walks up to this NT and ask "how long is the wait time?" I explained that the wait time varies and is not set to a particular time. I can tell how long the longest person has waited and how long the patient has waited and how many higher acuities are still waiting. Patient states "no you cant help me with shit then."

## 2020-06-14 ENCOUNTER — Encounter (HOSPITAL_COMMUNITY): Payer: Self-pay

## 2020-06-14 ENCOUNTER — Emergency Department (HOSPITAL_COMMUNITY)
Admission: EM | Admit: 2020-06-14 | Discharge: 2020-06-14 | Disposition: A | Discharge status: Home / Self Care | Payer: Self-pay | Attending: Emergency Medicine | Admitting: Emergency Medicine

## 2020-06-14 DIAGNOSIS — F17210 Nicotine dependence, cigarettes, uncomplicated: Secondary | ICD-10-CM | POA: Insufficient documentation

## 2020-06-14 DIAGNOSIS — J45901 Unspecified asthma with (acute) exacerbation: Secondary | ICD-10-CM | POA: Insufficient documentation

## 2020-06-14 DIAGNOSIS — J4541 Moderate persistent asthma with (acute) exacerbation: Secondary | ICD-10-CM

## 2020-06-14 MED ORDER — IPRATROPIUM BROMIDE 0.02 % IN SOLN
0.5000 mg | Freq: Once | RESPIRATORY_TRACT | Status: AC
  Administered 2020-06-14: 0.5 mg via RESPIRATORY_TRACT
  Filled 2020-06-14: qty 2.5

## 2020-06-14 MED ORDER — ALBUTEROL SULFATE HFA 108 (90 BASE) MCG/ACT IN AERS: 1.0000 | INHALATION_SPRAY | Freq: Four times a day (QID) | RESPIRATORY_TRACT | 0 refills | Status: DC | PRN

## 2020-06-14 MED ORDER — ALBUTEROL SULFATE (2.5 MG/3ML) 0.083% IN NEBU
5.0000 mg | INHALATION_SOLUTION | Freq: Once | RESPIRATORY_TRACT | Status: AC
  Administered 2020-06-14: 5 mg via RESPIRATORY_TRACT
  Filled 2020-06-14: qty 6

## 2020-06-14 MED ORDER — DEXAMETHASONE 4 MG PO TABS
10.0000 mg | ORAL_TABLET | Freq: Once | ORAL | Status: AC
  Administered 2020-06-14: 10 mg via ORAL
  Filled 2020-06-14: qty 3

## 2020-06-14 MED ORDER — ALBUTEROL SULFATE (2.5 MG/3ML) 0.083% IN NEBU: 5.0000 mg | INHALATION_SOLUTION | Freq: Four times a day (QID) | RESPIRATORY_TRACT | 12 refills | Status: DC | PRN

## 2020-06-14 NOTE — ED Provider Notes (Signed)
[facility] EMERGENCY DEPARTMENT Provider Note   CSN: [id number] Arrival date & time: 06/14/20  0128     History Chief Complaint  Patient presents with  . Asthma    Keith Henry is a 45 y.o. male.  Patient to ED with complaint of uncontrolled asthma. He reports increased wheezing over the last 2 weeks. No fever, chest pain, cough or congestion. He has a nebulizer at home but does not have the medication for it. He is a nonsmoker but reports being exposed to second hand smoke recently that he feels has caused his current symptoms.   The history is provided by the patient. No language interpreter was used.  Asthma Pertinent negatives include no chest pain.       Past Medical History:  Diagnosis Date  . Asthma     There are no problems to display for this patient.   Past Surgical History:  Procedure Laterality Date  . ANKLE SURGERY    . SKIN GRAFT    . SKULL FRACTURE ELEVATION     pt unsure of specifics       No family history on file.  Social History   Tobacco Use  . Smoking status: Current Every Day Smoker    Packs/day: 2.00    Types: Cigarettes  . Smokeless tobacco: Never Used  Vaping Use  . Vaping Use: Never used  Substance Use Topics  . Alcohol use: Yes    Alcohol/week: 2.0 standard drinks    Types: 2 Standard drinks or equivalent per week    Comment: occasion  . Drug use: Yes    Types: Marijuana    Home Medications Prior to Admission medications   Medication Sig Start Date End Date Taking? Authorizing Provider  albuterol (VENTOLIN HFA) 108 (90 Base) MCG/ACT inhaler Inhale 1-2 puffs into the lungs every 6 (six) hours as needed for wheezing or shortness of breath. 01/27/20   Corliss Blacker, MD  ibuprofen (ADVIL) 600 MG tablet Take 1 tablet (600 mg total) by mouth every 6 (six) hours as needed. 03/22/20   Joy, Shawn C, PA-C  lidocaine (LIDODERM) 5 % Place 1 patch onto the skin daily. Remove & Discard patch within 12 hours or as  directed by MD 03/22/20   Joy, Hillard Danker, PA-C  methocarbamol (ROBAXIN) 750 MG tablet Take 1 tablet (750 mg total) by mouth 2 (two) times daily as needed for muscle spasms (or muscle tightness). 03/22/20   Joy, Shawn C, PA-C  predniSONE (DELTASONE) 20 MG tablet Take 2 tablets (40 mg total) by mouth daily with breakfast. 06/03/20   Pollina, Canary Brim, MD    Allergies    Other and Methylprednisolone sodium succ  Review of Systems   Review of Systems  Constitutional: Negative for chills and fever.  HENT: Negative.  Negative for congestion.   Respiratory: Positive for wheezing. Negative for cough.   Cardiovascular: Negative.  Negative for chest pain.  Gastrointestinal: Negative.  Negative for nausea.  Musculoskeletal: Negative.  Negative for myalgias.  Neurological: Negative.     Physical Exam Updated Vital Signs BP 135/89 (BP Location: Right Arm)   Pulse 82   Temp 98 F (36.7 C) (Oral)   Resp 16   SpO2 100%   Physical Exam Vitals and nursing note reviewed.  Constitutional:      Appearance: He is well-developed.  HENT:     Head: Normocephalic.  Cardiovascular:     Rate and Rhythm: Normal rate and regular rhythm.  Pulmonary:  Effort: Pulmonary effort is normal.     Breath sounds: Wheezing (Expiratory wheezing throughout) and rhonchi (Coarse breath sounds bilaterally) present.  Abdominal:     General: Bowel sounds are normal.     Palpations: Abdomen is soft.     Tenderness: There is no abdominal tenderness. There is no guarding or rebound.  Musculoskeletal:        General: Normal range of motion.     Cervical back: Normal range of motion and neck supple.  Skin:    General: Skin is warm and dry.  Neurological:     Mental Status: He is alert and oriented to person, place, and time.     ED Results / Procedures / Treatments   Labs (all labs ordered are listed, but only abnormal results are displayed) Labs Reviewed - No data to display  EKG None  Radiology DG Chest  2 View  Result Date: 06/13/2020 CLINICAL DATA:  45 year old male with cough. EXAM: CHEST - 2 VIEW COMPARISON:  Chest radiograph dated 06/03/2020. FINDINGS: The heart size and mediastinal contours are within normal limits. Both lungs are clear. The visualized skeletal structures are unremarkable. IMPRESSION: No active cardiopulmonary disease. Electronically Signed   By: Elgie Collard M.D.   On: 06/13/2020 19:50    Procedures Procedures   Medications Ordered in ED Medications  albuterol (PROVENTIL) (2.5 MG/3ML) 0.083% nebulizer solution 5 mg (has no administration in time range)  ipratropium (ATROVENT) nebulizer solution 0.5 mg (has no administration in time range)  dexamethasone (DECADRON) tablet 10 mg (has no administration in time range)    ED Course  I have reviewed the triage vital signs and the nursing notes.  Pertinent labs & imaging results that were available during my care of the patient were reviewed by me and considered in my medical decision making (see chart for details).    MDM Rules/Calculators/A&P                          Patient to ED with wheezing and SOB typical of asthma exacerbation. No fever.   He has significant wheezing on exam but in NAD. No hypoxia. Nebulizer treatment and Decadron ordered.   On re-exam after single neb treatment, there is not much improvement in wheezing or air movement. Back to back tx's x 2 ordered.   Wheezing significantly improved after 3 nebs. Patient reports he feels much better. He is felt appropriate for discharge home. Strict return precautions discussed. Will provide Rx inhaler and neb solution.   Final Clinical Impression(s) / ED Diagnoses Final diagnoses:  None   1. Asthma exacerbation  Rx / DC Orders ED Discharge Orders    None       Elpidio Anis, PA-C 06/14/20 0454    Alvira Monday, MD 06/14/20 1700

## 2020-06-14 NOTE — Discharge Instructions (Addendum)
Return to the ED with any worsening symptoms at any time.

## 2020-06-14 NOTE — ED Triage Notes (Signed)
Pt comes for asthma that has been acting up for the past 2 weeks, LWBS earlier, states home meds arent working

## 2020-06-17 ENCOUNTER — Emergency Department (HOSPITAL_COMMUNITY)
Admission: EM | Admit: 2020-06-17 | Discharge: 2020-06-17 | Disposition: A | Discharge status: Home / Self Care | Payer: Self-pay | Attending: Emergency Medicine | Admitting: Emergency Medicine

## 2020-06-17 ENCOUNTER — Other Ambulatory Visit: Payer: Self-pay

## 2020-06-17 DIAGNOSIS — N342 Other urethritis: Secondary | ICD-10-CM | POA: Insufficient documentation

## 2020-06-17 DIAGNOSIS — F17210 Nicotine dependence, cigarettes, uncomplicated: Secondary | ICD-10-CM | POA: Insufficient documentation

## 2020-06-17 DIAGNOSIS — F1210 Cannabis abuse, uncomplicated: Secondary | ICD-10-CM | POA: Insufficient documentation

## 2020-06-17 DIAGNOSIS — Z79899 Other long term (current) drug therapy: Secondary | ICD-10-CM | POA: Insufficient documentation

## 2020-06-17 LAB — HIV ANTIBODY (ROUTINE TESTING W REFLEX): HIV Screen 4th Generation wRfx: NONREACTIVE

## 2020-06-17 MED ORDER — CEFTRIAXONE SODIUM 500 MG IJ SOLR
500.0000 mg | Freq: Once | INTRAMUSCULAR | Status: AC
  Administered 2020-06-17: 500 mg via INTRAMUSCULAR
  Filled 2020-06-17 (×2): qty 500

## 2020-06-17 MED ORDER — LIDOCAINE HCL (PF) 1 % IJ SOLN
INTRAMUSCULAR | Status: AC
  Filled 2020-06-17: qty 5

## 2020-06-17 MED ORDER — STERILE WATER FOR INJECTION IJ SOLN
INTRAMUSCULAR | Status: AC
  Filled 2020-06-17: qty 10

## 2020-06-17 MED ORDER — DOXYCYCLINE HYCLATE 100 MG PO CAPS: 100.0000 mg | ORAL_CAPSULE | Freq: Two times a day (BID) | ORAL | 0 refills | Status: DC

## 2020-06-17 NOTE — ED Provider Notes (Signed)
Emergency Medicine Provider Triage Evaluation Note  Keith Henry , a 45 y.o. male  was evaluated in triage.  Pt complains of possible gonorrhe  Review of Systems  Positive: discharge Negative: fever  Physical Exam  BP 131/78 (BP Location: Left Arm)   Pulse 98   Temp 98.3 F (36.8 C) (Oral)   Resp 16   Ht 5\' 10"  (1.778 m)   Wt 85.3 kg   SpO2 99%   BMI 26.98 kg/m  Gen:   Awake, no distress   Resp:  Normal effort  MSK:   Moves extremities without difficulty  Other:    Medical Decision Making  Medically screening exam initiated at 10:01 AM.  Appropriate orders placed.  was informed that the remainder of the evaluation will be completed by another provider, this initial triage assessment does not replace that evaluation, and the importance of remaining in the ED until their evaluation is complete.  Gc/ct/hiv/rpr ordered    [name], PA-C 06/17/20 1146    06/19/20, MD 06/18/20 1450

## 2020-06-17 NOTE — ED Triage Notes (Signed)
Pt believes he has been exposed to STD. Endorses drainage from penis. No trouble urinate, denies pain

## 2020-06-17 NOTE — ED Provider Notes (Addendum)
[facility] EMERGENCY DEPARTMENT Provider Note   CSN: [id number] Arrival date & time: 06/17/20  7829     History No chief complaint on file.   Keith Henry is a 45 y.o. male.  Pt reports he thinks he has gonnorhea.  Pt has a discharge  The history is provided by the patient. No language interpreter was used.  Penile Discharge This is a new problem. The problem occurs constantly. The problem has been gradually worsening. Nothing aggravates the symptoms. Nothing relieves the symptoms. He has tried nothing for the symptoms. The treatment provided no relief.       Past Medical History:  Diagnosis Date  . Asthma     There are no problems to display for this patient.   Past Surgical History:  Procedure Laterality Date  . ANKLE SURGERY    . SKIN GRAFT    . SKULL FRACTURE ELEVATION     pt unsure of specifics       No family history on file.  Social History   Tobacco Use  . Smoking status: Current Every Day Smoker    Packs/day: 2.00    Types: Cigarettes  . Smokeless tobacco: Never Used  Vaping Use  . Vaping Use: Never used  Substance Use Topics  . Alcohol use: Yes    Alcohol/week: 2.0 standard drinks    Types: 2 Standard drinks or equivalent per week    Comment: occasion  . Drug use: Yes    Types: Marijuana    Home Medications Prior to Admission medications   Medication Sig Start Date End Date Taking? Authorizing Provider  doxycycline (VIBRAMYCIN) 100 MG capsule Take 1 capsule (100 mg total) by mouth 2 (two) times daily. 06/17/20  Yes Cheron Schaumann K, PA-C  albuterol (PROVENTIL) (2.5 MG/3ML) 0.083% nebulizer solution Take 6 mLs (5 mg total) by nebulization every 6 (six) hours as needed for wheezing or shortness of breath. 06/14/20   Elpidio Anis, PA-C  albuterol (VENTOLIN HFA) 108 (90 Base) MCG/ACT inhaler Inhale 1-2 puffs into the lungs every 6 (six) hours as needed for wheezing or shortness of breath. 06/14/20   Elpidio Anis, PA-C   ibuprofen (ADVIL) 600 MG tablet Take 1 tablet (600 mg total) by mouth every 6 (six) hours as needed. 03/22/20   Joy, Shawn C, PA-C  lidocaine (LIDODERM) 5 % Place 1 patch onto the skin daily. Remove & Discard patch within 12 hours or as directed by MD 03/22/20   Joy, Hillard Danker, PA-C  methocarbamol (ROBAXIN) 750 MG tablet Take 1 tablet (750 mg total) by mouth 2 (two) times daily as needed for muscle spasms (or muscle tightness). 03/22/20   Joy, Shawn C, PA-C  predniSONE (DELTASONE) 20 MG tablet Take 2 tablets (40 mg total) by mouth daily with breakfast. 06/03/20   Pollina, Canary Brim, MD    Allergies    Other and Methylprednisolone sodium succ  Review of Systems   Review of Systems  Genitourinary: Positive for penile discharge.  All other systems reviewed and are negative.   Physical Exam Updated Vital Signs BP 131/78 (BP Location: Left Arm)   Pulse 98   Temp 98.3 F (36.8 C) (Oral)   Resp 16   Ht 5\' 10"  (1.778 m)   Wt 85.3 kg   SpO2 99%   BMI 26.98 kg/m   Physical Exam Vitals and nursing note reviewed.  Constitutional:      Appearance: He is well-developed.  HENT:     Head: Normocephalic and  atraumatic.  Eyes:     Conjunctiva/sclera: Conjunctivae normal.  Cardiovascular:     Rate and Rhythm: Normal rate and regular rhythm.     Heart sounds: No murmur heard.   Pulmonary:     Effort: Pulmonary effort is normal. No respiratory distress.     Breath sounds: Normal breath sounds.  Abdominal:     Palpations: Abdomen is soft.     Tenderness: There is no abdominal tenderness.  Musculoskeletal:     Cervical back: Neck supple.  Skin:    General: Skin is warm and dry.  Neurological:     Mental Status: He is alert.     ED Results / Procedures / Treatments   Labs (all labs ordered are listed, but only abnormal results are displayed) Labs Reviewed  HIV ANTIBODY (ROUTINE TESTING W REFLEX)  RPR  GC/CHLAMYDIA PROBE AMP ([place]) NOT AT [facility]     EKG None  Radiology No results found.  Procedures Procedures   Medications Ordered in ED Medications  cefTRIAXone (ROCEPHIN) injection 500 mg (has no administration in time range)    ED Course  I have reviewed the triage vital signs and the nursing notes.  Pertinent labs & imaging results that were available during my care of the patient were reviewed by me and considered in my medical decision making (see chart for details).    MDM Rules/Calculators/A&P                          MDM:  Gc/ct/rpr/hiv pending.  Pt counseled on safe sex  Final Clinical Impression(s) / ED Diagnoses Final diagnoses:  Urethritis    Rx / DC Orders ED Discharge Orders         Ordered    doxycycline (VIBRAMYCIN) 100 MG capsule  2 times daily        06/17/20 1046        An After Visit Summary was printed and given to the patient.   Elson Areas, PA-C 06/17/20 1052    Osie Cheeks 06/17/20 1146    Benjiman Core, MD 06/18/20 [id number]

## 2020-06-17 NOTE — Discharge Instructions (Signed)
Return if any problems.  You test are pending

## 2020-06-18 LAB — GC/CHLAMYDIA PROBE AMP (~~LOC~~) NOT AT ARMC
Chlamydia: NEGATIVE
Comment: NEGATIVE
Comment: NORMAL
Neisseria Gonorrhea: POSITIVE — AB

## 2020-06-18 LAB — RPR: RPR Ser Ql: NONREACTIVE

## 2020-06-23 ENCOUNTER — Other Ambulatory Visit: Payer: Self-pay

## 2020-06-23 ENCOUNTER — Emergency Department (HOSPITAL_COMMUNITY): Payer: Self-pay

## 2020-06-23 ENCOUNTER — Encounter (HOSPITAL_COMMUNITY): Payer: Self-pay

## 2020-06-23 ENCOUNTER — Emergency Department (HOSPITAL_COMMUNITY)
Admission: EM | Admit: 2020-06-23 | Discharge: 2020-06-24 | Disposition: A | Discharge status: Home / Self Care | Payer: Self-pay | Attending: Emergency Medicine | Admitting: Emergency Medicine

## 2020-06-23 DIAGNOSIS — F17210 Nicotine dependence, cigarettes, uncomplicated: Secondary | ICD-10-CM | POA: Insufficient documentation

## 2020-06-23 DIAGNOSIS — J45901 Unspecified asthma with (acute) exacerbation: Secondary | ICD-10-CM | POA: Insufficient documentation

## 2020-06-23 DIAGNOSIS — M546 Pain in thoracic spine: Secondary | ICD-10-CM | POA: Insufficient documentation

## 2020-06-23 MED ORDER — AEROCHAMBER PLUS FLO-VU MISC
1.0000 | Freq: Once | Status: AC
  Administered 2020-06-24: 1
  Filled 2020-06-23: qty 1

## 2020-06-23 MED ORDER — ALBUTEROL SULFATE HFA 108 (90 BASE) MCG/ACT IN AERS
6.0000 | INHALATION_SPRAY | Freq: Once | RESPIRATORY_TRACT | Status: AC
  Administered 2020-06-24: 6 via RESPIRATORY_TRACT
  Filled 2020-06-23: qty 6.7

## 2020-06-23 MED ORDER — PREDNISONE 20 MG PO TABS
60.0000 mg | ORAL_TABLET | Freq: Once | ORAL | Status: AC
  Administered 2020-06-24: 60 mg via ORAL
  Filled 2020-06-23: qty 3

## 2020-06-23 MED ORDER — IPRATROPIUM BROMIDE HFA 17 MCG/ACT IN AERS
2.0000 | INHALATION_SPRAY | Freq: Once | RESPIRATORY_TRACT | Status: AC
  Administered 2020-06-24: 2 via RESPIRATORY_TRACT
  Filled 2020-06-23: qty 12.9

## 2020-06-23 NOTE — ED Triage Notes (Signed)
Coming in for asthma - doesn't have the money to buy the meds prescribed to him that last time he was here. Productive cough with clear sputum. Wheezing.

## 2020-06-23 NOTE — ED Provider Notes (Signed)
Emergency Medicine Provider Triage Evaluation Note  Keith Henry , a 45 y.o. male  was evaluated in triage.  Pt complains of wheezing for 14 days.  Denies any chest pain.  States that he feels short of breath denies any lightheadedness or dizziness.  He is producing some clear sputum with coughing.  Denies any fevers or chills.  He states that he was seen in 14 days ago in the ER but was not sent home with an albuterol inhaler which he is normally sent home with.  He could not afford his albuterol inhaler that he was prescribed and therefore did not receive it.  He also could not take the medication he was discharged with.  Review of Systems  Positive: Wheezing, shortness of breath Negative: Chest pain  Physical Exam  BP 111/78 (BP Location: Right Arm)   Pulse 86   Temp 98 F (36.7 C) (Oral)   Resp 19   Ht 5\' 10"  (1.778 m)   Wt 85.3 kg   SpO2 97%   BMI 26.98 kg/m  Gen:   Awake, no distress  Resp:  Prolonged expiration with wheezing.  Speaking in full sentences.  Diffuse wheezing auscultated. MSK:   Moves extremities without difficulty  Other:  No lower extremity edema or calf tenderness.  Medical Decision Making  Medically screening exam initiated at 9:56 PM.  Appropriate orders placed.  was informed that the remainder of the evaluation will be completed by another provider, this initial triage assessment does not replace that evaluation, and the importance of remaining in the ED until their evaluation is complete.  Patient is presenting with symptoms consistent with asthma exacerbation.  Likely secondary to medication noncompliance because of poor resources.  Will place orders for prednisone albuterol ipratropium and spacer.  Anticipate discharge home.  Patient understands need for waiting here in ER for full assessment recheck after treatment.   [name] [place], DOLE 06/23/20 2211    2212, MD 06/26/20 1323

## 2020-06-24 ENCOUNTER — Telehealth: Payer: Self-pay

## 2020-06-24 MED ORDER — ALBUTEROL SULFATE HFA 108 (90 BASE) MCG/ACT IN AERS: 1.0000 | INHALATION_SPRAY | Freq: Four times a day (QID) | RESPIRATORY_TRACT | 0 refills | Status: DC | PRN

## 2020-06-24 MED ORDER — PREDNISONE 20 MG PO TABS: 40.0000 mg | ORAL_TABLET | Freq: Every day | ORAL | 0 refills | Status: AC

## 2020-06-24 MED ORDER — IPRATROPIUM-ALBUTEROL 0.5-2.5 (3) MG/3ML IN SOLN
3.0000 mL | Freq: Once | RESPIRATORY_TRACT | Status: AC
  Administered 2020-06-24: 3 mL via RESPIRATORY_TRACT
  Filled 2020-06-24: qty 3

## 2020-06-24 NOTE — Discharge Instructions (Addendum)
You were evaluated in the Emergency Department and after careful evaluation, we did not find any emergent condition requiring admission or further testing in the hospital.  Your exam/testing today was overall reassuring.  Symptoms seem to be due to your asthma.  Please fill the prescriptions for the prednisone and the albuterol inhaler and take as directed.  Please return to the Emergency Department if you experience any worsening of your condition.  Thank you for allowing [place] to be a part of your care.

## 2020-06-24 NOTE — ED Notes (Signed)
Patient Alert and oriented to baseline. Stable and ambulatory to baseline. Patient verbalized understanding of the discharge instructions.  Patient belongings were taken by the patient.   

## 2020-06-24 NOTE — Telephone Encounter (Signed)
Attempted to reach Keith Henry regarding his medications and any assistance. Left message.

## 2020-06-24 NOTE — ED Provider Notes (Signed)
[facility] Emergency Department Provider Note MRN:  [id number]  Arrival date & time: 06/24/20     Chief Complaint   Asthma   History of Present Illness   Keith Henry is a 45 y.o. year-old male with a history of asthma presenting to the ED with chief complaint of asthma.  Does not have any home medications for asthma over the past 2 to 3 weeks, worsening wheezing for 2 weeks, constant, gradually worsening, denies chest pain, does endorse a tightness to the thoracic back when he is short of breath.  Denies fever or cough, no abdominal pain.  Symptoms constant, mild to moderate, no exacerbating or relieving factors.  Review of Systems  A complete 10 system review of systems was obtained and all systems are negative except as noted in the HPI and PMH.   Patient's Health History    Past Medical History:  Diagnosis Date  . Asthma     Past Surgical History:  Procedure Laterality Date  . ANKLE SURGERY    . SKIN GRAFT    . SKULL FRACTURE ELEVATION     pt unsure of specifics    History reviewed. No pertinent family history.  Social History   Socioeconomic History  . Marital status: Single    Spouse name: Not on file  . Number of children: Not on file  . Years of education: Not on file  . Highest education level: Not on file  Occupational History  . Not on file  Tobacco Use  . Smoking status: Current Every Day Smoker    Packs/day: 2.00    Types: Cigarettes  . Smokeless tobacco: Never Used  Vaping Use  . Vaping Use: Never used  Substance and Sexual Activity  . Alcohol use: Yes    Alcohol/week: 2.0 standard drinks    Types: 2 Standard drinks or equivalent per week    Comment: occasion  . Drug use: Yes    Types: Marijuana  . Sexual activity: Not on file  Other Topics Concern  . Not on file  Social History Narrative  . Not on file   Social Determinants of Health   Financial Resource Strain: Not on file  Food Insecurity: Not on file   Transportation Needs: Not on file  Physical Activity: Not on file  Stress: Not on file  Social Connections: Not on file  Intimate Partner Violence: Not on file     Physical Exam   Vitals:   06/24/20 0114 06/24/20 0553  BP: 113/71 129/86  Pulse: 75 77  Resp: 18 20  Temp:    SpO2: 98% 99%    CONSTITUTIONAL: Well-appearing, NAD NEURO:  Alert and oriented x 3, no focal deficits EYES:  eyes equal and reactive ENT/NECK:  no LAD, no JVD CARDIO: Regular rate, well-perfused, normal S1 and S2 PULM: Diffuse coarse wheezing GI/GU:  normal bowel sounds, non-distended, non-tender MSK/SPINE:  No gross deformities, no edema SKIN:  no rash, atraumatic PSYCH:  Appropriate speech and behavior  *Additional and/or pertinent findings included in MDM below  Diagnostic and Interventional Summary    EKG Interpretation  Date/Time:    Ventricular Rate:    PR Interval:    QRS Duration:   QT Interval:    QTC Calculation:   R Axis:     Text Interpretation:        Labs Reviewed - No data to display  DG Chest 2 View  Final Result      Medications  albuterol (VENTOLIN HFA)  108 (90 Base) MCG/ACT inhaler 6 puff (6 puffs Inhalation Given 06/24/20 0624)  predniSONE (DELTASONE) tablet 60 mg (60 mg Oral Given 06/24/20 0625)  aerochamber plus with mask device 1 each (1 each Other Given 06/24/20 0629)  ipratropium (ATROVENT HFA) inhaler 2 puff (2 puffs Inhalation Given 06/24/20 0623)  ipratropium-albuterol (DUONEB) 0.5-2.5 (3) MG/3ML nebulizer solution 3 mL (3 mLs Nebulization Given 06/24/20 4098)     Procedures  /  Critical Care Procedures  ED Course and Medical Decision Making  I have reviewed the triage vital signs, the nursing notes, and pertinent available records from the EMR.  Listed above are laboratory and imaging tests that I personally ordered, reviewed, and interpreted and then considered in my medical decision making (see below for details).  Suspected asthma exacerbation related  to medication noncompliance, provided steroids, nebulization, will reassess     Patient doing much better after medications listed above, appropriate for discharge.  We will consult TOC to see if we can find any financial assistance so he can afford his prescriptions.  Elmer Sow. Pilar Plate, MD [facility] Emergency Medicine [facility] [email or web address]  Final Clinical Impressions(s) / ED Diagnoses     ICD-10-CM   1. Exacerbation of asthma, unspecified asthma severity, unspecified whether persistent  J45.901     ED Discharge Orders         Ordered    albuterol (VENTOLIN HFA) 108 (90 Base) MCG/ACT inhaler  Every 6 hours PRN        06/24/20 0705    predniSONE (DELTASONE) 20 MG tablet  Daily        06/24/20 0705           Discharge Instructions Discussed with and Provided to Patient:     Discharge Instructions     You were evaluated in the Emergency Department and after careful evaluation, we did not find any emergent condition requiring admission or further testing in the hospital.  Your exam/testing today was overall reassuring.  Symptoms seem to be due to your asthma.  Please fill the prescriptions for the prednisone and the albuterol inhaler and take as directed.  Please return to the Emergency Department if you experience any worsening of your condition.  Thank you for allowing [place] to be a part of your care.        Sabas Sous, MD 06/24/20 [phone]

## 2020-06-24 NOTE — ED Notes (Signed)
Pt refused vitals 

## 2020-07-10 ENCOUNTER — Encounter (HOSPITAL_COMMUNITY): Payer: Self-pay

## 2020-07-10 ENCOUNTER — Encounter (HOSPITAL_COMMUNITY): Payer: Self-pay | Admitting: Emergency Medicine

## 2020-07-10 ENCOUNTER — Other Ambulatory Visit: Payer: Self-pay

## 2020-07-10 ENCOUNTER — Emergency Department (HOSPITAL_COMMUNITY)
Admission: EM | Admit: 2020-07-10 | Discharge: 2020-07-10 | Disposition: A | Discharge status: Home / Self Care | Payer: Self-pay | Attending: Emergency Medicine | Admitting: Emergency Medicine

## 2020-07-10 DIAGNOSIS — M79641 Pain in right hand: Secondary | ICD-10-CM | POA: Insufficient documentation

## 2020-07-10 DIAGNOSIS — M791 Myalgia, unspecified site: Secondary | ICD-10-CM | POA: Insufficient documentation

## 2020-07-10 DIAGNOSIS — M79642 Pain in left hand: Secondary | ICD-10-CM | POA: Insufficient documentation

## 2020-07-10 DIAGNOSIS — R252 Cramp and spasm: Secondary | ICD-10-CM | POA: Insufficient documentation

## 2020-07-10 DIAGNOSIS — R062 Wheezing: Secondary | ICD-10-CM | POA: Insufficient documentation

## 2020-07-10 LAB — COMPREHENSIVE METABOLIC PANEL
ALT: 9 U/L (ref 0–44)
AST: 16 U/L (ref 15–41)
Albumin: 3.4 g/dL — ABNORMAL LOW (ref 3.5–5.0)
Alkaline Phosphatase: 79 U/L (ref 38–126)
Anion gap: 7 (ref 5–15)
BUN: 12 mg/dL (ref 6–20)
CO2: 26 mmol/L (ref 22–32)
Calcium: 8.7 mg/dL — ABNORMAL LOW (ref 8.9–10.3)
Chloride: 106 mmol/L (ref 98–111)
Creatinine, Ser: 1.29 mg/dL — ABNORMAL HIGH (ref 0.61–1.24)
GFR, Estimated: >60 mL/min (ref >60)
Glucose, Bld: 97 mg/dL (ref 70–99)
Potassium: 4.6 mmol/L (ref 3.5–5.1)
Sodium: 139 mmol/L (ref 135–145)
Total Bilirubin: 0.9 mg/dL (ref 0.3–1.2)
Total Protein: 6.2 g/dL — ABNORMAL LOW (ref 6.5–8.1)

## 2020-07-10 LAB — CK: Total CK: 91 U/L (ref 49–397)

## 2020-07-10 LAB — CBC
HCT: 43.4 % (ref 39.0–52.0)
Hemoglobin: 14.1 g/dL (ref 13.0–17.0)
MCH: 32.4 pg (ref 26.0–34.0)
MCHC: 32.5 g/dL (ref 30.0–36.0)
MCV: 99.8 fL (ref 80.0–100.0)
Platelets: 361 10*3/uL (ref 150–400)
RBC: 4.35 MIL/uL (ref 4.22–5.81)
RDW: 12 % (ref 11.5–15.5)
WBC: 9.6 10*3/uL (ref 4.0–10.5)
nRBC: 0 % (ref 0.0–0.2)

## 2020-07-10 LAB — MAGNESIUM: Magnesium: 1.8 mg/dL (ref 1.7–2.4)

## 2020-07-10 MED ORDER — ALBUTEROL SULFATE HFA 108 (90 BASE) MCG/ACT IN AERS
2.0000 | INHALATION_SPRAY | Freq: Once | RESPIRATORY_TRACT | Status: AC
  Administered 2020-07-10: 2 via RESPIRATORY_TRACT
  Filled 2020-07-10: qty 6.7

## 2020-07-10 MED ORDER — PREDNISONE 20 MG PO TABS: 40.0000 mg | ORAL_TABLET | Freq: Every day | ORAL | 0 refills | Status: AC

## 2020-07-10 MED ORDER — CYCLOBENZAPRINE HCL 10 MG PO TABS: 10.0000 mg | ORAL_TABLET | Freq: Three times a day (TID) | ORAL | 0 refills | Status: AC

## 2020-07-10 MED ORDER — ALBUTEROL SULFATE HFA 108 (90 BASE) MCG/ACT IN AERS: 2.0000 | INHALATION_SPRAY | RESPIRATORY_TRACT | 0 refills | Status: DC | PRN

## 2020-07-10 NOTE — ED Provider Notes (Signed)
Emergency Medicine Provider Triage Evaluation Note  Keith Henry , a 45 y.o. male  was evaluated in triage.  Pt complains of muscle cramps.  The patient reports that he has been having muscle cramps to his entire body for the last few weeks.  No history of similar.  Denies decreased urine output, urinary frequency, new exercises, decreased p.o. intake.  No history of similar.  He has tried drinking more water and eating bananas without improvements.  No other known aggravating or alleviating factors.  Review of Systems  Positive: Muscle cramps Negative: Decreased urinary output, abdominal pain, urinary frequency  Physical Exam  BP 131/86 (BP Location: Left Arm)   Pulse 72   Temp 98.7 F (37.1 C) (Oral)   Resp 18   SpO2 100%  Gen:   Awake, no distress   Resp:  Normal effort  MSK:   Moves extremities without difficulty  Other:  Muscular compartments are soft.  Neurovascular intact to the bilateral upper and lower extremities.  Medical Decision Making  Medically screening exam initiated at 2:15 AM.  Appropriate orders placed.  Deavin Forst was informed that the remainder of the evaluation will be completed by another provider, this initial triage assessment does not replace that evaluation, and the importance of remaining in the ED until their evaluation is complete.  The patient will require further work-up and evaluation in the emergency department for his symptoms.  His work-up has been initiated.   Frederik Pear A, PA-C 07/10/20 0216    Shon Baton, MD 07/10/20 [phone]

## 2020-07-10 NOTE — ED Notes (Signed)
Pt refused vitals once again, he stated he didn't need his vitals to be checked.

## 2020-07-10 NOTE — ED Triage Notes (Signed)
Patient reports persistent generalized muscle cramps for several weeks worse today , denies injury .

## 2020-07-10 NOTE — ED Notes (Signed)
Pt refusing vital recheck. Stated his vitals do not change.

## 2020-07-10 NOTE — ED Notes (Signed)
Patient refused recheck vitalsign

## 2020-07-10 NOTE — ED Notes (Signed)
Pt refusing any care/vitals from staff. He's been very rude and irate with all staff since this tech came on shift. He just ripped his mask off and walked outside on the phone. Will monitor to see if he comes back inside. Security may have to get involved if his behavior continues to worsen.

## 2020-07-10 NOTE — Discharge Instructions (Signed)
You were provided with an albuterol inhaler on today's visit.  I have provided a prescription for Flexeril to help with your muscle pain, please take 1 tablet up to 3 times a day for the next 7 days.  Please be aware this medication can make you drowsy, do not drink alcohol or operate heavy machinery while taking this medication.

## 2020-07-10 NOTE — ED Provider Notes (Signed)
[facility] EMERGENCY DEPARTMENT Provider Note   CSN: [id number] Arrival date & time: 07/10/20  0034     History Chief Complaint  Patient presents with  . Muscle Cramps    Keith Henry is a 45 y.o. male.  45 y.o male with no PMH presents to the ED with a chief complaint of muscle spasms that have been ongoing for the past week.  Patient is currently employed as a Museum/gallery exhibitions officer, states he has had worsening cramping of bilateral hands, worsening cramping of bilateral legs.  He reports he has attempted increasing his water intake, has been eating more bananas but without much improvement in symptoms.  He does report this has been ongoing.  In addition, while waiting in the ED for the past 10 hours, he reports he is developed some wheezing, he was a prior smoker, reports he quit smoking about 3 months ago, states "I stopped cold [place] ".  He has denying any chest pain, shortness of breath, abdominal pain, other symptoms.  The history is provided by the patient.       History reviewed. No pertinent past medical history.  There are no problems to display for this patient.   History reviewed. No pertinent surgical history.     No family history on file.     Home Medications Prior to Admission medications   Medication Sig Start Date End Date Taking? Authorizing Provider  albuterol (VENTOLIN HFA) 108 (90 Base) MCG/ACT inhaler Inhale 2 puffs into the lungs every 4 (four) hours as needed for wheezing or shortness of breath. 07/10/20 08/09/20 Yes Jaziah Kwasnik, Leonie Douglas, PA-C  cyclobenzaprine (FLEXERIL) 10 MG tablet Take 1 tablet (10 mg total) by mouth 3 (three) times daily for 7 days. 07/10/20 07/17/20 Yes Melquan Ernsberger, Leonie Douglas, PA-C  predniSONE (DELTASONE) 20 MG tablet Take 2 tablets (40 mg total) by mouth daily for 5 days. 07/10/20 07/15/20 Yes Claude Manges, PA-C    Allergies    Patient has no known allergies.  Review of Systems   Review of Systems  Constitutional: Negative for chills.   Respiratory: Positive for wheezing. Negative for shortness of breath.   Cardiovascular: Negative for chest pain.  Gastrointestinal: Negative for abdominal pain and vomiting.  Genitourinary: Negative for flank pain.  Musculoskeletal: Positive for myalgias.  Neurological: Negative for light-headedness and headaches.  All other systems reviewed and are negative.   Physical Exam Updated Vital Signs BP 130/82 (BP Location: Left Arm)   Pulse 74   Temp 98.8 F (37.1 C) (Oral)   Resp 18   Ht 5\' 10"  (1.778 m)   Wt 105 kg   SpO2 99%   BMI 33.21 kg/m   Physical Exam Vitals and nursing note reviewed.  Constitutional:      Appearance: Normal appearance.  HENT:     Head: Normocephalic and atraumatic.     Mouth/Throat:     Mouth: Mucous membranes are moist.  Cardiovascular:     Rate and Rhythm: Normal rate.     Comments: No calf tenderness bilaterally. Pulmonary:     Effort: Pulmonary effort is normal.     Breath sounds: Wheezing present.     Comments: Wheezing noted throughout all lung fields.  No tachypnea. Abdominal:     General: Abdomen is flat.     Palpations: Abdomen is soft.     Tenderness: There is no abdominal tenderness.  Musculoskeletal:     Right hand: Tenderness present. No bony tenderness. Normal strength. Normal sensation. Normal capillary refill. Normal pulse.  Left hand: Tenderness present. No bony tenderness. Normal strength. Normal sensation. Normal capillary refill. Normal pulse.     Cervical back: Normal range of motion and neck supple.     Right lower leg: No edema.     Left lower leg: No edema.     Comments: Clubbing noted to bilateral hands.  Skin:    General: Skin is warm and dry.  Neurological:     Mental Status: He is alert and oriented to person, place, and time.     ED Results / Procedures / Treatments   Labs (all labs ordered are listed, but only abnormal results are displayed) Labs Reviewed  COMPREHENSIVE METABOLIC PANEL - Abnormal;  Notable for the following components:      Result Value   Creatinine, Ser 1.29 (*)    Calcium 8.7 (*)    Total Protein 6.2 (*)    Albumin 3.4 (*)    All other components within normal limits  CK  MAGNESIUM  CBC    EKG None  Radiology No results found.  Procedures Procedures   Medications Ordered in ED Medications  albuterol (VENTOLIN HFA) 108 (90 Base) MCG/ACT inhaler 2 puff (2 puffs Inhalation Given 07/10/20 1040)    ED Course  I have reviewed the triage vital signs and the nursing notes.  Pertinent labs & imaging results that were available during my care of the patient were reviewed by me and considered in my medical decision making (see chart for details).    MDM Rules/Calculators/A&P  Patient presents to the ED via POV for complaints of muscle cramping to bilateral hands along with bilateral legs, operates a forklift daily.  Does report bilateral pain to the hands, does report doing multiple gears changes while working. He has tried bananas, increasing his water intake without much relieve in symptoms.  In addition while patient waited in waiting room for about 10 hours, reports he developed new onset of wheezing.  States he recently quit smoking approximately 3 months ago, has been doing much better but does not have an inhaler at home to help with his symptoms.  During evaluation he is overall well-appearing, vitals are within normal limits.  There is wheezing noted throughout all lung fields, we did discuss putting him with an albuterol inhaler on today's visit.  No tachypnea or hypoxia on my exam.  Bilateral legs without any calf tenderness, no pitting edema.  Moves all upper and lower extremities without any deficit.  Interpretation of blood work revealed a CMP without any electrolyte derangement, creatinine level is slightly elevated at 1.29, this is consistent with his prior baseline.  LFTs are within normal limits.  CBC without leukocytosis, hemoglobin is within  normal limits.  Magnesium is normal.  CK normal, have a lower suspicion for rhabdo, he does have normal stable vital signs and is ambulatory in the ED with a steady gait.  Also discussed muscle relaxers to help with cramping that he continues to have along hands, will also discussed rice therapy, he is to follow-up with primary care physician as needed.  Patient shows no recent management, patient is stable for discharge at this time  Portions of this note were generated with Dragon dictation software. Dictation errors may occur despite best attempts at proofreading.  Final Clinical Impression(s) / ED Diagnoses Final diagnoses:  Wheezing  Muscle cramps    Rx / DC Orders ED Discharge Orders         Ordered    albuterol (VENTOLIN HFA) 108 (90  Base) MCG/ACT inhaler  Every 4 hours PRN        07/10/20 1040    predniSONE (DELTASONE) 20 MG tablet  Daily        07/10/20 1040    cyclobenzaprine (FLEXERIL) 10 MG tablet  3 times daily        07/10/20 1040           Claude Manges, PA-C 07/10/20 1047    Lorre Nick, MD 07/16/20 1442

## 2020-07-10 NOTE — ED Notes (Signed)
Patient  Refused recheck vitalsigns

## 2020-07-23 ENCOUNTER — Emergency Department (HOSPITAL_COMMUNITY)
Admission: EM | Admit: 2020-07-23 | Discharge: 2020-07-23 | Disposition: A | Discharge status: Home / Self Care | Payer: Self-pay | Attending: Emergency Medicine | Admitting: Emergency Medicine

## 2020-07-23 ENCOUNTER — Other Ambulatory Visit: Payer: Self-pay

## 2020-07-23 ENCOUNTER — Encounter (HOSPITAL_COMMUNITY): Payer: Self-pay | Admitting: *Deleted

## 2020-07-23 DIAGNOSIS — J45909 Unspecified asthma, uncomplicated: Secondary | ICD-10-CM | POA: Insufficient documentation

## 2020-07-23 DIAGNOSIS — F17210 Nicotine dependence, cigarettes, uncomplicated: Secondary | ICD-10-CM | POA: Insufficient documentation

## 2020-07-23 DIAGNOSIS — M545 Low back pain, unspecified: Secondary | ICD-10-CM | POA: Insufficient documentation

## 2020-07-23 LAB — URINALYSIS, ROUTINE W REFLEX MICROSCOPIC
Bilirubin Urine: NEGATIVE
Glucose, UA: NEGATIVE mg/dL
Hgb urine dipstick: NEGATIVE
Ketones, ur: NEGATIVE mg/dL
Leukocytes,Ua: NEGATIVE
Nitrite: NEGATIVE
Protein, ur: NEGATIVE mg/dL
Specific Gravity, Urine: 1.02 (ref 1.005–1.030)
pH: 7 (ref 5.0–8.0)

## 2020-07-23 MED ORDER — ACETAMINOPHEN 500 MG PO TABS
1000.0000 mg | ORAL_TABLET | Freq: Once | ORAL | Status: AC
  Administered 2020-07-23: 1000 mg via ORAL
  Filled 2020-07-23: qty 2

## 2020-07-23 MED ORDER — KETOROLAC TROMETHAMINE 60 MG/2ML IM SOLN
15.0000 mg | Freq: Once | INTRAMUSCULAR | Status: AC
  Administered 2020-07-23: 15 mg via INTRAMUSCULAR
  Filled 2020-07-23: qty 2

## 2020-07-23 NOTE — ED Triage Notes (Signed)
Pt reports ongoing lower back pain, hx of same and was seen on 6/7 for same. Reports no relief with meds prescribed. Ambulatory at triage.

## 2020-07-23 NOTE — ED Provider Notes (Signed)
[facility] EMERGENCY DEPARTMENT Provider Note   CSN: [id number] Arrival date & time: 07/23/20  5329     History Chief Complaint  Patient presents with   Back Pain    Keith Henry is a 45 y.o. male.  45 yo M with a chief complaints of low back pain.  This is lower and bilateral.  Going on for a few weeks now.  Denies trauma to the area.  Has been trying muscle relaxants without improvement.  He denies difficulty with urination or with bowel movements.  Denies loss of peritoneal sensation denies numbness or weakness to the legs.  Denies IV drug use denies recent surgery or instrumentation of his back.  Denies fever.  Pain aches is worse with movement palpation and twisting ambulation.  The history is provided by the patient.  Back Pain Location:  Lumbar spine Quality:  Aching Radiates to:  Does not radiate Pain severity:  Moderate Onset quality:  Gradual Duration:  3 weeks Timing:  Intermittent Progression:  Waxing and waning Chronicity:  New Relieved by:  Nothing Worsened by:  Palpation, ambulation, touching, sitting and bending Ineffective treatments:  Muscle relaxants Associated symptoms: no abdominal pain, no chest pain, no fever and no headaches       Past Medical History:  Diagnosis Date   Asthma     There are no problems to display for this patient.   Past Surgical History:  Procedure Laterality Date   ANKLE SURGERY     SKIN GRAFT     SKULL FRACTURE ELEVATION     pt unsure of specifics       History reviewed. No pertinent family history.  Social History   Tobacco Use   Smoking status: Every Day    Packs/day: 2.00    Pack years: 0.00    Types: Cigarettes   Smokeless tobacco: Never  Vaping Use   Vaping Use: Never used  Substance Use Topics   Alcohol use: Yes    Alcohol/week: 2.0 standard drinks    Types: 2 Standard drinks or equivalent per week    Comment: occasion   Drug use: Yes    Types: Marijuana    Home  Medications Prior to Admission medications   Medication Sig Start Date End Date Taking? Authorizing Provider  albuterol (VENTOLIN HFA) 108 (90 Base) MCG/ACT inhaler Inhale 1-2 puffs into the lungs every 6 (six) hours as needed for wheezing or shortness of breath. 06/24/20   Sabas Sous, MD  albuterol (VENTOLIN HFA) 108 (90 Base) MCG/ACT inhaler Inhale 2 puffs into the lungs every 4 (four) hours as needed for wheezing or shortness of breath. 07/10/20 08/09/20  Claude Manges, PA-C  doxycycline (VIBRAMYCIN) 100 MG capsule Take 1 capsule (100 mg total) by mouth 2 (two) times daily. 06/17/20   Elson Areas, PA-C  ibuprofen (ADVIL) 600 MG tablet Take 1 tablet (600 mg total) by mouth every 6 (six) hours as needed. 03/22/20   Joy, Shawn C, PA-C  lidocaine (LIDODERM) 5 % Place 1 patch onto the skin daily. Remove & Discard patch within 12 hours or as directed by MD 03/22/20   Joy, Hillard Danker, PA-C  methocarbamol (ROBAXIN) 750 MG tablet Take 1 tablet (750 mg total) by mouth 2 (two) times daily as needed for muscle spasms (or muscle tightness). 03/22/20   Joy, Shawn C, PA-C    Allergies    Other and Methylprednisolone sodium succ  Review of Systems   Review of Systems  Constitutional:  Negative for chills and fever.  HENT:  Negative for congestion and facial swelling.   Eyes:  Negative for discharge and visual disturbance.  Respiratory:  Negative for shortness of breath.   Cardiovascular:  Negative for chest pain and palpitations.  Gastrointestinal:  Negative for abdominal pain, diarrhea and vomiting.  Musculoskeletal:  Positive for back pain. Negative for arthralgias and myalgias.  Skin:  Negative for color change and rash.  Neurological:  Negative for tremors, syncope and headaches.  Psychiatric/Behavioral:  Negative for confusion and dysphoric mood.    Physical Exam Updated Vital Signs BP 130/83 (BP Location: Right Arm)   Pulse 93   Temp 97.6 F (36.4 C) (Oral)   Resp 18   SpO2 100%   Physical  Exam Vitals and nursing note reviewed.  Constitutional:      Appearance: He is well-developed.  HENT:     Head: Normocephalic and atraumatic.  Eyes:     Pupils: Pupils are equal, round, and reactive to light.  Neck:     Vascular: No JVD.  Cardiovascular:     Rate and Rhythm: Normal rate and regular rhythm.     Heart sounds: No murmur heard.   No friction rub. No gallop.  Pulmonary:     Effort: No respiratory distress.     Breath sounds: No wheezing.  Abdominal:     General: There is no distension.     Tenderness: There is no abdominal tenderness. There is no guarding or rebound.  Musculoskeletal:        General: Normal range of motion.     Cervical back: Normal range of motion and neck supple.     Comments: Mild tenderness to palpation about the lower back worse on the left than the right about the SI joint.  Pulse motor and sensation intact to the left lower extremity.  Negative straight leg raise test.  Reflexes are 2+ and equal bilaterally.  No clonus.  Skin:    Coloration: Skin is not pale.     Findings: No rash.  Neurological:     Mental Status: He is alert and oriented to person, place, and time.  Psychiatric:        Behavior: Behavior normal.    ED Results / Procedures / Treatments   Labs (all labs ordered are listed, but only abnormal results are displayed) Labs Reviewed  URINALYSIS, ROUTINE W REFLEX MICROSCOPIC    EKG None  Radiology No results found.  Procedures Procedures   Medications Ordered in ED Medications  ketorolac (TORADOL) injection 15 mg (has no administration in time range)  acetaminophen (TYLENOL) tablet 1,000 mg (has no administration in time range)    ED Course  I have reviewed the triage vital signs and the nursing notes.  Pertinent labs & imaging results that were available during my care of the patient were reviewed by me and considered in my medical decision making (see chart for details).    MDM Rules/Calculators/A&P                           45 yo M with a chief complaints of low back pain.  Going on for a few weeks now.  Atraumatic.  No red flags.  UA obtained in quick look process negative.  Will treat as musculoskeletal.  PCP follow-up.  10:09 AM:  I have discussed the diagnosis/risks/treatment options with the patient and believe the pt to be eligible for discharge home to follow-up with  PCP, Sports, med. We also discussed returning to the ED immediately if new or worsening sx occur. We discussed the sx which are most concerning (e.g., sudden worsening pain, fever, inability to tolerate by mouth) that necessitate immediate return. Medications administered to the patient during their visit and any new prescriptions provided to the patient are listed below.  Medications given during this visit Medications  ketorolac (TORADOL) injection 15 mg (has no administration in time range)  acetaminophen (TYLENOL) tablet 1,000 mg (has no administration in time range)     The patient appears reasonably screen and/or stabilized for discharge and I doubt any other medical condition or other [facility] requiring further screening, evaluation, or treatment in the ED at this time prior to discharge.   Final Clinical Impression(s) / ED Diagnoses Final diagnoses:  Acute bilateral low back pain without sciatica    Rx / DC Orders ED Discharge Orders     None        Melene Plan, DO 07/23/20 1009

## 2020-07-23 NOTE — ED Provider Notes (Signed)
Emergency Medicine Provider Triage Evaluation Note  Keith Henry , a 45 y.o. male  was evaluated in triage.  Pt complains of no injury persistent pain over 2 to 3 weeks.  Low back central radiating over the iliac crest.  Not improved after previous treatment with muscle relaxer.  Review of Systems  Positive: No fevers no chills. Negative: No Pain or difficulty urinating  Physical Exam  BP 130/83 (BP Location: Right Arm)   Pulse 93   Temp 97.6 F (36.4 C) (Oral)   Resp 18   SpO2 100%  Gen:   Awake, no distress  sits and stands without difficulty Resp:  Normal effort  MSK:   Moves extremities without difficulty ambulates with normal gait Other:  No peripheral edema. LE symetric  Medical Decision Making  Medically screening exam initiated at 8:27 AM.  Appropriate orders placed.  Keith Henry was informed that the remainder of the evaluation will be completed by another provider, this initial triage assessment does not replace that evaluation, and the importance of remaining in the ED until their evaluation is complete.     Keith Barrette, MD 07/23/20 [phone]

## 2020-07-23 NOTE — Discharge Instructions (Addendum)

## 2020-08-02 ENCOUNTER — Other Ambulatory Visit: Payer: Self-pay

## 2020-08-02 ENCOUNTER — Emergency Department (HOSPITAL_COMMUNITY)
Admission: EM | Admit: 2020-08-02 | Discharge: 2020-08-02 | Disposition: A | Discharge status: Home / Self Care | Payer: Self-pay | Attending: Emergency Medicine | Admitting: Emergency Medicine

## 2020-08-02 DIAGNOSIS — F17210 Nicotine dependence, cigarettes, uncomplicated: Secondary | ICD-10-CM | POA: Insufficient documentation

## 2020-08-02 DIAGNOSIS — J4541 Moderate persistent asthma with (acute) exacerbation: Secondary | ICD-10-CM | POA: Insufficient documentation

## 2020-08-02 MED ORDER — ALBUTEROL SULFATE HFA 108 (90 BASE) MCG/ACT IN AERS: 2.0000 | INHALATION_SPRAY | RESPIRATORY_TRACT | Status: DC | PRN

## 2020-08-02 MED ORDER — PREDNISONE 20 MG PO TABS
60.0000 mg | ORAL_TABLET | Freq: Once | ORAL | Status: AC
  Administered 2020-08-02: 60 mg via ORAL
  Filled 2020-08-02: qty 3

## 2020-08-02 MED ORDER — ALBUTEROL SULFATE HFA 108 (90 BASE) MCG/ACT IN AERS: 8.0000 | INHALATION_SPRAY | RESPIRATORY_TRACT | Status: DC | PRN

## 2020-08-02 MED ORDER — ALBUTEROL SULFATE HFA 108 (90 BASE) MCG/ACT IN AERS
8.0000 | INHALATION_SPRAY | Freq: Once | RESPIRATORY_TRACT | Status: AC
  Administered 2020-08-02: 8 via RESPIRATORY_TRACT
  Filled 2020-08-02: qty 6.7

## 2020-08-02 MED ORDER — PREDNISONE 20 MG PO TABS: ORAL_TABLET | ORAL | 0 refills | Status: DC

## 2020-08-02 NOTE — ED Notes (Signed)
Patient verbalizes understanding of discharge instructions. Opportunity for questioning and answers were provided. Armband removed by staff, pt discharged from ED ambulatory.   

## 2020-08-02 NOTE — ED Triage Notes (Signed)
Pt c/o asthma attack. Hx of same, states he does not have any medication (inhaler) right now.

## 2020-08-02 NOTE — ED Notes (Signed)
No reply for room x4. Not seen in lobby.

## 2020-08-02 NOTE — Discharge Instructions (Addendum)
Use your inhaler every 4 hours(6 puffs) while awake, return for sudden worsening shortness of breath, or if you need to use your inhaler more often.  ° °

## 2020-08-02 NOTE — ED Notes (Signed)
Pt located in lobby 

## 2020-08-02 NOTE — ED Provider Notes (Signed)
[name] EMERGENCY DEPARTMENT Provider Note   CSN: [id number] Arrival date & time: 08/02/20  1626     History Chief Complaint  Patient presents with   Asthma    Keith Henry is a 45 y.o. male.  45 yo M with a chief complaints of shortness of breath and cough.  Patient states it feels like his prior asthma exacerbations.  Going on for about a week or so.  He is run out of his inhaler.  Things got worse over the past few days and so came for evaluation.  Denies fevers or chills.  Denies sick contacts.  The history is provided by the patient.  Asthma This is a new problem. The current episode started more than 1 week ago. The problem occurs constantly. The problem has been gradually worsening. Associated symptoms include shortness of breath. Pertinent negatives include no chest pain, no abdominal pain and no headaches. Nothing aggravates the symptoms. Nothing relieves the symptoms. He has tried nothing for the symptoms. The treatment provided no relief.      Past Medical History:  Diagnosis Date   Asthma     There are no problems to display for this patient.   Past Surgical History:  Procedure Laterality Date   ANKLE SURGERY     SKIN GRAFT     SKULL FRACTURE ELEVATION     pt unsure of specifics       No family history on file.  Social History   Tobacco Use   Smoking status: Every Day    Packs/day: 2.00    Pack years: 0.00    Types: Cigarettes   Smokeless tobacco: Never  Vaping Use   Vaping Use: Never used  Substance Use Topics   Alcohol use: Yes    Alcohol/week: 2.0 standard drinks    Types: 2 Standard drinks or equivalent per week    Comment: occasion   Drug use: Yes    Types: Marijuana    Home Medications Prior to Admission medications   Medication Sig Start Date End Date Taking? Authorizing Provider  predniSONE (DELTASONE) 20 MG tablet 2 tabs po daily x 4 days 08/02/20  Yes Melene Plan, DO  albuterol (VENTOLIN HFA) 108 (90 Base)  MCG/ACT inhaler Inhale 1-2 puffs into the lungs every 6 (six) hours as needed for wheezing or shortness of breath. 06/24/20   Sabas Sous, MD  albuterol (VENTOLIN HFA) 108 (90 Base) MCG/ACT inhaler Inhale 2 puffs into the lungs every 4 (four) hours as needed for wheezing or shortness of breath. 07/10/20 08/09/20  Claude Manges, PA-C  doxycycline (VIBRAMYCIN) 100 MG capsule Take 1 capsule (100 mg total) by mouth 2 (two) times daily. 06/17/20   Elson Areas, PA-C  ibuprofen (ADVIL) 600 MG tablet Take 1 tablet (600 mg total) by mouth every 6 (six) hours as needed. 03/22/20   Joy, Shawn C, PA-C  lidocaine (LIDODERM) 5 % Place 1 patch onto the skin daily. Remove & Discard patch within 12 hours or as directed by MD 03/22/20   Joy, Hillard Danker, PA-C  methocarbamol (ROBAXIN) 750 MG tablet Take 1 tablet (750 mg total) by mouth 2 (two) times daily as needed for muscle spasms (or muscle tightness). 03/22/20   Joy, Shawn C, PA-C    Allergies    Other and Methylprednisolone sodium succ  Review of Systems   Review of Systems  Constitutional:  Negative for chills and fever.  HENT:  Negative for congestion and facial swelling.  Eyes:  Negative for discharge and visual disturbance.  Respiratory:  Positive for cough and shortness of breath.   Cardiovascular:  Negative for chest pain and palpitations.  Gastrointestinal:  Negative for abdominal pain, diarrhea and vomiting.  Musculoskeletal:  Negative for arthralgias and myalgias.  Skin:  Negative for color change and rash.  Neurological:  Negative for tremors, syncope and headaches.  Psychiatric/Behavioral:  Negative for confusion and dysphoric mood.    Physical Exam Updated Vital Signs BP 111/85 (BP Location: Left Arm)   Pulse 96   Temp 99 F (37.2 C) (Oral)   Resp 16   SpO2 97%   Physical Exam Vitals and nursing note reviewed.  Constitutional:      Appearance: He is well-developed.  HENT:     Head: Normocephalic and atraumatic.  Eyes:     Pupils:  Pupils are equal, round, and reactive to light.  Neck:     Vascular: No JVD.  Cardiovascular:     Rate and Rhythm: Normal rate and regular rhythm.     Heart sounds: No murmur heard.   No friction rub. No gallop.     Comments: Wheezes and prolonged expiratory efforts in all fields. Pulmonary:     Effort: No respiratory distress.     Breath sounds: Wheezing present.  Abdominal:     General: There is no distension.     Tenderness: There is no abdominal tenderness. There is no guarding or rebound.  Musculoskeletal:        General: Normal range of motion.     Cervical back: Normal range of motion and neck supple.  Skin:    Coloration: Skin is not pale.     Findings: No rash.  Neurological:     Mental Status: He is alert and oriented to person, place, and time.  Psychiatric:        Behavior: Behavior normal.    ED Results / Procedures / Treatments   Labs (all labs ordered are listed, but only abnormal results are displayed) Labs Reviewed - No data to display  EKG EKG Interpretation  Date/Time:  Thursday August 02 2020 17:22:11 EDT Ventricular Rate:  94 PR Interval:  144 QRS Duration: 80 QT Interval:  344 QTC Calculation: 430 R Axis:   54 Text Interpretation: Normal sinus rhythm Nonspecific T wave abnormality Abnormal ECG flipped t waves in the inferior and lateral leads seen on prior No significant change since last tracing Confirmed by Melene Plan [phone]) on 08/02/2020 9:35:12 PM  Radiology No results found.  Procedures Procedures   Medications Ordered in ED Medications  predniSONE (DELTASONE) tablet 60 mg (has no administration in time range)  albuterol (VENTOLIN HFA) 108 (90 Base) MCG/ACT inhaler 8 puff (has no administration in time range)  albuterol (VENTOLIN HFA) 108 (90 Base) MCG/ACT inhaler 8 puff (8 puffs Inhalation Given 08/02/20 2219)    ED Course  I have reviewed the triage vital signs and the nursing notes.  Pertinent labs & imaging results that were  available during my care of the patient were reviewed by me and considered in my medical decision making (see chart for details).    MDM Rules/Calculators/A&P                          45 yo M with a chief complaints of shortness of breath.  Feels like his asthma.  Wheezes in all fields on my exam.  Will give 8 puffs albuterol 60 mg prednisone and  reassess.  Patient feeling much better requesting discharge home.  10:33 PM:  I have discussed the diagnosis/risks/treatment options with the patient and believe the pt to be eligible for discharge home to follow-up with PCP. We also discussed returning to the ED immediately if new or worsening sx occur. We discussed the sx which are most concerning (e.g., sudden worsening sob, fever, inability to tolerate by mouth, need to use inhaler more often than every 4 hours) that necessitate immediate return. Medications administered to the patient during their visit and any new prescriptions provided to the patient are listed below.  Medications given during this visit Medications  predniSONE (DELTASONE) tablet 60 mg (has no administration in time range)  albuterol (VENTOLIN HFA) 108 (90 Base) MCG/ACT inhaler 8 puff (has no administration in time range)  albuterol (VENTOLIN HFA) 108 (90 Base) MCG/ACT inhaler 8 puff (8 puffs Inhalation Given 08/02/20 2219)     The patient appears reasonably screen and/or stabilized for discharge and I doubt any other medical condition or other [facility] requiring further screening, evaluation, or treatment in the ED at this time prior to discharge.   Final Clinical Impression(s) / ED Diagnoses Final diagnoses:  Moderate persistent asthma with exacerbation    Rx / DC Orders ED Discharge Orders          Ordered    predniSONE (DELTASONE) 20 MG tablet        08/02/20 2232             Melene Plan, DO 08/02/20 2233

## 2020-08-09 ENCOUNTER — Encounter (HOSPITAL_COMMUNITY): Payer: Self-pay

## 2020-08-09 ENCOUNTER — Emergency Department (HOSPITAL_COMMUNITY): Payer: 59

## 2020-08-09 ENCOUNTER — Emergency Department (HOSPITAL_COMMUNITY)
Admission: EM | Admit: 2020-08-09 | Discharge: 2020-08-10 | Disposition: A | Discharge status: Home / Self Care | Payer: 59 | Attending: Emergency Medicine | Admitting: Emergency Medicine

## 2020-08-09 ENCOUNTER — Other Ambulatory Visit: Payer: Self-pay

## 2020-08-09 DIAGNOSIS — R062 Wheezing: Secondary | ICD-10-CM | POA: Diagnosis not present

## 2020-08-09 DIAGNOSIS — J45901 Unspecified asthma with (acute) exacerbation: Secondary | ICD-10-CM

## 2020-08-09 DIAGNOSIS — J45909 Unspecified asthma, uncomplicated: Secondary | ICD-10-CM | POA: Diagnosis not present

## 2020-08-09 DIAGNOSIS — F17210 Nicotine dependence, cigarettes, uncomplicated: Secondary | ICD-10-CM | POA: Insufficient documentation

## 2020-08-09 DIAGNOSIS — R0602 Shortness of breath: Secondary | ICD-10-CM | POA: Diagnosis present

## 2020-08-09 NOTE — ED Triage Notes (Signed)
Pt reports having an asthma attack off and on today. Having to use albuterol inhaler more often than usual.

## 2020-08-10 ENCOUNTER — Telehealth: Payer: Self-pay | Admitting: Surgery

## 2020-08-10 MED ORDER — ALBUTEROL SULFATE (2.5 MG/3ML) 0.083% IN NEBU: 2.5000 mg | INHALATION_SOLUTION | Freq: Four times a day (QID) | RESPIRATORY_TRACT | 12 refills | Status: DC | PRN

## 2020-08-10 MED ORDER — MOMETASONE FURO-FORMOTEROL FUM 200-5 MCG/ACT IN AERO
2.0000 | INHALATION_SPRAY | Freq: Two times a day (BID) | RESPIRATORY_TRACT | Status: DC
  Filled 2020-08-10: qty 8.8

## 2020-08-10 MED ORDER — PREDNISONE 20 MG PO TABS
60.0000 mg | ORAL_TABLET | Freq: Once | ORAL | Status: AC
  Administered 2020-08-10: 60 mg via ORAL
  Filled 2020-08-10: qty 3

## 2020-08-10 MED ORDER — ALBUTEROL SULFATE HFA 108 (90 BASE) MCG/ACT IN AERS: 2.0000 | INHALATION_SPRAY | Freq: Four times a day (QID) | RESPIRATORY_TRACT | 0 refills | Status: DC | PRN

## 2020-08-10 MED ORDER — AEROCHAMBER Z-STAT PLUS/MEDIUM MISC
1.0000 | Freq: Once | Status: AC
  Administered 2020-08-10: 1
  Filled 2020-08-10: qty 1

## 2020-08-10 MED ORDER — IPRATROPIUM-ALBUTEROL 0.5-2.5 (3) MG/3ML IN SOLN
3.0000 mL | Freq: Once | RESPIRATORY_TRACT | Status: AC
  Administered 2020-08-10: 3 mL via RESPIRATORY_TRACT
  Filled 2020-08-10: qty 3

## 2020-08-10 MED ORDER — PREDNISONE 10 MG (21) PO TBPK: ORAL_TABLET | Freq: Every day | ORAL | 0 refills | Status: DC

## 2020-08-10 NOTE — Telephone Encounter (Signed)
ED RNCM attempted to reach out to patient to assist with transitional care post ED discharge. Unfortunately, Patient does not have a working phone number. FYI placed in chart.

## 2020-08-10 NOTE — Discharge Instructions (Addendum)
Thank you for allowing me to care for you today in the Emergency Department.   Use 2 puffs of your albuterol inhaler or an albuterol nebulizer treatment every 4 hours as needed for wheezing or shortness of breath.  You can use 2 puffs of the Dulera inhaler morning and night.  This is a maintenance inhaler and you should use it to help prevent your symptoms from getting worse.  You should not use this inhaler if you are having trouble breathing or respiratory distress all of a sudden.  Use prednisone as prescribed starting tomorrow.  Her first dose was given tonight in the emergency department.  Please make sure to answer your phone if you have an unknown number as you should be receiving a call from our transition care team to help you get established with the clinic associated with the hospital to help you with medications and to help better manage your asthma.  Return to the emergency department if you develop respiratory distress, if you pass out, if you develop shortness of breath and chest pain that does not feel like her usual asthma exacerbations, if you have new numbness or weakness, if you become sweaty with chest pain, or have other new, concerning symptoms.

## 2020-08-10 NOTE — ED Provider Notes (Signed)
[facility]-EMERGENCY DEPT Provider Note   CSN: [id number] Arrival date & time: 08/09/20  2128     History No chief complaint on file.   TANUJ MULLENS is a 45 y.o. male with a history of asthma, tobacco use disorder in remission who presents to the emergency department with a chief complaint of shortness of breath.  The patient endorses shortness of breath, wheezing, nonproductive cough, and chest tightness since yesterday.  His symptoms have been worsening today.  He states that his symptoms feel similar to previous asthma exacerbations.  He has been using his home albuterol and albuterol nebulizer without improvement in his symptoms.  He reports that he was seen for the same on June 30 and had a 4-day course of prednisone.  Symptoms seem to initially improve, but never fully resolved until his symptoms worsen again yesterday.  He was previously 2 pack/day smoker, but stopped using tobacco products several months ago.  He denies fever, chills, vomiting, diarrhea, dizziness, lightheadedness, syncope, chest pain, abdominal pain.  No other treatment prior to arrival.  No known sick contacts.  He reports a previous admission to the ICU and intubation secondary to asthma exacerbation when he was a child.  The history is provided by the patient and medical records. No language interpreter was used.      Past Medical History:  Diagnosis Date  . Asthma     There are no problems to display for this patient.   Past Surgical History:  Procedure Laterality Date  . ANKLE SURGERY    . SKIN GRAFT    . SKULL FRACTURE ELEVATION     pt unsure of specifics       No family history on file.  Social History   Tobacco Use  . Smoking status: Every Day    Packs/day: 2.00    Pack years: 0.00    Types: Cigarettes  . Smokeless tobacco: Never  Vaping Use  . Vaping Use: Never used  Substance Use Topics  . Alcohol use: Yes    Alcohol/week: 2.0 standard drinks    Types:  2 Standard drinks or equivalent per week    Comment: occasion  . Drug use: Yes    Types: Marijuana    Home Medications Prior to Admission medications   Medication Sig Start Date End Date Taking? Authorizing Provider  albuterol (VENTOLIN HFA) 108 (90 Base) MCG/ACT inhaler Inhale 1-2 puffs into the lungs every 6 (six) hours as needed for wheezing or shortness of breath. 06/24/20   Sabas Sous, MD  albuterol (VENTOLIN HFA) 108 (90 Base) MCG/ACT inhaler Inhale 2 puffs into the lungs every 4 (four) hours as needed for wheezing or shortness of breath. 07/10/20 08/09/20  Claude Manges, PA-C  doxycycline (VIBRAMYCIN) 100 MG capsule Take 1 capsule (100 mg total) by mouth 2 (two) times daily. 06/17/20   Elson Areas, PA-C  ibuprofen (ADVIL) 600 MG tablet Take 1 tablet (600 mg total) by mouth every 6 (six) hours as needed. 03/22/20   Joy, Shawn C, PA-C  lidocaine (LIDODERM) 5 % Place 1 patch onto the skin daily. Remove & Discard patch within 12 hours or as directed by MD 03/22/20   Joy, Hillard Danker, PA-C  methocarbamol (ROBAXIN) 750 MG tablet Take 1 tablet (750 mg total) by mouth 2 (two) times daily as needed for muscle spasms (or muscle tightness). 03/22/20   Joy, Shawn C, PA-C  predniSONE (DELTASONE) 20 MG tablet 2 tabs po daily x 4 days 08/02/20  Melene Plan, DO    Allergies    Other and Methylprednisolone sodium succ  Review of Systems   Review of Systems  Constitutional:  Negative for appetite change and fever.  HENT:  Negative for congestion and sore throat.   Respiratory:  Positive for cough, chest tightness and wheezing. Negative for shortness of breath and stridor.   Cardiovascular:  Negative for chest pain, palpitations and leg swelling.  Gastrointestinal:  Negative for abdominal pain, constipation, diarrhea, nausea and vomiting.  Genitourinary:  Negative for dysuria.  Musculoskeletal:  Negative for back pain, myalgias, neck pain and neck stiffness.  Skin:  Negative for rash.   Allergic/Immunologic: Negative for immunocompromised state.  Neurological:  Negative for dizziness, seizures, syncope, weakness, numbness and headaches.  Psychiatric/Behavioral:  Negative for confusion.    Physical Exam Updated Vital Signs BP 132/74   Pulse 73   Temp 98.1 F (36.7 C) (Oral)   Resp 18   Ht 5\' 10"  (1.778 m)   Wt 90.7 kg   SpO2 96%   BMI 28.70 kg/m   Physical Exam Vitals and nursing note reviewed.  Constitutional:      General: He is not in acute distress.    Appearance: He is well-developed. He is not ill-appearing, toxic-appearing or diaphoretic.  HENT:     Head: Normocephalic.  Eyes:     Conjunctiva/sclera: Conjunctivae normal.  Cardiovascular:     Rate and Rhythm: Normal rate and regular rhythm.     Pulses: Normal pulses.     Heart sounds: Normal heart sounds. No murmur heard.   No friction rub. No gallop.  Pulmonary:     Effort: Pulmonary effort is normal. No respiratory distress.     Breath sounds: No stridor. Wheezing present. No rhonchi or rales.     Comments: Expiratory wheezes bilaterally in all fields.  No rhonchi or rales.  No retractions, accessory muscle use, or tracheal tugging.  Patient is able to speak in complete, fluent sentences without increased work of breathing. Chest:     Chest wall: No tenderness.  Abdominal:     General: There is no distension.     Palpations: Abdomen is soft. There is no mass.     Tenderness: There is no abdominal tenderness. There is no right CVA tenderness, left CVA tenderness, guarding or rebound.     Hernia: No hernia is present.  Musculoskeletal:     Cervical back: Neck supple.  Skin:    General: Skin is warm and dry.  Neurological:     Mental Status: He is alert.  Psychiatric:        Behavior: Behavior normal.    ED Results / Procedures / Treatments   Labs (all labs ordered are listed, but only abnormal results are displayed) Labs Reviewed - No data to display  EKG None  Radiology DG Chest 2  View  Result Date: 08/09/2020 CLINICAL DATA:  Shortness of breath and asthma. EXAM: CHEST - 2 VIEW COMPARISON:  None. FINDINGS: The heart size and mediastinal contours are within normal limits. Both lungs are clear. The visualized skeletal structures are unremarkable. IMPRESSION: No active cardiopulmonary disease. Electronically Signed   By: 10/10/2020 M.D.   On: 08/09/2020 22:17    Procedures Procedures   Medications Ordered in ED Medications  mometasone-formoterol (DULERA) 200-5 MCG/ACT inhaler 2 puff (has no administration in time range)  AeroChamber Plus Flo-Vu Small device MISC 1 each (has no administration in time range)  ipratropium-albuterol (DUONEB) 0.5-2.5 (3) MG/3ML nebulizer solution 3  mL (3 mLs Nebulization Given 08/10/20 0235)  predniSONE (DELTASONE) tablet 60 mg (60 mg Oral Given 08/10/20 0233)    ED Course  I have reviewed the triage vital signs and the nursing notes.  Pertinent labs & imaging results that were available during my care of the patient were reviewed by me and considered in my medical decision making (see chart for details).    MDM Rules/Calculators/A&P                          44 year old male with a history of asthma, tobacco use disorder in remission who presents the emergency department with worsening wheezing, shortness of breath, chest tightness, nonproductive cough since yesterday.  Patient states that symptoms feel similar to previous asthma exacerbation.  He has been increasing his home albuterol usage with his inhaler and nebulizer without improvement in his symptoms.  He was seen in the ER for the same on June 30 and was discharged with a burst of prednisone.  He initially had improvement in his symptoms after the first day or 2, but then symptoms did not fully resolve.  He has not established with primary care.  Vital signs are stable.  On exam, he has diffuse expiratory wheezes in all fields bilaterally.  No increased work of breathing.  He was  given DuoNeb x2 and on reexamination lungs were clear to auscultation bilaterally.  He was given 60 mg of prednisone.  Given frequent ER visits for asthma exacerbation, will start the patient on Dulera as a maintenance inhaler as he does report that he was on inhaled corticosteroids as a child.  We will also place a transitional care consult to help the patient get established with [facility] and wellness to for PCP follow-up.  At this time, have a low suspicion for ACS, PE, aortic dissection, esophageal rupture, bacterial pneumonia, COVID-19.  ER return precautions given.  He is hemodynamically stable no acute distress.  Safer discharge home with outpatient follow-up as discussed.  Final Clinical Impression(s) / ED Diagnoses Final diagnoses:  None    Rx / DC Orders ED Discharge Orders     None        Barkley Boards, PA-C 08/10/20 0339    Glynn Octave, MD 08/10/20 [phone]

## 2020-08-10 NOTE — ED Notes (Signed)
No answer when called for rooming  x1 

## 2020-08-10 NOTE — ED Notes (Signed)
Pt verbalized understanding of d/c, medication, and follow up care. Ambulatory with steady gait.  

## 2020-09-01 ENCOUNTER — Other Ambulatory Visit: Payer: Self-pay

## 2020-09-01 ENCOUNTER — Emergency Department (HOSPITAL_COMMUNITY): Payer: 59

## 2020-09-01 ENCOUNTER — Emergency Department (HOSPITAL_COMMUNITY)
Admission: EM | Admit: 2020-09-01 | Discharge: 2020-09-01 | Disposition: A | Discharge status: Home / Self Care | Payer: 59 | Attending: Emergency Medicine | Admitting: Emergency Medicine

## 2020-09-01 DIAGNOSIS — R0602 Shortness of breath: Secondary | ICD-10-CM | POA: Insufficient documentation

## 2020-09-01 DIAGNOSIS — F17210 Nicotine dependence, cigarettes, uncomplicated: Secondary | ICD-10-CM | POA: Diagnosis not present

## 2020-09-01 DIAGNOSIS — Z20822 Contact with and (suspected) exposure to covid-19: Secondary | ICD-10-CM | POA: Insufficient documentation

## 2020-09-01 DIAGNOSIS — K0889 Other specified disorders of teeth and supporting structures: Secondary | ICD-10-CM

## 2020-09-01 DIAGNOSIS — R0603 Acute respiratory distress: Secondary | ICD-10-CM | POA: Insufficient documentation

## 2020-09-01 DIAGNOSIS — R062 Wheezing: Secondary | ICD-10-CM | POA: Diagnosis not present

## 2020-09-01 DIAGNOSIS — R0789 Other chest pain: Secondary | ICD-10-CM | POA: Diagnosis not present

## 2020-09-01 DIAGNOSIS — J4551 Severe persistent asthma with (acute) exacerbation: Secondary | ICD-10-CM

## 2020-09-01 LAB — COMPREHENSIVE METABOLIC PANEL
ALT: 26 U/L (ref 0–44)
AST: 25 U/L (ref 15–41)
Albumin: 3.3 g/dL — ABNORMAL LOW (ref 3.5–5.0)
Alkaline Phosphatase: 91 U/L (ref 38–126)
Anion gap: 7 (ref 5–15)
BUN: 12 mg/dL (ref 6–20)
CO2: 22 mmol/L (ref 22–32)
Calcium: 8.5 mg/dL — ABNORMAL LOW (ref 8.9–10.3)
Chloride: 104 mmol/L (ref 98–111)
Creatinine, Ser: 0.98 mg/dL (ref 0.61–1.24)
GFR, Estimated: >60 mL/min (ref >60)
Glucose, Bld: 104 mg/dL — ABNORMAL HIGH (ref 70–99)
Potassium: 4.4 mmol/L (ref 3.5–5.1)
Sodium: 133 mmol/L — ABNORMAL LOW (ref 135–145)
Total Bilirubin: 0.9 mg/dL (ref 0.3–1.2)
Total Protein: 6.3 g/dL — ABNORMAL LOW (ref 6.5–8.1)

## 2020-09-01 LAB — CBC WITH DIFFERENTIAL/PLATELET
Abs Immature Granulocytes: 0.03 10*3/uL (ref 0.00–0.07)
Basophils Absolute: 0.1 10*3/uL (ref 0.0–0.1)
Basophils Relative: 1 %
Eosinophils Absolute: 0.1 10*3/uL (ref 0.0–0.5)
Eosinophils Relative: 2 %
HCT: 45.8 % (ref 39.0–52.0)
Hemoglobin: 14.3 g/dL (ref 13.0–17.0)
Immature Granulocytes: 0 %
Lymphocytes Relative: 11 %
Lymphs Abs: 0.9 10*3/uL (ref 0.7–4.0)
MCH: 33.3 pg (ref 26.0–34.0)
MCHC: 31.2 g/dL (ref 30.0–36.0)
MCV: 106.8 fL — ABNORMAL HIGH (ref 80.0–100.0)
Monocytes Absolute: 0.3 10*3/uL (ref 0.1–1.0)
Monocytes Relative: 3 %
Neutro Abs: 7.1 10*3/uL (ref 1.7–7.7)
Neutrophils Relative %: 83 %
Platelets: 259 10*3/uL (ref 150–400)
RBC: 4.29 MIL/uL (ref 4.22–5.81)
RDW: 11.9 % (ref 11.5–15.5)
WBC: 8.5 10*3/uL (ref 4.0–10.5)
nRBC: 0 % (ref 0.0–0.2)

## 2020-09-01 LAB — TROPONIN I (HIGH SENSITIVITY)
Troponin I (High Sensitivity): 4 ng/L (ref <18)
Troponin I (High Sensitivity): 4 ng/L (ref <18)

## 2020-09-01 LAB — RESP PANEL BY RT-PCR (FLU A&B, COVID) ARPGX2
Influenza A by PCR: NEGATIVE
Influenza B by PCR: NEGATIVE
SARS Coronavirus 2 by RT PCR: NEGATIVE

## 2020-09-01 MED ORDER — ALBUTEROL SULFATE HFA 108 (90 BASE) MCG/ACT IN AERS
8.0000 | INHALATION_SPRAY | Freq: Once | RESPIRATORY_TRACT | Status: AC
  Administered 2020-09-01: 8 via RESPIRATORY_TRACT
  Filled 2020-09-01: qty 6.7

## 2020-09-01 MED ORDER — IPRATROPIUM-ALBUTEROL 0.5-2.5 (3) MG/3ML IN SOLN
3.0000 mL | Freq: Once | RESPIRATORY_TRACT | Status: AC
  Administered 2020-09-01: 3 mL via RESPIRATORY_TRACT
  Filled 2020-09-01: qty 3

## 2020-09-01 MED ORDER — PREDNISONE 20 MG PO TABS: 60.0000 mg | ORAL_TABLET | Freq: Once | ORAL | Status: DC

## 2020-09-01 MED ORDER — AMOXICILLIN-POT CLAVULANATE 875-125 MG PO TABS: 1.0000 | ORAL_TABLET | Freq: Two times a day (BID) | ORAL | 0 refills | Status: AC

## 2020-09-01 MED ORDER — IPRATROPIUM BROMIDE HFA 17 MCG/ACT IN AERS
3.0000 | INHALATION_SPRAY | Freq: Once | RESPIRATORY_TRACT | Status: AC
  Administered 2020-09-01: 3 via RESPIRATORY_TRACT
  Filled 2020-09-01: qty 12.9

## 2020-09-01 MED ORDER — PREDNISONE 10 MG PO TABS: 40.0000 mg | ORAL_TABLET | Freq: Every day | ORAL | 0 refills | Status: AC

## 2020-09-01 MED ORDER — AMOXICILLIN-POT CLAVULANATE 875-125 MG PO TABS
1.0000 | ORAL_TABLET | Freq: Once | ORAL | Status: AC
  Administered 2020-09-01: 1 via ORAL
  Filled 2020-09-01: qty 1

## 2020-09-01 MED ORDER — PREDNISONE 20 MG PO TABS
60.0000 mg | ORAL_TABLET | Freq: Once | ORAL | Status: AC
  Administered 2020-09-01: 60 mg via ORAL
  Filled 2020-09-01: qty 3

## 2020-09-01 MED ORDER — KETOROLAC TROMETHAMINE 60 MG/2ML IM SOLN
60.0000 mg | Freq: Once | INTRAMUSCULAR | Status: AC
  Administered 2020-09-01: 60 mg via INTRAMUSCULAR
  Filled 2020-09-01: qty 2

## 2020-09-01 NOTE — ED Provider Notes (Signed)
Emergency Medicine Provider Triage Evaluation Note  Keith Henry , a 45 y.o. male  was evaluated in triage.  Pt complains of asthma exacerbation and dental pain.  This is a recurrent problem.  Patient doesn't have insurance or a PCP.  Seen earlier this month for the same.  Began to worsen a couple of days ago.  Review of Systems  Positive: Sob, chest tightness Negative: Fever, chills  Physical Exam  There were no vitals taken for this visit. Gen:   Awake, no distress   Resp:  Normal effort, no respiratory distress MSK:   Moves extremities without difficulty  Other:    Medical Decision Making  Medically screening exam initiated at 12:41 AM.  Appropriate orders placed.  Keith Henry was informed that the remainder of the evaluation will be completed by another provider, this initial triage assessment does not replace that evaluation, and the importance of remaining in the ED until their evaluation is complete.  Asthma exacerbation Dental pain   Keith Horseman, PA-C 09/01/20 0042    Henry, April, MD 09/01/20 [phone]

## 2020-09-01 NOTE — ED Triage Notes (Signed)
Pt c/o "my asthma is acting up" and c/o a toothache a few days ago. Denies fevers. No acute resp distress in triage.

## 2020-09-01 NOTE — ED Notes (Signed)
Patient transported to CT 

## 2020-09-01 NOTE — Discharge Instructions (Addendum)
Was a pleasure caring for you!  You came in for an asthma exacerbation, and we gave you some breathing treatments, and prednisone.  I am glad you are feeling better. Take the prednisone 40mg  daily for the next 5 days. Continue to take your albuterol inhaler as needed, and return if you develop difficulty breathing despite your albuterol

## 2020-09-01 NOTE — ED Provider Notes (Signed)
[facility] EMERGENCY DEPARTMENT Provider Note   CSN: [id number] Arrival date & time: 09/01/20  0019     History Chief Complaint  Patient presents with   Asthma   Dental Pain    Keith Henry is a 45 y.o. male with PMH of asthma presents with wheezing and shortness of breath, and dental pain.  He states he has been wheezing for weeks, because he did not have money for an inhaler.  Endorses coughing up sputum. He also endorses tooth pain that started a few days ago.  Denies fever or recent infection.  Denies chest pain, abdominal pain, nausea, vomiting.   Patient endorses having to be intubated in the ICU when he was younger for asthma exacerbation.  Of note patient was seen in the ED on 7/8 for asthma exacerbation and was given DuoNebs and prednisone with improvement in symptoms    Past Medical History:  Diagnosis Date   Asthma     There are no problems to display for this patient.   Past Surgical History:  Procedure Laterality Date   ANKLE SURGERY     SKIN GRAFT     SKULL FRACTURE ELEVATION     pt unsure of specifics       No family history on file.  Social History   Tobacco Use   Smoking status: Every Day    Packs/day: 2.00    Types: Cigarettes   Smokeless tobacco: Never  Vaping Use   Vaping Use: Never used  Substance Use Topics   Alcohol use: Yes    Alcohol/week: 2.0 standard drinks    Types: 2 Standard drinks or equivalent per week    Comment: occasion   Drug use: Yes    Types: Marijuana    Home Medications Prior to Admission medications   Medication Sig Start Date End Date Taking? Authorizing Provider  albuterol (PROVENTIL) (2.5 MG/3ML) 0.083% nebulizer solution Take 3 mLs (2.5 mg total) by nebulization every 6 (six) hours as needed for wheezing or shortness of breath. 08/10/20   McDonald, Mia A, PA-C  albuterol (VENTOLIN HFA) 108 (90 Base) MCG/ACT inhaler Inhale 2 puffs into the lungs every 6 (six) hours as needed for wheezing or  shortness of breath. 08/10/20   McDonald, Mia A, PA-C  doxycycline (VIBRAMYCIN) 100 MG capsule Take 1 capsule (100 mg total) by mouth 2 (two) times daily. 06/17/20   Elson Areas, PA-C  ibuprofen (ADVIL) 600 MG tablet Take 1 tablet (600 mg total) by mouth every 6 (six) hours as needed. 03/22/20   Joy, Shawn C, PA-C  lidocaine (LIDODERM) 5 % Place 1 patch onto the skin daily. Remove & Discard patch within 12 hours or as directed by MD 03/22/20   Joy, Hillard Danker, PA-C  methocarbamol (ROBAXIN) 750 MG tablet Take 1 tablet (750 mg total) by mouth 2 (two) times daily as needed for muscle spasms (or muscle tightness). 03/22/20   Joy, Shawn C, PA-C  predniSONE (STERAPRED UNI-PAK 21 TAB) 10 MG (21) TBPK tablet Take by mouth daily. Take 6 tabs by mouth daily  for 2 days, then 5 tabs for 2 days, then 4 tabs for 2 days, then 3 tabs for 2 days, 2 tabs for 2 days, then 1 tab by mouth daily for 2 days 08/10/20   McDonald, Mia A, PA-C    Allergies    Other and Methylprednisolone sodium succ  Review of Systems   Review of Systems  Constitutional:  Negative for chills and fever.  HENT:  Positive for dental problem.   Respiratory:  Positive for cough, shortness of breath and wheezing.   Cardiovascular:  Negative for chest pain.  All other systems reviewed and are negative.  Physical Exam Updated Vital Signs BP 125/78   Pulse 67   Temp 97.9 F (36.6 C)   Resp 20   SpO2 98%   Physical Exam Constitutional:      General: He is in acute distress.  Eyes:     Extraocular Movements: Extraocular movements intact.     Conjunctiva/sclera: Conjunctivae normal.  Cardiovascular:     Rate and Rhythm: Normal rate and regular rhythm.     Pulses: Normal pulses.     Heart sounds: Normal heart sounds.  Pulmonary:     Effort: Respiratory distress present.     Breath sounds: Wheezing present.  Chest:     Chest wall: Tenderness present.  Abdominal:     General: There is no distension.     Palpations: Abdomen is soft.      Tenderness: There is no abdominal tenderness.  Musculoskeletal:     Cervical back: Normal range of motion and neck supple.  Skin:    General: Skin is warm and dry.  Neurological:     Mental Status: He is alert.    ED Results / Procedures / Treatments   Labs (all labs ordered are listed, but only abnormal results are displayed) Labs Reviewed  CBC WITH DIFFERENTIAL/PLATELET - Abnormal; Notable for the following components:      Result Value   MCV 106.8 (*)    All other components within normal limits  COMPREHENSIVE METABOLIC PANEL - Abnormal; Notable for the following components:   Sodium 133 (*)    Glucose, Bld 104 (*)    Calcium 8.5 (*)    Total Protein 6.3 (*)    Albumin 3.3 (*)    All other components within normal limits  RESP PANEL BY RT-PCR (FLU A&B, COVID) ARPGX2  TROPONIN I (HIGH SENSITIVITY)  TROPONIN I (HIGH SENSITIVITY)    EKG EKG Interpretation  Date/Time:  Saturday September 01 2020 13:14:44 EDT Ventricular Rate:  66 PR Interval:  179 QRS Duration: 90 QT Interval:  403 QTC Calculation: 423 R Axis:   59 Text Interpretation: Sinus arrhythmia Abnormal R-wave progression, early transition Minimal ST elevation, anterior leads No significant change since last tracing Confirmed by Alvira Monday ([id number]) on 09/01/2020 2:02:34 PM  Radiology DG Chest 2 View  Result Date: 09/01/2020 CLINICAL DATA:  Shortness of breath with history of asthma. EXAM: CHEST - 2 VIEW COMPARISON:  08/09/2020 FINDINGS: Numerous leads and wires project over the chest. Midline trachea. Normal heart size and mediastinal contours. No pleural effusion or pneumothorax. Clear lungs. Remote right clavicular fracture IMPRESSION: No acute osseous abnormality. Electronically Signed   By: Jeronimo Greaves M.D.   On: 09/01/2020 14:13    Procedures Procedures   Medications Ordered in ED Medications  ipratropium-albuterol (DUONEB) 0.5-2.5 (3) MG/3ML nebulizer solution 3 mL (has no administration in time range)   albuterol (VENTOLIN HFA) 108 (90 Base) MCG/ACT inhaler 8 puff (8 puffs Inhalation Given 09/01/20 0955)  ipratropium (ATROVENT HFA) inhaler 3 puff (3 puffs Inhalation Given 09/01/20 0955)  predniSONE (DELTASONE) tablet 60 mg (60 mg Oral Given 09/01/20 0955)  amoxicillin-clavulanate (AUGMENTIN) 875-125 MG per tablet 1 tablet (1 tablet Oral Given 09/01/20 0955)  ketorolac (TORADOL) injection 60 mg (60 mg Intramuscular Given 09/01/20 1021)  ipratropium-albuterol (DUONEB) 0.5-2.5 (3) MG/3ML nebulizer solution 3 mL (3 mLs Nebulization  Given 09/01/20 1311)    ED Course  I have reviewed the triage vital signs and the nursing notes.  Pertinent labs & imaging results that were available during my care of the patient were reviewed by me and considered in my medical decision making (see chart for details).    MDM Rules/Calculators/A&P                            Keith Henry is a 45 y.o. male who presents in respiratory distress due to running out of his inhaler, and dental pain. He states he has been wheezing for weeks, because he did not have money for an inhaler. Endorses several days of dental pain. On exam patient working hard to breathe, with severe auditory wheezing.  Gave albuterol 8puffs, Ipratropium, followed by Duonebs x1. Prednisone 60mg . Patient improved after his breathing treatments and steroid.  Discharged patient with his albuterol and ipratropium inhalers and with strict return precautions. Advised to establish care at [facility]. For concern for dental infection giving Augmentin to take for 5 days and recommended dental follow up and provided resources.    Final Clinical Impression(s) / ED Diagnoses Final diagnoses:  None    Rx / DC Orders ED Discharge Orders     None        , DO 09/01/20 1541    09/03/20, MD 09/01/20 2337

## 2020-09-21 ENCOUNTER — Encounter (HOSPITAL_COMMUNITY): Payer: Self-pay | Admitting: Emergency Medicine

## 2020-09-21 ENCOUNTER — Other Ambulatory Visit: Payer: Self-pay

## 2020-09-21 ENCOUNTER — Emergency Department (HOSPITAL_COMMUNITY)
Admission: EM | Admit: 2020-09-21 | Discharge: 2020-09-22 | Disposition: A | Discharge status: Home / Self Care | Payer: 59 | Attending: Emergency Medicine | Admitting: Emergency Medicine

## 2020-09-21 DIAGNOSIS — F17210 Nicotine dependence, cigarettes, uncomplicated: Secondary | ICD-10-CM | POA: Diagnosis not present

## 2020-09-21 DIAGNOSIS — J45901 Unspecified asthma with (acute) exacerbation: Secondary | ICD-10-CM | POA: Diagnosis not present

## 2020-09-21 DIAGNOSIS — R0602 Shortness of breath: Secondary | ICD-10-CM | POA: Diagnosis present

## 2020-09-21 DIAGNOSIS — Z760 Encounter for issue of repeat prescription: Secondary | ICD-10-CM | POA: Insufficient documentation

## 2020-09-21 MED ORDER — ALBUTEROL SULFATE HFA 108 (90 BASE) MCG/ACT IN AERS
2.0000 | INHALATION_SPRAY | RESPIRATORY_TRACT | Status: DC | PRN
  Administered 2020-09-21: 2 via RESPIRATORY_TRACT
  Filled 2020-09-21: qty 6.7

## 2020-09-21 NOTE — ED Triage Notes (Signed)
Patient with hx of asthma, ran out of medications earlier this week. Patient normally uses an inhaler and nebulizer. Wheezing noted. NAD.

## 2020-09-22 MED ORDER — ALBUTEROL SULFATE (2.5 MG/3ML) 0.083% IN NEBU
5.0000 mg | INHALATION_SOLUTION | Freq: Once | RESPIRATORY_TRACT | Status: AC
  Administered 2020-09-22: 5 mg via RESPIRATORY_TRACT
  Filled 2020-09-22: qty 6

## 2020-09-22 MED ORDER — ALBUTEROL SULFATE HFA 108 (90 BASE) MCG/ACT IN AERS: 2.0000 | INHALATION_SPRAY | Freq: Four times a day (QID) | RESPIRATORY_TRACT | 0 refills | Status: DC | PRN

## 2020-09-22 MED ORDER — DEXAMETHASONE SODIUM PHOSPHATE 10 MG/ML IJ SOLN
10.0000 mg | Freq: Once | INTRAMUSCULAR | Status: AC
  Administered 2020-09-22: 10 mg via INTRAMUSCULAR
  Filled 2020-09-22: qty 1

## 2020-09-22 MED ORDER — IPRATROPIUM-ALBUTEROL 0.5-2.5 (3) MG/3ML IN SOLN
RESPIRATORY_TRACT | Status: AC
  Filled 2020-09-22: qty 3

## 2020-09-22 MED ORDER — IPRATROPIUM BROMIDE 0.02 % IN SOLN
0.5000 mg | Freq: Once | RESPIRATORY_TRACT | Status: AC
  Administered 2020-09-22: 0.5 mg via RESPIRATORY_TRACT
  Filled 2020-09-22: qty 2.5

## 2020-09-22 MED ORDER — PREDNISONE 20 MG PO TABS: 40.0000 mg | ORAL_TABLET | Freq: Every day | ORAL | 0 refills | Status: DC

## 2020-09-22 NOTE — ED Provider Notes (Signed)
[facility]-EMERGENCY DEPT Provider Note   CSN: [id number] Arrival date & time: 09/21/20  2315     History Chief Complaint  Patient presents with   Asthma   Medication Refill    Keith Henry is a 45 y.o. male.  45 year old male with a history of asthma presents to the emergency department for shortness of breath.  He states that he ran out of his albuterol inhaler 3 weeks ago.  He has been experiencing uncontrolled asthma symptoms for at least that long.  Wheezing is intermittent, unknown modifying factors.  Thinks he may have seasonal allergies.  No fever, cough, congestion, sore throat.  The history is provided by the patient. No language interpreter was used.  Asthma  Medication Refill     Past Medical History:  Diagnosis Date   Asthma     There are no problems to display for this patient.   Past Surgical History:  Procedure Laterality Date   ANKLE SURGERY     SKIN GRAFT     SKULL FRACTURE ELEVATION     pt unsure of specifics       No family history on file.  Social History   Tobacco Use   Smoking status: Every Day    Packs/day: 2.00    Types: Cigarettes   Smokeless tobacco: Never  Vaping Use   Vaping Use: Never used  Substance Use Topics   Alcohol use: Yes    Alcohol/week: 2.0 standard drinks    Types: 2 Standard drinks or equivalent per week    Comment: occasion   Drug use: Yes    Types: Marijuana    Home Medications Prior to Admission medications   Medication Sig Start Date End Date Taking? Authorizing Provider  predniSONE (DELTASONE) 20 MG tablet Take 2 tablets (40 mg total) by mouth daily. 09/22/20  Yes Antony Madura, PA-C  albuterol (PROVENTIL) (2.5 MG/3ML) 0.083% nebulizer solution Take 3 mLs (2.5 mg total) by nebulization every 6 (six) hours as needed for wheezing or shortness of breath. 08/10/20   McDonald, Mia A, PA-C  albuterol (VENTOLIN HFA) 108 (90 Base) MCG/ACT inhaler Inhale 2 puffs into the lungs every 6 (six)  hours as needed for wheezing or shortness of breath. 09/22/20   Antony Madura, PA-C  doxycycline (VIBRAMYCIN) 100 MG capsule Take 1 capsule (100 mg total) by mouth 2 (two) times daily. 06/17/20   Elson Areas, PA-C  ibuprofen (ADVIL) 600 MG tablet Take 1 tablet (600 mg total) by mouth every 6 (six) hours as needed. 03/22/20   Joy, Shawn C, PA-C  lidocaine (LIDODERM) 5 % Place 1 patch onto the skin daily. Remove & Discard patch within 12 hours or as directed by MD 03/22/20   Joy, Hillard Danker, PA-C  methocarbamol (ROBAXIN) 750 MG tablet Take 1 tablet (750 mg total) by mouth 2 (two) times daily as needed for muscle spasms (or muscle tightness). 03/22/20   Joy, Shawn C, PA-C    Allergies    Other and Methylprednisolone sodium succ  Review of Systems   Review of Systems Ten systems reviewed and are negative for acute change, except as noted in the HPI.    Physical Exam Updated Vital Signs BP (!) 129/93 (BP Location: Left Arm)   Pulse 70   Temp 98.3 F (36.8 C) (Oral)   Resp 18   Ht 5\' 10"  (1.778 m)   Wt 94.8 kg   SpO2 100%   BMI 29.99 kg/m   Physical Exam Vitals and  nursing note reviewed.  Constitutional:      General: He is not in acute distress.    Appearance: He is well-developed. He is not diaphoretic.     Comments: Nontoxic appearing and in NAD  HENT:     Head: Normocephalic and atraumatic.  Eyes:     General: No scleral icterus.    Conjunctiva/sclera: Conjunctivae normal.  Cardiovascular:     Rate and Rhythm: Normal rate and regular rhythm.     Pulses: Normal pulses.  Pulmonary:     Effort: Pulmonary effort is normal. No respiratory distress.     Breath sounds: No stridor. Wheezing (diffuse expiratory wheeze) present. No rhonchi.     Comments: Respirations even and unlabored Musculoskeletal:        General: Normal range of motion.     Cervical back: Normal range of motion.  Skin:    General: Skin is warm and dry.     Coloration: Skin is not pale.     Findings: No  erythema or rash.  Neurological:     Mental Status: He is alert and oriented to person, place, and time.     Coordination: Coordination normal.  Psychiatric:        Behavior: Behavior normal.    ED Results / Procedures / Treatments   Labs (all labs ordered are listed, but only abnormal results are displayed) Labs Reviewed - No data to display  EKG None  Radiology No results found.  Procedures Procedures   Medications Ordered in ED Medications  albuterol (VENTOLIN HFA) 108 (90 Base) MCG/ACT inhaler 2 puff (2 puffs Inhalation Given 09/21/20 2345)  ipratropium-albuterol (DUONEB) 0.5-2.5 (3) MG/3ML nebulizer solution (  Not Given 09/22/20 0043)  dexamethasone (DECADRON) injection 10 mg (10 mg Intramuscular Given 09/22/20 0100)  albuterol (PROVENTIL) (2.5 MG/3ML) 0.083% nebulizer solution 5 mg (5 mg Nebulization Given 09/22/20 0043)  ipratropium (ATROVENT) nebulizer solution 0.5 mg (0.5 mg Nebulization Given 09/22/20 0044)    ED Course  I have reviewed the triage vital signs and the nursing notes.  Pertinent labs & imaging results that were available during my care of the patient were reviewed by me and considered in my medical decision making (see chart for details).  Clinical Course as of 09/22/20 0156  Sat Sep 22, 2020  0132 Lungs clear on repeat auscultation. Patient reports symptoms have resolved. [KH]    Clinical Course User Index [KH] Darylene Price   MDM Rules/Calculators/A&P                           45 year old male presenting for asthma exacerbation.  Had wheezing diffusely on initial exam which improved following DuoNeb and Decadron.  Patient states that he is feeling better.  He is hemodynamically stable without hypoxia.  Lungs clear on repeat auscultation.  Plan for discharge on 5-day burst of prednisone as well as with refill for his albuterol inhaler.  Return precautions discussed and provided. Patient discharged in stable condition with no unaddressed  concerns.   Final Clinical Impression(s) / ED Diagnoses Final diagnoses:  Exacerbation of asthma, unspecified asthma severity, unspecified whether persistent    Rx / DC Orders ED Discharge Orders          Ordered    predniSONE (DELTASONE) 20 MG tablet  Daily        09/22/20 0133    albuterol (VENTOLIN HFA) 108 (90 Base) MCG/ACT inhaler  Every 6 hours PRN  09/22/20 0133             Antony Madura, PA-C 09/22/20 0157    Glynn Octave, MD 09/22/20 [phone]

## 2020-09-29 ENCOUNTER — Encounter (HOSPITAL_COMMUNITY): Payer: Self-pay

## 2020-09-29 ENCOUNTER — Emergency Department (HOSPITAL_COMMUNITY)
Admission: EM | Admit: 2020-09-29 | Discharge: 2020-09-29 | Disposition: A | Discharge status: Home / Self Care | Payer: 59 | Attending: Emergency Medicine | Admitting: Emergency Medicine

## 2020-09-29 ENCOUNTER — Other Ambulatory Visit: Payer: Self-pay

## 2020-09-29 DIAGNOSIS — R0602 Shortness of breath: Secondary | ICD-10-CM | POA: Diagnosis present

## 2020-09-29 DIAGNOSIS — F17210 Nicotine dependence, cigarettes, uncomplicated: Secondary | ICD-10-CM | POA: Insufficient documentation

## 2020-09-29 DIAGNOSIS — J4521 Mild intermittent asthma with (acute) exacerbation: Secondary | ICD-10-CM | POA: Diagnosis not present

## 2020-09-29 MED ORDER — PREDNISONE 20 MG PO TABS: ORAL_TABLET | ORAL | 0 refills | Status: DC

## 2020-09-29 MED ORDER — ALBUTEROL SULFATE HFA 108 (90 BASE) MCG/ACT IN AERS
6.0000 | INHALATION_SPRAY | RESPIRATORY_TRACT | Status: DC | PRN
Start: 2020-09-29 — End: 2020-09-29
  Administered 2020-09-29: 6 via RESPIRATORY_TRACT
  Filled 2020-09-29: qty 6.7

## 2020-09-29 MED ORDER — ALBUTEROL SULFATE HFA 108 (90 BASE) MCG/ACT IN AERS: 2.0000 | INHALATION_SPRAY | RESPIRATORY_TRACT | 2 refills | Status: DC | PRN

## 2020-09-29 NOTE — ED Triage Notes (Signed)
Pt reports having an asthma attack. Using albuterol inhaler more than normal.

## 2020-09-30 NOTE — ED Provider Notes (Signed)
[facility]-EMERGENCY DEPT Provider Note   CSN: [id number] Arrival date & time: 09/29/20  0308     History Chief Complaint  Patient presents with   Asthma    Keith Henry is a 45 y.o. male.  Patient presents to the emergency department for evaluation of shortness of breath.  Patient reports a history of asthma.  He has run out of his inhaler approximately a week ago.  His last inhaler was given here in the department.  He does not have a primary care doctor.  Patient reports wheezing and tightness in his lungs, mild cough.  Cough nonproductive.  No fever.  No chest pain.      Past Medical History:  Diagnosis Date   Asthma     There are no problems to display for this patient.   Past Surgical History:  Procedure Laterality Date   ANKLE SURGERY     SKIN GRAFT     SKULL FRACTURE ELEVATION     pt unsure of specifics       No family history on file.  Social History   Tobacco Use   Smoking status: Every Day    Packs/day: 2.00    Types: Cigarettes   Smokeless tobacco: Never  Vaping Use   Vaping Use: Never used  Substance Use Topics   Alcohol use: Yes    Alcohol/week: 2.0 standard drinks    Types: 2 Standard drinks or equivalent per week    Comment: occasion   Drug use: Yes    Types: Marijuana    Home Medications Prior to Admission medications   Medication Sig Start Date End Date Taking? Authorizing Provider  albuterol (VENTOLIN HFA) 108 (90 Base) MCG/ACT inhaler Inhale 2 puffs into the lungs every 4 (four) hours as needed for wheezing or shortness of breath. 09/29/20  Yes Johnnye Sandford, Canary Brim, MD  predniSONE (DELTASONE) 20 MG tablet 3 tabs po daily x 3 days, then 2 tabs x 3 days, then 1.5 tabs x 3 days, then 1 tab x 3 days, then 0.5 tabs x 3 days 09/29/20  Yes Henri Baumler, Canary Brim, MD  ibuprofen (ADVIL) 600 MG tablet Take 1 tablet (600 mg total) by mouth every 6 (six) hours as needed. 03/22/20   Joy, Shawn C, PA-C  lidocaine (LIDODERM)  5 % Place 1 patch onto the skin daily. Remove & Discard patch within 12 hours or as directed by MD 03/22/20   Joy, Hillard Danker, PA-C  methocarbamol (ROBAXIN) 750 MG tablet Take 1 tablet (750 mg total) by mouth 2 (two) times daily as needed for muscle spasms (or muscle tightness). 03/22/20   Joy, Shawn C, PA-C    Allergies    Other and Methylprednisolone sodium succ  Review of Systems   Review of Systems  Respiratory:  Positive for chest tightness and wheezing.   All other systems reviewed and are negative.  Physical Exam Updated Vital Signs BP 120/82 (BP Location: Right Arm)   Pulse 63   Temp (!) 97.5 F (36.4 C) (Oral)   Resp 17   Ht 5\' 10"  (1.778 m)   Wt 94.8 kg   SpO2 95%   BMI 29.99 kg/m   Physical Exam Vitals and nursing note reviewed.  Constitutional:      General: He is not in acute distress.    Appearance: Normal appearance. He is well-developed.  HENT:     Head: Normocephalic and atraumatic.     Right Ear: Hearing normal.     Left  Ear: Hearing normal.     Nose: Nose normal.  Eyes:     Conjunctiva/sclera: Conjunctivae normal.     Pupils: Pupils are equal, round, and reactive to light.  Cardiovascular:     Rate and Rhythm: Regular rhythm.     Heart sounds: S1 normal and S2 normal. No murmur heard.   No friction rub. No gallop.  Pulmonary:     Effort: Pulmonary effort is normal. No respiratory distress.     Breath sounds: Wheezing present.  Chest:     Chest wall: No tenderness.  Abdominal:     General: Bowel sounds are normal.     Palpations: Abdomen is soft.     Tenderness: There is no abdominal tenderness. There is no guarding or rebound. Negative signs include Murphy's sign and McBurney's sign.     Hernia: No hernia is present.  Musculoskeletal:        General: Normal range of motion.     Cervical back: Normal range of motion and neck supple.  Skin:    General: Skin is warm and dry.     Findings: No rash.  Neurological:     Mental Status: He is alert and  oriented to person, place, and time.     GCS: GCS eye subscore is 4. GCS verbal subscore is 5. GCS motor subscore is 6.     Cranial Nerves: No cranial nerve deficit.     Sensory: No sensory deficit.     Coordination: Coordination normal.  Psychiatric:        Speech: Speech normal.        Behavior: Behavior normal.        Thought Content: Thought content normal.    ED Results / Procedures / Treatments   Labs (all labs ordered are listed, but only abnormal results are displayed) Labs Reviewed - No data to display  EKG None  Radiology No results found.  Procedures Procedures   Medications Ordered in ED Medications - No data to display  ED Course  I have reviewed the triage vital signs and the nursing notes.  Pertinent labs & imaging results that were available during my care of the patient were reviewed by me and considered in my medical decision making (see chart for details).    MDM Rules/Calculators/A&P                           Patient with diffuse wheezing in both lung fields at arrival.  He is moving air fairly well and is not hypoxic.  Patient given an inhaler and after 6 puffs feels improved.  Wheezing is diminished.  Will discharge with inhaler.  Was given a prescription for tapered prednisone dose and additional albuterol inhalers.  Given return precautions.  Final Clinical Impression(s) / ED Diagnoses Final diagnoses:  Mild intermittent asthma with acute exacerbation    Rx / DC Orders ED Discharge Orders          Ordered    albuterol (VENTOLIN HFA) 108 (90 Base) MCG/ACT inhaler  Every 4 hours PRN        09/29/20 0732    predniSONE (DELTASONE) 20 MG tablet        09/29/20 0746             Gilda Crease, MD 09/30/20 [phone]

## 2020-10-24 ENCOUNTER — Other Ambulatory Visit: Payer: Self-pay

## 2020-10-24 ENCOUNTER — Emergency Department (HOSPITAL_COMMUNITY)
Admission: EM | Admit: 2020-10-24 | Discharge: 2020-10-25 | Disposition: A | Discharge status: Home / Self Care | Payer: 59 | Attending: Emergency Medicine | Admitting: Emergency Medicine

## 2020-10-24 ENCOUNTER — Emergency Department (HOSPITAL_COMMUNITY): Payer: 59

## 2020-10-24 ENCOUNTER — Encounter (HOSPITAL_COMMUNITY): Payer: Self-pay

## 2020-10-24 DIAGNOSIS — J4541 Moderate persistent asthma with (acute) exacerbation: Secondary | ICD-10-CM | POA: Insufficient documentation

## 2020-10-24 DIAGNOSIS — Z5321 Procedure and treatment not carried out due to patient leaving prior to being seen by health care provider: Secondary | ICD-10-CM | POA: Diagnosis not present

## 2020-10-24 DIAGNOSIS — R0600 Dyspnea, unspecified: Secondary | ICD-10-CM | POA: Insufficient documentation

## 2020-10-24 DIAGNOSIS — J45909 Unspecified asthma, uncomplicated: Secondary | ICD-10-CM | POA: Insufficient documentation

## 2020-10-24 DIAGNOSIS — Z760 Encounter for issue of repeat prescription: Secondary | ICD-10-CM | POA: Insufficient documentation

## 2020-10-24 DIAGNOSIS — F17200 Nicotine dependence, unspecified, uncomplicated: Secondary | ICD-10-CM | POA: Diagnosis not present

## 2020-10-24 DIAGNOSIS — Z20822 Contact with and (suspected) exposure to covid-19: Secondary | ICD-10-CM | POA: Insufficient documentation

## 2020-10-24 MED ORDER — ALBUTEROL SULFATE (2.5 MG/3ML) 0.083% IN NEBU
INHALATION_SOLUTION | RESPIRATORY_TRACT | Status: AC
  Filled 2020-10-24: qty 6
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
## 2020-10-25 ENCOUNTER — Other Ambulatory Visit (HOSPITAL_COMMUNITY): Payer: Self-pay

## 2020-10-25 ENCOUNTER — Other Ambulatory Visit: Payer: Self-pay

## 2020-10-25 ENCOUNTER — Emergency Department (HOSPITAL_COMMUNITY): Payer: 59

## 2020-10-25 ENCOUNTER — Encounter (HOSPITAL_COMMUNITY): Payer: Self-pay

## 2020-10-25 ENCOUNTER — Emergency Department (HOSPITAL_COMMUNITY)
Admission: EM | Admit: 2020-10-25 | Discharge: 2020-10-25 | Disposition: A | Discharge status: Home / Self Care | Payer: 59 | Source: Home / Self Care | Attending: Emergency Medicine | Admitting: Emergency Medicine

## 2020-10-25 DIAGNOSIS — Z20822 Contact with and (suspected) exposure to covid-19: Secondary | ICD-10-CM | POA: Insufficient documentation

## 2020-10-25 DIAGNOSIS — J4541 Moderate persistent asthma with (acute) exacerbation: Secondary | ICD-10-CM | POA: Insufficient documentation

## 2020-10-25 DIAGNOSIS — F17210 Nicotine dependence, cigarettes, uncomplicated: Secondary | ICD-10-CM | POA: Insufficient documentation

## 2020-10-25 DIAGNOSIS — Z789 Other specified health status: Secondary | ICD-10-CM

## 2020-10-25 LAB — RESP PANEL BY RT-PCR (FLU A&B, COVID) ARPGX2
Influenza A by PCR: NEGATIVE
Influenza B by PCR: NEGATIVE
SARS Coronavirus 2 by RT PCR: NEGATIVE

## 2020-10-25 LAB — BASIC METABOLIC PANEL
Anion gap: 7 (ref 5–15)
BUN: 13 mg/dL (ref 6–20)
CO2: 27 mmol/L (ref 22–32)
Calcium: 8.7 mg/dL — ABNORMAL LOW (ref 8.9–10.3)
Chloride: 106 mmol/L (ref 98–111)
Creatinine, Ser: 1.14 mg/dL (ref 0.61–1.24)
GFR, Estimated: >60 mL/min (ref >60)
Glucose, Bld: 109 mg/dL — ABNORMAL HIGH (ref 70–99)
Potassium: 4.2 mmol/L (ref 3.5–5.1)
Sodium: 140 mmol/L (ref 135–145)

## 2020-10-25 LAB — COMPREHENSIVE METABOLIC PANEL
ALT: 9 U/L (ref 0–44)
AST: 17 U/L (ref 15–41)
Albumin: 4 g/dL (ref 3.5–5.0)
Alkaline Phosphatase: 88 U/L (ref 38–126)
Anion gap: 9 (ref 5–15)
BUN: 11 mg/dL (ref 6–20)
CO2: 26 mmol/L (ref 22–32)
Calcium: 8.3 mg/dL — ABNORMAL LOW (ref 8.9–10.3)
Chloride: 102 mmol/L (ref 98–111)
Creatinine, Ser: 1.16 mg/dL (ref 0.61–1.24)
GFR, Estimated: >60 mL/min (ref >60)
Glucose, Bld: 114 mg/dL — ABNORMAL HIGH (ref 70–99)
Potassium: 3.7 mmol/L (ref 3.5–5.1)
Sodium: 137 mmol/L (ref 135–145)
Total Bilirubin: 1.1 mg/dL (ref 0.3–1.2)
Total Protein: 6.8 g/dL (ref 6.5–8.1)

## 2020-10-25 LAB — CBC
HCT: 45.1 % (ref 39.0–52.0)
Hemoglobin: 14.8 g/dL (ref 13.0–17.0)
MCH: 32.4 pg (ref 26.0–34.0)
MCHC: 32.8 g/dL (ref 30.0–36.0)
MCV: 98.7 fL (ref 80.0–100.0)
Platelets: 319 10*3/uL (ref 150–400)
RBC: 4.57 MIL/uL (ref 4.22–5.81)
RDW: 12.1 % (ref 11.5–15.5)
WBC: 9.3 10*3/uL (ref 4.0–10.5)
nRBC: 0 % (ref 0.0–0.2)

## 2020-10-25 LAB — CBC WITH DIFFERENTIAL/PLATELET
Abs Immature Granulocytes: 0.03 10*3/uL (ref 0.00–0.07)
Basophils Absolute: 0.2 10*3/uL — ABNORMAL HIGH (ref 0.0–0.1)
Basophils Relative: 2 %
Eosinophils Absolute: 0.6 10*3/uL — ABNORMAL HIGH (ref 0.0–0.5)
Eosinophils Relative: 6 %
HCT: 44.9 % (ref 39.0–52.0)
Hemoglobin: 15 g/dL (ref 13.0–17.0)
Immature Granulocytes: 0 %
Lymphocytes Relative: 34 %
Lymphs Abs: 3.3 10*3/uL (ref 0.7–4.0)
MCH: 32.4 pg (ref 26.0–34.0)
MCHC: 33.4 g/dL (ref 30.0–36.0)
MCV: 97 fL (ref 80.0–100.0)
Monocytes Absolute: 1 10*3/uL (ref 0.1–1.0)
Monocytes Relative: 10 %
Neutro Abs: 4.8 10*3/uL (ref 1.7–7.7)
Neutrophils Relative %: 48 %
Platelets: 326 10*3/uL (ref 150–400)
RBC: 4.63 MIL/uL (ref 4.22–5.81)
RDW: 12.1 % (ref 11.5–15.5)
WBC: 9.9 10*3/uL (ref 4.0–10.5)
nRBC: 0 % (ref 0.0–0.2)

## 2020-10-25 MED ORDER — PREDNISONE 20 MG PO TABS
60.0000 mg | ORAL_TABLET | Freq: Once | ORAL | Status: AC
  Administered 2020-10-25: 60 mg via ORAL
  Filled 2020-10-25: qty 3

## 2020-10-25 MED ORDER — ALBUTEROL SULFATE HFA 108 (90 BASE) MCG/ACT IN AERS
2.0000 | INHALATION_SPRAY | RESPIRATORY_TRACT | Status: DC | PRN
  Administered 2020-10-25: 2 via RESPIRATORY_TRACT
  Filled 2020-10-25: qty 6.7

## 2020-10-25 MED ORDER — SODIUM CHLORIDE 0.9 % IV BOLUS
250.0000 mL | Freq: Once | INTRAVENOUS | Status: AC
  Administered 2020-10-25: 250 mL via INTRAVENOUS

## 2020-10-25 MED ORDER — ALBUTEROL SULFATE (2.5 MG/3ML) 0.083% IN NEBU
5.0000 mg | INHALATION_SOLUTION | Freq: Once | RESPIRATORY_TRACT | Status: AC
  Administered 2020-10-25: 5 mg via RESPIRATORY_TRACT
  Filled 2020-10-25: qty 6

## 2020-10-25 MED ORDER — AEROCHAMBER Z-STAT PLUS/MEDIUM MISC
1.0000 | Freq: Once | Status: AC
  Administered 2020-10-25: 1
  Filled 2020-10-25: qty 1

## 2020-10-25 MED ORDER — PREDNISONE 10 MG PO TABS
20.0000 mg | ORAL_TABLET | Freq: Every day | ORAL | 0 refills | Status: DC
  Filled 2020-10-25: qty 10, 5d supply, fill #0

## 2020-10-25 MED ORDER — ALBUTEROL SULFATE (2.5 MG/3ML) 0.083% IN NEBU
10.0000 mg | INHALATION_SOLUTION | Freq: Once | RESPIRATORY_TRACT | Status: AC
  Administered 2020-10-25: 10 mg via RESPIRATORY_TRACT
  Filled 2020-10-25: qty 12

## 2020-10-25 NOTE — ED Provider Notes (Signed)
[facility]-EMERGENCY DEPT Provider Note   CSN: [id number] Arrival date & time: 10/25/20  1740     History Chief Complaint  Patient presents with   Asthma    Keith Henry is a 45 y.o. male.  HPI 45 yo male with history of asthma, out of inhaler for two weeks, with worsening dyspnea and wheezing.  Patient with cough with clear liquids.  Patient was smoker prior just prior to this episode.  He denies fever, chills, chest pain.  No history of covid infection or vaccine.  Cough productive of clear sputum.  Nasal discharge clear sputum     Past Medical History:  Diagnosis Date   Asthma     There are no problems to display for this patient.   Past Surgical History:  Procedure Laterality Date   ANKLE SURGERY     SKIN GRAFT     SKULL FRACTURE ELEVATION     pt unsure of specifics       History reviewed. No pertinent family history.  Social History   Tobacco Use   Smoking status: Every Day    Packs/day: 2.00    Types: Cigarettes   Smokeless tobacco: Never  Vaping Use   Vaping Use: Never used  Substance Use Topics   Alcohol use: Yes    Alcohol/week: 2.0 standard drinks    Types: 2 Standard drinks or equivalent per week    Comment: occasion   Drug use: Yes    Types: Marijuana    Home Medications Prior to Admission medications   Medication Sig Start Date End Date Taking? Authorizing Provider  predniSONE (DELTASONE) 10 MG tablet Take 2 tablets (20 mg total) by mouth daily. 10/25/20  Yes Margarita Grizzle, MD  albuterol (VENTOLIN HFA) 108 (90 Base) MCG/ACT inhaler Inhale 2 puffs into the lungs every 4 (four) hours as needed for wheezing or shortness of breath. 09/29/20   Pollina, Canary Brim, MD  ibuprofen (ADVIL) 600 MG tablet Take 1 tablet (600 mg total) by mouth every 6 (six) hours as needed. 03/22/20   Joy, Shawn C, PA-C  lidocaine (LIDODERM) 5 % Place 1 patch onto the skin daily. Remove & Discard patch within 12 hours or as directed by MD  03/22/20   Joy, Hillard Danker, PA-C  methocarbamol (ROBAXIN) 750 MG tablet Take 1 tablet (750 mg total) by mouth 2 (two) times daily as needed for muscle spasms (or muscle tightness). 03/22/20   Joy, Shawn C, PA-C    Allergies    Other and Methylprednisolone sodium succ  Review of Systems   Review of Systems  All other systems reviewed and are negative.  Physical Exam Updated Vital Signs BP 124/83   Pulse 74   Temp 98 F (36.7 C) (Oral)   Resp 17   Ht 1.778 m (5\' 10" )   Wt 95.3 kg   SpO2 96%   BMI 30.13 kg/m   Physical Exam Vitals and nursing note reviewed.  Constitutional:      Appearance: Normal appearance.  HENT:     Head: Normocephalic.     Right Ear: External ear normal.     Left Ear: External ear normal.     Nose: Nose normal.     Mouth/Throat:     Pharynx: Oropharynx is clear.  Eyes:     Extraocular Movements: Extraocular movements intact.     Pupils: Pupils are equal, round, and reactive to light.  Cardiovascular:     Rate and Rhythm: Normal rate and regular  rhythm.     Pulses: Normal pulses.  Pulmonary:     Effort: Pulmonary effort is normal.     Breath sounds: Normal breath sounds.  Abdominal:     General: Abdomen is flat.     Palpations: Abdomen is soft.  Musculoskeletal:        General: Normal range of motion.     Cervical back: Normal range of motion and neck supple.  Skin:    General: Skin is warm and dry.     Capillary Refill: Capillary refill takes less than 2 seconds.  Neurological:     General: No focal deficit present.     Mental Status: He is alert.  Psychiatric:        Mood and Affect: Mood normal.        Behavior: Behavior normal.    ED Results / Procedures / Treatments   Labs (all labs ordered are listed, but only abnormal results are displayed) Labs Reviewed  BASIC METABOLIC PANEL - Abnormal; Notable for the following components:      Result Value   Glucose, Bld 109 (*)    Calcium 8.7 (*)    All other components within normal limits   RESP PANEL BY RT-PCR (FLU A&B, COVID) ARPGX2  CBC    EKG EKG Interpretation  Date/Time:  Thursday October 25 2020 09:56:27 EDT Ventricular Rate:  85 PR Interval:  161 QRS Duration: 86 QT Interval:  378 QTC Calculation: 450 R Axis:   79 Text Interpretation: Sinus rhythm RSR' in V1 or V2, probably normal variant Confirmed by Margarita Grizzle [phone]) on 10/25/2020 10:01:27 AM  Radiology DG Chest 2 View  Result Date: 10/25/2020 CLINICAL DATA:  Asthma flare up. EXAM: CHEST - 2 VIEW COMPARISON:  September 01, 2020 FINDINGS: The heart size and mediastinal contours are within normal limits. Both lungs are clear. A chronic fracture is seen involving the mid right clavicle. IMPRESSION: No active cardiopulmonary disease. Electronically Signed   By: Aram Candela M.D.   On: 10/25/2020 00:17    Procedures Procedures   Medications Ordered in ED Medications  albuterol (VENTOLIN HFA) 108 (90 Base) MCG/ACT inhaler 2 puff (has no administration in time range)  aerochamber Z-Stat Plus/medium 1 each (has no administration in time range)  albuterol (PROVENTIL) (2.5 MG/3ML) 0.083% nebulizer solution 10 mg (10 mg Nebulization Given 10/25/20 1008)  predniSONE (DELTASONE) tablet 60 mg (60 mg Oral Given 10/25/20 1009)  sodium chloride 0.9 % bolus 250 mL (0 mLs Intravenous Stopped 10/25/20 1115)  albuterol (PROVENTIL) (2.5 MG/3ML) 0.083% nebulizer solution 5 mg (5 mg Nebulization Given 10/25/20 1120)    ED Course  I have reviewed the triage vital signs and the nursing notes.  Pertinent labs & imaging results that were available during my care of the patient were reviewed by me and considered in my medical decision making (see chart for details).  Clinical Course as of 10/25/20 1233  Thu Oct 25, 2020  1057 Patient received albuterol 10 mg via nebulizer and 60 mg of prednisone.Marland Kitchen  He feels improved but can still feels like he is wheezing.  Lungs have increased air movement with coarse bilateral expiratory  wheezes. [DR]    Clinical Course User Index [DR] Margarita Grizzle, MD  12:33 PM Lungs clear patient with normal oxygen saturations and respiratory rate. MDM Rules/Calculators/A&P                           45 year old male history of asthma  has been out of his inhaler for several weeks.  Presents today with asthma exacerbation.  He received 15 mg of albuterol and 60 mg of prednisone.  His wheezing has resolved.  He feels improved.  We discussed need for follow-up and return precautions and he voiced understanding.  Plan albuterol HFA with spacer from here.  Prednisone for 5 days. Final Clinical Impression(s) / ED Diagnoses Final diagnoses:  Moderate persistent asthma with exacerbation  Has run out of medications    Rx / DC Orders ED Discharge Orders          Ordered    predniSONE (DELTASONE) 10 MG tablet  Daily        10/25/20 1233             Margarita Grizzle, MD 10/25/20 1233

## 2020-10-25 NOTE — ED Notes (Signed)
Pt refused to have vital signs assessed. Pt states he will allow it when he gets to a treatment room.

## 2020-10-25 NOTE — ED Notes (Signed)
Pt resting quietly, no longer audible expiratory wheezing.
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
## 2020-11-02 ENCOUNTER — Other Ambulatory Visit (HOSPITAL_COMMUNITY): Payer: Self-pay

## 2020-11-14 ENCOUNTER — Emergency Department (HOSPITAL_COMMUNITY)
Admission: EM | Admit: 2020-11-14 | Discharge: 2020-11-15 | Disposition: A | Discharge status: Home / Self Care | Payer: Self-pay | Attending: Emergency Medicine | Admitting: Emergency Medicine

## 2020-11-14 ENCOUNTER — Encounter (HOSPITAL_COMMUNITY): Payer: Self-pay | Admitting: Emergency Medicine

## 2020-11-14 DIAGNOSIS — F17210 Nicotine dependence, cigarettes, uncomplicated: Secondary | ICD-10-CM | POA: Insufficient documentation

## 2020-11-14 DIAGNOSIS — J4541 Moderate persistent asthma with (acute) exacerbation: Secondary | ICD-10-CM | POA: Insufficient documentation

## 2020-11-14 MED ORDER — ALBUTEROL SULFATE HFA 108 (90 BASE) MCG/ACT IN AERS: 2.0000 | INHALATION_SPRAY | RESPIRATORY_TRACT | Status: DC | PRN

## 2020-11-14 NOTE — ED Triage Notes (Signed)
Pt arrives via POV from home with asthma attack that began 2 days ago. Denies recent fever, reports cough and wheezing. No relief with home inhalers. Reports SOB. VSS.

## 2020-11-15 ENCOUNTER — Emergency Department (HOSPITAL_COMMUNITY): Payer: Self-pay

## 2020-11-15 MED ORDER — PREDNISONE 50 MG PO TABS: ORAL_TABLET | ORAL | 0 refills | Status: DC

## 2020-11-15 MED ORDER — ALBUTEROL SULFATE (2.5 MG/3ML) 0.083% IN NEBU
5.0000 mg | INHALATION_SOLUTION | Freq: Once | RESPIRATORY_TRACT | Status: AC
  Administered 2020-11-15: 5 mg via RESPIRATORY_TRACT
  Filled 2020-11-15: qty 6

## 2020-11-15 MED ORDER — IPRATROPIUM BROMIDE 0.02 % IN SOLN
0.5000 mg | Freq: Once | RESPIRATORY_TRACT | Status: AC
  Administered 2020-11-15: 0.5 mg via RESPIRATORY_TRACT
  Filled 2020-11-15: qty 2.5

## 2020-11-15 MED ORDER — ALBUTEROL SULFATE HFA 108 (90 BASE) MCG/ACT IN AERS: 1.0000 | INHALATION_SPRAY | Freq: Four times a day (QID) | RESPIRATORY_TRACT | 0 refills | Status: DC | PRN

## 2020-11-15 MED ORDER — PREDNISONE 20 MG PO TABS
60.0000 mg | ORAL_TABLET | Freq: Once | ORAL | Status: AC
  Administered 2020-11-15: 60 mg via ORAL
  Filled 2020-11-15: qty 3

## 2020-11-15 NOTE — ED Provider Notes (Signed)
[facility] EMERGENCY DEPARTMENT Provider Note   CSN: [id number] Arrival date & time: 11/14/20  2300     History Chief Complaint  Patient presents with   Asthma   Cough    Keith Henry is a 45 y.o. male.  The history is provided by the patient.  Asthma This is a new problem. The current episode started 2 days ago. The problem occurs daily. The problem has been gradually worsening. Associated symptoms include chest pain. Pertinent negatives include no abdominal pain. Nothing aggravates the symptoms. Nothing relieves the symptoms.  Cough Associated symptoms: chest pain   Associated symptoms: no fever   Patient with history of asthma presents with asthma exacerbation.  Reports cough, wheezing and chest tightness.  No improvement with home medication     Past Medical History:  Diagnosis Date   Asthma     There are no problems to display for this patient.   Past Surgical History:  Procedure Laterality Date   ANKLE SURGERY     SKIN GRAFT     SKULL FRACTURE ELEVATION     pt unsure of specifics       History reviewed. No pertinent family history.  Social History   Tobacco Use   Smoking status: Every Day    Packs/day: 2.00    Types: Cigarettes   Smokeless tobacco: Never  Vaping Use   Vaping Use: Never used  Substance Use Topics   Alcohol use: Yes    Alcohol/week: 2.0 standard drinks    Types: 2 Standard drinks or equivalent per week    Comment: occasion   Drug use: Yes    Types: Marijuana    Home Medications Prior to Admission medications   Medication Sig Start Date End Date Taking? Authorizing Provider  albuterol (VENTOLIN HFA) 108 (90 Base) MCG/ACT inhaler Inhale 1-2 puffs into the lungs every 6 (six) hours as needed for wheezing or shortness of breath. 11/15/20  Yes Zadie Rhine, MD  predniSONE (DELTASONE) 50 MG tablet 1 tablet PO QD X4 days 11/15/20  Yes Zadie Rhine, MD  albuterol (VENTOLIN HFA) 108 (90 Base) MCG/ACT  inhaler Inhale 2 puffs into the lungs every 4 (four) hours as needed for wheezing or shortness of breath. 09/29/20   Pollina, Canary Brim, MD    Allergies    Other and Methylprednisolone sodium succ  Review of Systems   Review of Systems  Constitutional:  Negative for fever.  Respiratory:  Positive for cough.   Cardiovascular:  Positive for chest pain.  Gastrointestinal:  Negative for abdominal pain.  All other systems reviewed and are negative.  Physical Exam Updated Vital Signs BP (!) 103/59   Pulse 65   Temp 98.1 F (36.7 C) (Oral)   Resp 16   Ht 1.778 m (5\' 10" )   Wt 94.8 kg   SpO2 93%   BMI 29.99 kg/m   Physical Exam CONSTITUTIONAL: Well developed/well nourished HEAD: Normocephalic/atraumatic EYES: EOMI NECK: supple no meningeal signs SPINE/BACK:entire spine nontender CV: S1/S2 noted, no murmurs/rubs/gallops noted LUNGS: Wheezing bilaterally, no acute distress ABDOMEN: soft, nontender, no rebound or guarding, bowel sounds noted throughout abdomen GU:no cva tenderness NEURO: Pt is awake/alert/appropriate, moves all extremitiesx4.  No facial droop.   EXTREMITIES: pulses normal/equal, full ROM SKIN: warm, color normal PSYCH: no abnormalities of mood noted, alert and oriented to situation  ED Results / Procedures / Treatments   Labs (all labs ordered are listed, but only abnormal results are displayed) Labs Reviewed - No data to  display  EKG EKG Interpretation  Date/Time:  Wednesday November 14 2020 23:58:55 EDT Ventricular Rate:  93 PR Interval:  138 QRS Duration: 82 QT Interval:  344 QTC Calculation: 427 R Axis:   61 Text Interpretation: Normal sinus rhythm Normal ECG Confirmed by Marianna Fuss ([id number]) on 11/15/2020 12:03:26 AM  Radiology DG Chest 2 View  Result Date: 11/15/2020 CLINICAL DATA:  Cough and wheezing. EXAM: CHEST - 2 VIEW COMPARISON:  October 25, 2020 FINDINGS: Decreased lung volumes are seen. There is no evidence of acute  infiltrate, pleural effusion or pneumothorax. The heart size and mediastinal contours are within normal limits. A chronic fracture deformity is seen involving the mid right clavicle. IMPRESSION: Stable exam without acute cardiopulmonary disease. Electronically Signed   By: Aram Candela M.D.   On: 11/15/2020 00:41    Procedures Procedures   Medications Ordered in ED Medications  albuterol (VENTOLIN HFA) 108 (90 Base) MCG/ACT inhaler 2 puff (has no administration in time range)  albuterol (PROVENTIL) (2.5 MG/3ML) 0.083% nebulizer solution 5 mg (5 mg Nebulization Given 11/15/20 0013)  ipratropium (ATROVENT) nebulizer solution 0.5 mg (0.5 mg Nebulization Given 11/15/20 0014)  albuterol (PROVENTIL) (2.5 MG/3ML) 0.083% nebulizer solution 5 mg (5 mg Nebulization Given 11/15/20 0513)  predniSONE (DELTASONE) tablet 60 mg (60 mg Oral Given 11/15/20 2376)    ED Course  I have reviewed the triage vital signs and the nursing notes.  Pertinent  imaging results that were available during my care of the patient were reviewed by me and considered in my medical decision making (see chart for details).    MDM Rules/Calculators/A&P                           Patient presents for 15th ER visit in 6 months.  Patient presents typically for asthma exacerbations. On reassessment he is resting comfortably, no acute distress.  Will prescribe prednisone for 4 days, and albuterol MDI Final Clinical Impression(s) / ED Diagnoses Final diagnoses:  Moderate persistent asthma with exacerbation    Rx / DC Orders ED Discharge Orders          Ordered    albuterol (VENTOLIN HFA) 108 (90 Base) MCG/ACT inhaler  Every 6 hours PRN        11/15/20 0642    predniSONE (DELTASONE) 50 MG tablet        11/15/20 2831             Zadie Rhine, MD 11/15/20 [phone]

## 2020-11-15 NOTE — ED Notes (Signed)
Patient is resting comfortably on stretcher. No acute changes noted. Will continue to monitor.  

## 2020-11-15 NOTE — ED Notes (Signed)
ED Provider at bedside. 

## 2020-11-15 NOTE — ED Provider Notes (Signed)
Emergency Medicine Provider Triage Evaluation Note  Keith Henry , a 45 y.o. male  was evaluated in triage.  Pt complains of issues with asthma.  States bad allergies as well and has issues this time of year.  Has been using inhaler without relief.  Has nebulizer but out of solution.  Used inhaler 6-8 times before coming in without change.  Review of Systems  Positive: Wheezing Negative: Chest pain  Physical Exam  BP (!) 183/151 (BP Location: Left Arm)   Pulse 95   Temp 99.3 F (37.4 C) (Oral)   Resp (!) 21   Ht 5\' 10"  (1.778 m)   Wt 94.8 kg   SpO2 98%   BMI 29.99 kg/m  Gen:   Awake, no distress   Resp:  Increased WOB, audible wheezing from bedside MSK:   Moves extremities without difficulty  Other:    Medical Decision Making  Medically screening exam initiated at 12:04 AM.  Appropriate orders placed.  was informed that the remainder of the evaluation will be completed by another provider, this initial triage assessment does not replace that evaluation, and the importance of remaining in the ED until their evaluation is complete.  Audible wheezing from bedside.  No relief with inhaler.  Will start neb treatment and obtain CXR.   [name], PA-C 11/15/20 0043    11/17/20, DO 11/15/20 2339

## 2020-11-15 NOTE — ED Notes (Signed)
Pt given sandwich bag and water per request. No acute changes noted. Will continue to monitor.  

## 2020-11-22 ENCOUNTER — Other Ambulatory Visit: Payer: Self-pay

## 2020-11-22 ENCOUNTER — Encounter (HOSPITAL_COMMUNITY): Payer: Self-pay | Admitting: Emergency Medicine

## 2020-11-22 ENCOUNTER — Emergency Department (HOSPITAL_COMMUNITY): Payer: 59

## 2020-11-22 ENCOUNTER — Emergency Department (HOSPITAL_COMMUNITY)
Admission: EM | Admit: 2020-11-22 | Discharge: 2020-11-22 | Disposition: A | Discharge status: Home / Self Care | Payer: 59 | Attending: Emergency Medicine | Admitting: Emergency Medicine

## 2020-11-22 DIAGNOSIS — F17210 Nicotine dependence, cigarettes, uncomplicated: Secondary | ICD-10-CM | POA: Insufficient documentation

## 2020-11-22 DIAGNOSIS — R0602 Shortness of breath: Secondary | ICD-10-CM | POA: Diagnosis present

## 2020-11-22 DIAGNOSIS — J45901 Unspecified asthma with (acute) exacerbation: Secondary | ICD-10-CM | POA: Diagnosis not present

## 2020-11-22 MED ORDER — PREDNISONE 50 MG PO TABS
ORAL_TABLET | ORAL | 0 refills | Status: DC
Start: 2020-11-22 — End: 2020-12-29

## 2020-11-22 MED ORDER — IPRATROPIUM BROMIDE 0.02 % IN SOLN
0.5000 mg | Freq: Once | RESPIRATORY_TRACT | Status: AC
  Administered 2020-11-22: 0.5 mg via RESPIRATORY_TRACT
  Filled 2020-11-22: qty 2.5

## 2020-11-22 MED ORDER — ALBUTEROL SULFATE HFA 108 (90 BASE) MCG/ACT IN AERS: 1.0000 | INHALATION_SPRAY | Freq: Four times a day (QID) | RESPIRATORY_TRACT | 0 refills | Status: DC | PRN

## 2020-11-22 MED ORDER — ALBUTEROL SULFATE HFA 108 (90 BASE) MCG/ACT IN AERS
2.0000 | INHALATION_SPRAY | Freq: Four times a day (QID) | RESPIRATORY_TRACT | Status: DC | PRN
Start: 2020-11-22 — End: 2020-11-22
  Administered 2020-11-22: 2 via RESPIRATORY_TRACT
  Filled 2020-11-22: qty 6.7

## 2020-11-22 MED ORDER — PREDNISONE 20 MG PO TABS
60.0000 mg | ORAL_TABLET | Freq: Once | ORAL | Status: AC
  Administered 2020-11-22: 60 mg via ORAL
  Filled 2020-11-22: qty 3

## 2020-11-22 MED ORDER — ALBUTEROL SULFATE (2.5 MG/3ML) 0.083% IN NEBU
5.0000 mg | INHALATION_SOLUTION | RESPIRATORY_TRACT | Status: DC | PRN
  Administered 2020-11-22 (×2): 5 mg via RESPIRATORY_TRACT
  Filled 2020-11-22: qty 6

## 2020-11-22 NOTE — ED Notes (Signed)
Pt refused covid test

## 2020-11-22 NOTE — ED Triage Notes (Signed)
Pt states he started having an asthma attack two days ago. Pt is out of his inhalers. Pt is able to talk in complete sentences.

## 2020-11-22 NOTE — ED Provider Notes (Signed)
Emergency Medicine Provider Triage Evaluation Note  Keith Henry , a 45 y.o. male  was evaluated in triage.  Pt complains of shortness of breath x1 day.  Patient says he has been out of his rescue inhaler for the last 2 days, he is coughing up mucopurulent material for the last day and a half.  Denies being COVID vaccinated, denies smoking cigarettes.  Review of Systems  Positive: Shortness of breath, wheezing, cough Negative: Chest pain  Physical Exam  BP 133/82 (BP Location: Right Arm)   Pulse 71   Temp 98.2 F (36.8 C) (Oral)   Resp 16   SpO2 100%  Gen:   Awake, no distress   Resp:  Normal effort, inspiratory and expiratory wheezing in all lung fields.  Patient is not tachypneic or in respiratory distress. MSK:   Moves extremities without difficulty  Other:    Medical Decision Making  Medically screening exam initiated at 12:17 PM.  Appropriate orders placed.  [name] was informed that the remainder of the evaluation will be completed by another provider, this initial triage assessment does not replace that evaluation, and the importance of remaining in the ED until their evaluation is complete.  Asthma exacerbation, will COVID test.  Patient currently stable and not in respiratory distress.   Theron Arista, PA-C 11/22/20 1218    Sloan Leiter, DO 11/23/20 [phone]

## 2020-11-22 NOTE — ED Provider Notes (Signed)
[facility] EMERGENCY DEPARTMENT Provider Note   CSN: [id number] Arrival date & time: 11/22/20  1210     History Chief Complaint  Patient presents with   Asthma    Keith Henry is a 45 y.o. male.   Asthma   Patient presents to the ED for evaluation of shortness of breath.  Patient states he ran out of his asthma inhaler.  For the last couple days he has been having trouble with wheezing cough and congestion.  He denies any trouble with fevers or chills.  No chest pain or shortness of breath.  He has been bringing up mucus when he coughs.  His symptoms do get worse when he coughs and is active  Past Medical History:  Diagnosis Date   Asthma     There are no problems to display for this patient.   Past Surgical History:  Procedure Laterality Date   ANKLE SURGERY     SKIN GRAFT     SKULL FRACTURE ELEVATION     pt unsure of specifics       No family history on file.  Social History   Tobacco Use   Smoking status: Every Day    Packs/day: 2.00    Types: Cigarettes   Smokeless tobacco: Never  Vaping Use   Vaping Use: Never used  Substance Use Topics   Alcohol use: Yes    Alcohol/week: 2.0 standard drinks    Types: 2 Standard drinks or equivalent per week    Comment: occasion   Drug use: Yes    Types: Marijuana    Home Medications Prior to Admission medications   Medication Sig Start Date End Date Taking? Authorizing Provider  albuterol (VENTOLIN HFA) 108 (90 Base) MCG/ACT inhaler Inhale 2 puffs into the lungs every 4 (four) hours as needed for wheezing or shortness of breath. 09/29/20   Pollina, Canary Brim, MD  albuterol (VENTOLIN HFA) 108 (90 Base) MCG/ACT inhaler Inhale 1-2 puffs into the lungs every 6 (six) hours as needed for wheezing or shortness of breath. 11/22/20   Linwood Dibbles, MD  predniSONE (DELTASONE) 50 MG tablet 1 tablet PO QD X4 days 11/22/20   Linwood Dibbles, MD    Allergies    Other and Methylprednisolone sodium  succ  Review of Systems   Review of Systems  All other systems reviewed and are negative.  Physical Exam Updated Vital Signs BP 135/74   Pulse 65   Temp 98.2 F (36.8 C) (Oral)   Resp 20   SpO2 100%   Physical Exam Vitals and nursing note reviewed.  Constitutional:      General: He is not in acute distress.    Appearance: He is well-developed.  HENT:     Head: Normocephalic and atraumatic.     Right Ear: External ear normal.     Left Ear: External ear normal.  Eyes:     General: No scleral icterus.       Right eye: No discharge.        Left eye: No discharge.     Conjunctiva/sclera: Conjunctivae normal.  Neck:     Trachea: No tracheal deviation.  Cardiovascular:     Rate and Rhythm: Normal rate and regular rhythm.  Pulmonary:     Effort: No respiratory distress.     Breath sounds: No stridor. Wheezing present. No rales.     Comments: Able to speak in full sentences Abdominal:     General: Bowel sounds are normal. There is  no distension.     Palpations: Abdomen is soft.     Tenderness: There is no abdominal tenderness. There is no guarding or rebound.  Musculoskeletal:        General: No tenderness or deformity.     Cervical back: Neck supple.  Skin:    General: Skin is warm and dry.     Findings: No rash.  Neurological:     General: No focal deficit present.     Mental Status: He is alert.     Cranial Nerves: No cranial nerve deficit (no facial droop, extraocular movements intact, no slurred speech).     Sensory: No sensory deficit.     Motor: No abnormal muscle tone or seizure activity.     Coordination: Coordination normal.  Psychiatric:        Mood and Affect: Mood normal.    ED Results / Procedures / Treatments   Labs (all labs ordered are listed, but only abnormal results are displayed) Labs Reviewed - No data to display   EKG None  Radiology DG Chest 2 View  Result Date: 11/22/2020 CLINICAL DATA:  Cough, shortness of breath. EXAM: CHEST - 2  VIEW COMPARISON:  Chest x-ray dated November 15, 2020. FINDINGS: The heart size and mediastinal contours are within normal limits. Normal pulmonary vascularity. Chronic, mildly coarsened interstitial markings and central peribronchial thickening, likely smoking-related. No focal consolidation, pleural effusion, or pneumothorax. No acute osseous abnormality. Old healed right mid clavicle fracture again noted. IMPRESSION: 1. No acute cardiopulmonary disease. Electronically Signed   By: Obie Dredge M.D.   On: 11/22/2020 13:01    Procedures Procedures   Medications Ordered in ED Medications  albuterol (PROVENTIL) (2.5 MG/3ML) 0.083% nebulizer solution 5 mg (5 mg Nebulization Given 11/22/20 1447)  albuterol (VENTOLIN HFA) 108 (90 Base) MCG/ACT inhaler 2 puff (2 puffs Inhalation Given 11/22/20 1446)  predniSONE (DELTASONE) tablet 60 mg (60 mg Oral Given 11/22/20 1345)  ipratropium (ATROVENT) nebulizer solution 0.5 mg (0.5 mg Nebulization Given 11/22/20 1445)    ED Course  I have reviewed the triage vital signs and the nursing notes.  Pertinent labs & imaging results that were available during my care of the patient were reviewed by me and considered in my medical decision making (see chart for details).  Clinical Course as of 11/22/20 1516  Thu Nov 22, 2020  1440 Chest x-ray without acute findings [JK]  1443 Patient refused COVID testing [JK]    Clinical Course User Index [JK] Linwood Dibbles, MD   MDM Rules/Calculators/A&P                           Pt presents with acute asthma exacerbation.  No signs of pna.  No oxygen requirement.  Pt refused covid testing.  Treated with albuter atrovent steroids.  Sx improved.  Will plan on dc home.  Alb inhaler provided Final Clinical Impression(s) / ED Diagnoses Final diagnoses:  Exacerbation of asthma, unspecified asthma severity, unspecified whether persistent    Rx / DC Orders ED Discharge Orders          Ordered    albuterol (VENTOLIN HFA)  108 (90 Base) MCG/ACT inhaler  Every 6 hours PRN        11/22/20 1442    predniSONE (DELTASONE) 50 MG tablet        11/22/20 1442             Linwood Dibbles, MD 11/22/20 1516

## 2020-11-22 NOTE — Discharge Instructions (Signed)
Make sure to continue your medications at home.  Follow-up with a primary care doctor to make sure you are symptoms improve

## 2020-11-29 ENCOUNTER — Emergency Department (HOSPITAL_COMMUNITY)
Admission: EM | Admit: 2020-11-29 | Discharge: 2020-11-30 | Disposition: A | Discharge status: Home / Self Care | Payer: 59 | Attending: Emergency Medicine | Admitting: Emergency Medicine

## 2020-11-29 DIAGNOSIS — J45909 Unspecified asthma, uncomplicated: Secondary | ICD-10-CM | POA: Insufficient documentation

## 2020-11-29 DIAGNOSIS — K0889 Other specified disorders of teeth and supporting structures: Secondary | ICD-10-CM

## 2020-11-29 DIAGNOSIS — F17210 Nicotine dependence, cigarettes, uncomplicated: Secondary | ICD-10-CM | POA: Diagnosis not present

## 2020-11-29 NOTE — ED Triage Notes (Signed)
Pt here POV with c/o of right side dental pain. Pt states he has an abscess. 10/10 pain. Unable to see dentist.

## 2020-11-29 NOTE — ED Notes (Signed)
Pt did not respond when called for vitals check X3  

## 2020-11-29 NOTE — ED Notes (Signed)
Pt returned to lobby but refused vitals

## 2020-11-30 MED ORDER — AMOXICILLIN-POT CLAVULANATE 875-125 MG PO TABS: 1.0000 | ORAL_TABLET | Freq: Two times a day (BID) | ORAL | 0 refills | Status: AC

## 2020-11-30 MED ORDER — OXYCODONE HCL 5 MG PO TABS
5.0000 mg | ORAL_TABLET | Freq: Once | ORAL | Status: AC
  Administered 2020-11-30: 5 mg via ORAL
  Filled 2020-11-30: qty 1

## 2020-11-30 MED ORDER — AMOXICILLIN-POT CLAVULANATE 875-125 MG PO TABS
1.0000 | ORAL_TABLET | Freq: Once | ORAL | Status: AC
  Administered 2020-11-30: 1 via ORAL
  Filled 2020-11-30: qty 1

## 2020-11-30 NOTE — ED Provider Notes (Signed)
[facility]EMERGENCY DEPT [facility] Emergency Department Provider Note MRN:  [id number]  Arrival date & time: 11/30/20     Chief Complaint   Dental Pain   History of Present Illness   Keith Henry is a 45 y.o. year-old male with no pertinent past medical history presenting to the ED with chief complaint of dental pain.  Location: Right lower molar Duration: A long time but worse over the past 2 or 3 days Onset: Gradual Timing: Constant Description: Ache Severity: Moderate Exacerbating/Alleviating Factors: Worse with palpation Associated Symptoms: None Pertinent Negatives: No fever, no other complaints    Review of Systems  A problem-focused ROS was performed. Positive for tooth pain.  Patient denies fever.  Patient's Health History    Past Medical History:  Diagnosis Date   Asthma     Past Surgical History:  Procedure Laterality Date   ANKLE SURGERY     SKIN GRAFT     SKULL FRACTURE ELEVATION     pt unsure of specifics    No family history on file.  Social History   Socioeconomic History   Marital status: Single    Spouse name: Not on file   Number of children: Not on file   Years of education: Not on file   Highest education level: Not on file  Occupational History   Not on file  Tobacco Use   Smoking status: Every Day    Packs/day: 2.00    Types: Cigarettes   Smokeless tobacco: Never  Vaping Use   Vaping Use: Never used  Substance and Sexual Activity   Alcohol use: Yes    Alcohol/week: 2.0 standard drinks    Types: 2 Standard drinks or equivalent per week    Comment: occasion   Drug use: Yes    Types: Marijuana   Sexual activity: Not on file  Other Topics Concern   Not on file  Social History Narrative   ** Merged History Encounter **       Social Determinants of Health   Financial Resource Strain: Not on file  Food Insecurity: Not on file  Transportation Needs: Not on file  Physical Activity: Not on file  Stress: Not on file   Social Connections: Not on file  Intimate Partner Violence: Not on file     Physical Exam   Vitals:   11/29/20 1724 11/30/20 0129  BP: (!) 144/93 (!) 177/99  Pulse: 82 73  Resp: 18 17  Temp: 98.6 F (37 C)   SpO2: 98% 97%    CONSTITUTIONAL: Well-appearing, NAD NEURO:  Alert and oriented x 3, no focal deficits EYES:  eyes equal and reactive ENT/NECK:  no LAD, no JVD CARDIO: Regular rate, well-perfused, normal S1 and S2 PULM:  CTAB no wheezing or rhonchi GI/GU:  normal bowel sounds, non-distended, non-tender MSK/SPINE:  No gross deformities, no edema SKIN:  no rash, atraumatic PSYCH:  Appropriate speech and behavior  *Additional and/or pertinent findings included in MDM below  Diagnostic and Interventional Summary    EKG Interpretation  Date/Time:    Ventricular Rate:    PR Interval:    QRS Duration:   QT Interval:    QTC Calculation:   R Axis:     Text Interpretation:         Labs Reviewed - No data to display  No orders to display    Medications  oxyCODONE (Oxy IR/ROXICODONE) immediate release tablet 5 mg (5 mg Oral Given 11/30/20 0130)  amoxicillin-clavulanate (AUGMENTIN) 875-125 MG per tablet  1 tablet (1 tablet Oral Given 11/30/20 0130)     Procedures  /  Critical Care Procedures  ED Course and Medical Decision Making  I have reviewed the triage vital signs, the nursing notes, and pertinent available records from the EMR.  Listed above are laboratory and imaging tests that I personally ordered, reviewed, and interpreted and then considered in my medical decision making (see below for details).  Tender right mandibular molar, some white discoloration of the surrounding gingiva but no fluctuance, no obvious abscess.  No systemic symptoms, providing antibiotics, advise dental follow-up.       Keith Sow. Pilar Plate, MD [facility] Emergency Medicine [facility] [email or web address]  Final Clinical Impressions(s) / ED Diagnoses      ICD-10-CM   1. Pain, dental  K08.89       ED Discharge Orders          Ordered    amoxicillin-clavulanate (AUGMENTIN) 875-125 MG tablet  Every 12 hours        11/30/20 0136             Discharge Instructions Discussed with and Provided to Patient:    Discharge Instructions      You were evaluated in the Emergency Department and after careful evaluation, we did not find any emergent condition requiring admission or further testing in the hospital.  Your exam/testing today is overall reassuring.  Symptoms seem to be due to a dental infection.  Take the Augmentin antibiotic as directed and follow-up closely with a dentist.  Please return to the Emergency Department if you experience any worsening of your condition.   Thank you for allowing [place] to be a part of your care.       Sabas Sous, MD 11/30/20 [phone]

## 2020-11-30 NOTE — Discharge Instructions (Addendum)
You were evaluated in the Emergency Department and after careful evaluation, we did not find any emergent condition requiring admission or further testing in the hospital.  Your exam/testing today is overall reassuring.  Symptoms seem to be due to a dental infection.  Take the Augmentin antibiotic as directed and follow-up closely with a dentist.  Please return to the Emergency Department if you experience any worsening of your condition.   Thank you for allowing [place] to be a part of your care.

## 2020-12-01 ENCOUNTER — Telehealth: Payer: Self-pay

## 2020-12-01 NOTE — Telephone Encounter (Signed)
Patient called in to state the pharmacy did not get the prescription from the ED. Called Augmentin in as written to preferred pharmacy

## 2020-12-05 ENCOUNTER — Encounter (HOSPITAL_COMMUNITY): Payer: Self-pay | Admitting: *Deleted

## 2020-12-05 ENCOUNTER — Other Ambulatory Visit: Payer: Self-pay

## 2020-12-05 ENCOUNTER — Emergency Department (HOSPITAL_COMMUNITY)
Admission: EM | Admit: 2020-12-05 | Discharge: 2020-12-06 | Disposition: A | Discharge status: Home / Self Care | Payer: 59 | Attending: Emergency Medicine | Admitting: Emergency Medicine

## 2020-12-05 DIAGNOSIS — Z20822 Contact with and (suspected) exposure to covid-19: Secondary | ICD-10-CM | POA: Insufficient documentation

## 2020-12-05 DIAGNOSIS — J209 Acute bronchitis, unspecified: Secondary | ICD-10-CM | POA: Diagnosis not present

## 2020-12-05 DIAGNOSIS — R0602 Shortness of breath: Secondary | ICD-10-CM | POA: Insufficient documentation

## 2020-12-05 DIAGNOSIS — J45909 Unspecified asthma, uncomplicated: Secondary | ICD-10-CM | POA: Insufficient documentation

## 2020-12-05 DIAGNOSIS — Z87891 Personal history of nicotine dependence: Secondary | ICD-10-CM | POA: Diagnosis not present

## 2020-12-05 DIAGNOSIS — R5383 Other fatigue: Secondary | ICD-10-CM | POA: Diagnosis present

## 2020-12-05 LAB — RESP PANEL BY RT-PCR (FLU A&B, COVID) ARPGX2
Influenza A by PCR: NEGATIVE
Influenza B by PCR: NEGATIVE
SARS Coronavirus 2 by RT PCR: NEGATIVE

## 2020-12-05 NOTE — ED Provider Notes (Signed)
Emergency Medicine Provider Triage Evaluation Note  Keith Henry , a 45 y.o. male  was evaluated in triage.  Pt complains of fatigue.  States since this afternoon he has been feeling poorly.  Feels congested, tired, and foggy headed.  Denies any specific pain.  No sick contacts.  No fever.  Review of Systems  Positive: Congestion, fatigue Negative: fever  Physical Exam  BP 103/64 (BP Location: Right Arm)   Pulse 64   Temp 98.4 F (36.9 C) (Oral)   Resp 16   Ht 5\' 10"  (1.778 m)   Wt 93 kg   SpO2 99%   BMI 29.41 kg/m  Gen:   Awake, no distress   Resp:  Normal effort, sounds congested, slight cough MSK:   Moves extremities without difficulty  Other:  Playing chess on cellphone during exam  Medical Decision Making  Medically screening exam initiated at 10:18 PM.  Appropriate orders placed.  was informed that the remainder of the evaluation will be completed by another provider, this initial triage assessment does not replace that evaluation, and the importance of remaining in the ED until their evaluation is complete.  Covid/flu swab sent.   [name], PA-C 12/05/20 2219    2220, MD 12/06/20 [phone]

## 2020-12-05 NOTE — ED Triage Notes (Signed)
Pt states lightheadedness, fatigue since this afternoon.

## 2020-12-06 ENCOUNTER — Emergency Department (HOSPITAL_COMMUNITY): Payer: 59

## 2020-12-06 ENCOUNTER — Other Ambulatory Visit: Payer: Self-pay

## 2020-12-06 LAB — CBC WITH DIFFERENTIAL/PLATELET
Abs Immature Granulocytes: 0.02 10*3/uL (ref 0.00–0.07)
Basophils Absolute: 0.1 10*3/uL (ref 0.0–0.1)
Basophils Relative: 1 %
Eosinophils Absolute: 0.5 10*3/uL (ref 0.0–0.5)
Eosinophils Relative: 6 %
HCT: 39.3 % (ref 39.0–52.0)
Hemoglobin: 12.9 g/dL — ABNORMAL LOW (ref 13.0–17.0)
Immature Granulocytes: 0 %
Lymphocytes Relative: 23 %
Lymphs Abs: 2.2 10*3/uL (ref 0.7–4.0)
MCH: 32.7 pg (ref 26.0–34.0)
MCHC: 32.8 g/dL (ref 30.0–36.0)
MCV: 99.7 fL (ref 80.0–100.0)
Monocytes Absolute: 0.8 10*3/uL (ref 0.1–1.0)
Monocytes Relative: 8 %
Neutro Abs: 5.8 10*3/uL (ref 1.7–7.7)
Neutrophils Relative %: 62 %
Platelets: 337 10*3/uL (ref 150–400)
RBC: 3.94 MIL/uL — ABNORMAL LOW (ref 4.22–5.81)
RDW: 11.9 % (ref 11.5–15.5)
WBC: 9.3 10*3/uL (ref 4.0–10.5)
nRBC: 0 % (ref 0.0–0.2)

## 2020-12-06 LAB — BASIC METABOLIC PANEL
Anion gap: 5 (ref 5–15)
BUN: 16 mg/dL (ref 6–20)
CO2: 28 mmol/L (ref 22–32)
Calcium: 8.2 mg/dL — ABNORMAL LOW (ref 8.9–10.3)
Chloride: 110 mmol/L (ref 98–111)
Creatinine, Ser: 1.49 mg/dL — ABNORMAL HIGH (ref 0.61–1.24)
GFR, Estimated: 58 mL/min — ABNORMAL LOW (ref >60)
Glucose, Bld: 113 mg/dL — ABNORMAL HIGH (ref 70–99)
Potassium: 4.5 mmol/L (ref 3.5–5.1)
Sodium: 143 mmol/L (ref 135–145)

## 2020-12-06 LAB — D-DIMER, QUANTITATIVE: D-Dimer, Quant: 0.54 ug/mL-FEU — ABNORMAL HIGH (ref 0.00–0.50)

## 2020-12-06 LAB — TROPONIN I (HIGH SENSITIVITY): Troponin I (High Sensitivity): <2 ng/L (ref <18)

## 2020-12-06 MED ORDER — SODIUM CHLORIDE 0.9 % IV BOLUS
500.0000 mL | Freq: Once | INTRAVENOUS | Status: AC
  Administered 2020-12-06: 500 mL via INTRAVENOUS

## 2020-12-06 MED ORDER — IOHEXOL 350 MG/ML SOLN
65.0000 mL | Freq: Once | INTRAVENOUS | Status: AC | PRN
  Administered 2020-12-06: 65 mL via INTRAVENOUS

## 2020-12-06 MED ORDER — IPRATROPIUM-ALBUTEROL 0.5-2.5 (3) MG/3ML IN SOLN
3.0000 mL | Freq: Once | RESPIRATORY_TRACT | Status: AC
  Administered 2020-12-06: 3 mL via RESPIRATORY_TRACT
  Filled 2020-12-06: qty 3

## 2020-12-06 MED ORDER — DOXYCYCLINE HYCLATE 100 MG PO CAPS: 100.0000 mg | ORAL_CAPSULE | Freq: Two times a day (BID) | ORAL | 0 refills | Status: DC

## 2020-12-06 MED ORDER — DOXYCYCLINE HYCLATE 100 MG PO TABS
100.0000 mg | ORAL_TABLET | Freq: Once | ORAL | Status: AC
  Administered 2020-12-06: 100 mg via ORAL
  Filled 2020-12-06: qty 1

## 2020-12-06 MED ORDER — DEXAMETHASONE 4 MG PO TABS
10.0000 mg | ORAL_TABLET | Freq: Once | ORAL | Status: AC
  Administered 2020-12-06: 10 mg via ORAL
  Filled 2020-12-06: qty 3

## 2020-12-06 NOTE — ED Provider Notes (Signed)
[facility] EMERGENCY DEPARTMENT Provider Note   CSN: [id number] Arrival date & time: 12/05/20  1950     History Chief Complaint  Patient presents with   Fatigue    Keith Henry is a 45 y.o. male.  The history is provided by the patient and medical records.  Keith Henry is a 45 y.o. male who presents to the Emergency Department complaining of fatigue.  He started feeling poorly at 430 this afternoon.  He has cough, HA, fatigue.  Cough is productive of white sputum.  No chest pain.  Has mild sob.  No hematochezia, melena, N/V, abdominal pain, leg swelling or pain.  Sxs are moderate and constant in nature.  No drug exposures.    Has a hx/o asthma.      Past Medical History:  Diagnosis Date   Asthma     There are no problems to display for this patient.   Past Surgical History:  Procedure Laterality Date   ANKLE SURGERY     SKIN GRAFT     SKULL FRACTURE ELEVATION     pt unsure of specifics       No family history on file.  Social History   Tobacco Use   Smoking status: Former    Packs/day: 2.00    Types: Cigarettes    Quit date: 09/23/2020    Years since quitting: 0.2   Smokeless tobacco: Never  Vaping Use   Vaping Use: Never used  Substance Use Topics   Alcohol use: Yes    Alcohol/week: 2.0 standard drinks    Types: 2 Standard drinks or equivalent per week    Comment: occasion   Drug use: Yes    Types: Marijuana    Home Medications Prior to Admission medications   Medication Sig Start Date End Date Taking? Authorizing Provider  doxycycline (VIBRAMYCIN) 100 MG capsule Take 1 capsule (100 mg total) by mouth 2 (two) times daily. 12/06/20  Yes Tilden Fossa, MD  albuterol (VENTOLIN HFA) 108 (90 Base) MCG/ACT inhaler Inhale 2 puffs into the lungs every 4 (four) hours as needed for wheezing or shortness of breath. 09/29/20   Pollina, Canary Brim, MD  albuterol (VENTOLIN HFA) 108 (90 Base) MCG/ACT inhaler Inhale 1-2 puffs into the  lungs every 6 (six) hours as needed for wheezing or shortness of breath. 11/22/20   Linwood Dibbles, MD  amoxicillin-clavulanate (AUGMENTIN) 875-125 MG tablet Take 1 tablet by mouth every 12 (twelve) hours for 7 days. 11/30/20 12/07/20  Sabas Sous, MD  predniSONE (DELTASONE) 50 MG tablet 1 tablet PO QD X4 days 11/22/20   Linwood Dibbles, MD    Allergies    Other and Methylprednisolone sodium succ  Review of Systems   Review of Systems  All other systems reviewed and are negative.  Physical Exam Updated Vital Signs BP 110/73   Pulse 78   Temp 98.4 F (36.9 C) (Oral)   Resp 14   Ht 5\' 10"  (1.778 m)   Wt 93 kg   SpO2 98%   BMI 29.41 kg/m   Physical Exam Vitals and nursing note reviewed.  Constitutional:      Appearance: He is well-developed.  HENT:     Head: Normocephalic and atraumatic.  Cardiovascular:     Rate and Rhythm: Normal rate and regular rhythm.     Heart sounds: No murmur heard. Pulmonary:     Effort: Pulmonary effort is normal. No respiratory distress.     Comments: Occasional expiratory wheezes Abdominal:  Palpations: Abdomen is soft.     Tenderness: There is no abdominal tenderness. There is no guarding or rebound.  Musculoskeletal:        General: No tenderness.  Skin:    General: Skin is warm and dry.  Neurological:     Mental Status: He is alert and oriented to person, place, and time.  Psychiatric:        Behavior: Behavior normal.    ED Results / Procedures / Treatments   Labs (all labs ordered are listed, but only abnormal results are displayed) Labs Reviewed  BASIC METABOLIC PANEL - Abnormal; Notable for the following components:      Result Value   Glucose, Bld 113 (*)    Creatinine, Ser 1.49 (*)    Calcium 8.2 (*)    GFR, Estimated 58 (*)    All other components within normal limits  CBC WITH DIFFERENTIAL/PLATELET - Abnormal; Notable for the following components:   RBC 3.94 (*)    Hemoglobin 12.9 (*)    All other components within  normal limits  D-DIMER, QUANTITATIVE - Abnormal; Notable for the following components:   D-Dimer, Quant 0.54 (*)    All other components within normal limits  RESP PANEL BY RT-PCR (FLU A&B, COVID) ARPGX2  TROPONIN I (HIGH SENSITIVITY)    EKG EKG Interpretation  Date/Time:  Thursday December 06 2020 02:31:24 EDT Ventricular Rate:  75 PR Interval:  168 QRS Duration: 92 QT Interval:  375 QTC Calculation: 419 R Axis:   54 Text Interpretation: Sinus rhythm Abnormal R-wave progression, early transition Borderline T wave abnormalities Confirmed by Tilden Fossa [phone]) on 12/06/2020 4:39:43 AM  Radiology DG Chest Port 1 View  Result Date: 12/06/2020 CLINICAL DATA:  Shortness of breath, fatigue EXAM: PORTABLE CHEST 1 VIEW COMPARISON:  11/22/2020 FINDINGS: Lungs are clear.  No pleural effusion or pneumothorax. The heart is normal in size. IMPRESSION: No evidence of acute cardiopulmonary disease. Electronically Signed   By: Charline Bills M.D.   On: 12/06/2020 00:54    Procedures Procedures   Medications Ordered in ED Medications  doxycycline (VIBRA-TABS) tablet 100 mg (has no administration in time range)  dexamethasone (DECADRON) tablet 10 mg (has no administration in time range)  ipratropium-albuterol (DUONEB) 0.5-2.5 (3) MG/3ML nebulizer solution 3 mL (3 mLs Nebulization Given 12/06/20 0235)  sodium chloride 0.9 % bolus 500 mL (0 mLs Intravenous Stopped 12/06/20 0411)  iohexol (OMNIPAQUE) 350 MG/ML injection 65 mL (65 mLs Intravenous Contrast Given 12/06/20 0400)    ED Course  I have reviewed the triage vital signs and the nursing notes.  Pertinent labs & imaging results that were available during my care of the patient were reviewed by me and considered in my medical decision making (see chart for details).    MDM Rules/Calculators/A&P                          patient with history of asthma here for evaluation of malaise, cough, fatigue and headache. On evaluation he is  non-toxic appearing and well hydrated. He does have occasional wheezing without respiratory distress. Labs significant for mild elevation is creatinine when compared to baseline. D dimer was obtained, which was mildly elevated and a CTA was obtained. CTA is negative for PE but does demonstrate some bronchitis versus pneumonitis. Will treat with antibiotics for possible acute bronchitis with asthma exacerbation. Will also treat with one-time dose of Decadron. Discussed with patient home care, PCP follow-up and return precautions.  Final Clinical Impression(s) / ED Diagnoses Final diagnoses:  Acute bronchitis, unspecified organism    Rx / DC Orders ED Discharge Orders          Ordered    doxycycline (VIBRAMYCIN) 100 MG capsule  2 times daily        12/06/20 0437             Tilden Fossa, MD 12/06/20 [phone]

## 2020-12-06 NOTE — ED Notes (Signed)
Pt requested to eat his sandwhich before receiving breathing tx.  Meds are at bedside when he is ready

## 2020-12-13 ENCOUNTER — Encounter (HOSPITAL_COMMUNITY): Payer: Self-pay | Admitting: Emergency Medicine

## 2020-12-13 ENCOUNTER — Other Ambulatory Visit: Payer: Self-pay

## 2020-12-13 ENCOUNTER — Emergency Department (HOSPITAL_COMMUNITY)
Admission: EM | Admit: 2020-12-13 | Discharge: 2020-12-13 | Disposition: A | Discharge status: Home / Self Care | Payer: 59 | Attending: Emergency Medicine | Admitting: Emergency Medicine

## 2020-12-13 DIAGNOSIS — Z87891 Personal history of nicotine dependence: Secondary | ICD-10-CM | POA: Insufficient documentation

## 2020-12-13 DIAGNOSIS — R000 Tachycardia, unspecified: Secondary | ICD-10-CM | POA: Insufficient documentation

## 2020-12-13 DIAGNOSIS — K047 Periapical abscess without sinus: Secondary | ICD-10-CM

## 2020-12-13 DIAGNOSIS — J45909 Unspecified asthma, uncomplicated: Secondary | ICD-10-CM | POA: Insufficient documentation

## 2020-12-13 MED ORDER — CLINDAMYCIN HCL 300 MG PO CAPS: 300.0000 mg | ORAL_CAPSULE | Freq: Three times a day (TID) | ORAL | 0 refills | Status: AC

## 2020-12-13 MED ORDER — HYDROCODONE-ACETAMINOPHEN 5-325 MG PO TABS
1.0000 | ORAL_TABLET | Freq: Once | ORAL | Status: AC
  Administered 2020-12-13: 1 via ORAL
  Filled 2020-12-13: qty 1

## 2020-12-13 MED ORDER — ALBUTEROL SULFATE (2.5 MG/3ML) 0.083% IN NEBU
5.0000 mg | INHALATION_SOLUTION | Freq: Once | RESPIRATORY_TRACT | Status: AC
  Administered 2020-12-13: 5 mg via RESPIRATORY_TRACT
  Filled 2020-12-13: qty 6

## 2020-12-13 MED ORDER — HYDROCODONE-ACETAMINOPHEN 7.5-325 MG PO TABS: 1.0000 | ORAL_TABLET | Freq: Four times a day (QID) | ORAL | 0 refills | Status: AC | PRN

## 2020-12-13 MED ORDER — CLINDAMYCIN HCL 150 MG PO CAPS
300.0000 mg | ORAL_CAPSULE | Freq: Once | ORAL | Status: AC
  Administered 2020-12-13: 300 mg via ORAL
  Filled 2020-12-13: qty 2

## 2020-12-13 NOTE — ED Notes (Signed)
Pt sleeping when RN entered room to give meds. Pt took medicine and then began eating. Administered breathing treatment.

## 2020-12-13 NOTE — Discharge Instructions (Addendum)
You were seen today for evaluation of your dental abscess which did not improve with the last course of Augmentin.  I have given you a new antibiotic called clindamycin -  Please complete the entire course of this as well.  I have also given you some pain medication.  However, this abscess urgently needs dental care in order for it to be treated.  I have attached a resource guide here that lists the dental facilities that provide low income services.  Please call 1 of these immediately in order to get your abscess treated.  Please return if you develop fever and chills.

## 2020-12-13 NOTE — ED Notes (Signed)
Patient refused vitalsignsx3

## 2020-12-13 NOTE — ED Notes (Signed)
After arrival to room, pt resting on the bed in NAD. Pt asked this RN if everyone who comes back gets a tray. Explained to pt that meal trays are given to pt's that are being admitted or boarding in the ED. Pt did not verbalized understanding or verbalize not understanding.

## 2020-12-13 NOTE — ED Provider Notes (Signed)
[facility] EMERGENCY DEPARTMENT Provider Note   CSN: [id number] Arrival date & time: 12/13/20  0030     History Chief Complaint  Patient presents with   Dental Pain    LINDSEY HOMMEL is a 45 y.o. male who presents to the ED for evaluation of a right-sided mandibular abscess for which she was seen last week.  He was given a course of Augmentin, patient states that he completed the entire course.  However his abscess has remained and potentially worsened.  He has tenderness from the jaw down the right side of his neck.  He is having difficulty eating and drinking due to pain upon swallowing.  He denies fevers, vision changes, ear pain, and headaches.       Dental Pain Associated symptoms: facial swelling   Associated symptoms: no fever and no headaches       Past Medical History:  Diagnosis Date   Asthma     There are no problems to display for this patient.   Past Surgical History:  Procedure Laterality Date   ANKLE SURGERY     SKIN GRAFT     SKULL FRACTURE ELEVATION     pt unsure of specifics       No family history on file.  Social History   Tobacco Use   Smoking status: Former    Packs/day: 2.00    Types: Cigarettes    Quit date: 09/23/2020    Years since quitting: 0.2   Smokeless tobacco: Never  Vaping Use   Vaping Use: Never used  Substance Use Topics   Alcohol use: Yes    Alcohol/week: 2.0 standard drinks    Types: 2 Standard drinks or equivalent per week    Comment: occasion   Drug use: Yes    Types: Marijuana    Home Medications Prior to Admission medications   Medication Sig Start Date End Date Taking? Authorizing Provider  clindamycin (CLEOCIN) 300 MG capsule Take 1 capsule (300 mg total) by mouth 3 (three) times daily for 7 days. 12/13/20 12/20/20 Yes Janell Quiet, PA-C  HYDROcodone-acetaminophen (NORCO) 7.5-325 MG tablet Take 1 tablet by mouth every 6 (six) hours as needed for up to 3 days for moderate pain.  12/13/20 12/16/20 Yes Horton, Clabe Seal, DO  albuterol (VENTOLIN HFA) 108 (90 Base) MCG/ACT inhaler Inhale 2 puffs into the lungs every 4 (four) hours as needed for wheezing or shortness of breath. 09/29/20   Pollina, Canary Brim, MD  albuterol (VENTOLIN HFA) 108 (90 Base) MCG/ACT inhaler Inhale 1-2 puffs into the lungs every 6 (six) hours as needed for wheezing or shortness of breath. 11/22/20   Linwood Dibbles, MD  doxycycline (VIBRAMYCIN) 100 MG capsule Take 1 capsule (100 mg total) by mouth 2 (two) times daily. 12/06/20   Tilden Fossa, MD  predniSONE (DELTASONE) 50 MG tablet 1 tablet PO QD X4 days 11/22/20   Linwood Dibbles, MD    Allergies    Other and Methylprednisolone sodium succ  Review of Systems   Review of Systems  Constitutional:  Negative for fever.  HENT:  Positive for dental problem, facial swelling and trouble swallowing.   Eyes: Negative.   Respiratory:  Negative for shortness of breath.   Cardiovascular: Negative.   Gastrointestinal:  Negative for abdominal pain and vomiting.  Endocrine: Negative.   Genitourinary: Negative.   Musculoskeletal: Negative.   Skin:  Negative for rash.  Neurological:  Negative for headaches.  All other systems reviewed and are negative.  Physical Exam Updated Vital Signs BP 125/76   Pulse 69   Temp 97.8 F (36.6 C) (Oral)   Resp 14   SpO2 93%   Physical Exam Vitals and nursing note reviewed.  Constitutional:      General: He is not in acute distress.    Appearance: He is ill-appearing.  HENT:     Head: Atraumatic.     Right Ear: No mastoid tenderness.     Left Ear: No mastoid tenderness.     Mouth/Throat:     Dentition: Abnormal dentition. Dental abscesses present.     Comments: Patient has severe gingivitis throughout the entire mouth.  Numerous dental caries.  Eyes:     Extraocular Movements: Extraocular movements intact.     Conjunctiva/sclera: Conjunctivae normal.  Neck:   Cardiovascular:     Rate and Rhythm: Regular  rhythm. Tachycardia present.     Pulses: Normal pulses.     Heart sounds: No murmur heard. Pulmonary:     Effort: Pulmonary effort is normal. No respiratory distress.     Breath sounds: Wheezing present.     Comments: Patient has significant expiratory wheezing throughout all lung fields.  Oxygen is 94% on room air. Abdominal:     General: Abdomen is flat. There is no distension.     Palpations: Abdomen is soft.     Tenderness: There is no abdominal tenderness.  Musculoskeletal:        General: Normal range of motion.     Cervical back: Normal range of motion.  Skin:    General: Skin is warm and dry.     Capillary Refill: Capillary refill takes less than 2 seconds.  Neurological:     General: No focal deficit present.     Mental Status: He is alert.  Psychiatric:        Mood and Affect: Mood normal.    ED Results / Procedures / Treatments   Labs (all labs ordered are listed, but only abnormal results are displayed) Labs Reviewed - No data to display  EKG None  Radiology No results found.  Procedures Procedures   Medications Ordered in ED Medications  clindamycin (CLEOCIN) capsule 300 mg (300 mg Oral Given 12/13/20 0920)  HYDROcodone-acetaminophen (NORCO/VICODIN) 5-325 MG per tablet 1 tablet (1 tablet Oral Given 12/13/20 0920)  albuterol (PROVENTIL) (2.5 MG/3ML) 0.083% nebulizer solution 5 mg (5 mg Nebulization Given 12/13/20 0921)  HYDROcodone-acetaminophen (NORCO/VICODIN) 5-325 MG per tablet 1 tablet (1 tablet Oral Given 12/13/20 1130)    ED Course  I have reviewed the triage vital signs and the nursing notes.  Pertinent labs & imaging results that were available during my care of the patient were reviewed by me and considered in my medical decision making (see chart for details).    MDM Rules/Calculators/A&P                         45 year old male presents today for reevaluation of a dental abscess.  He was seen for same problem 1 week ago and completed a  course of Augmentin without improvement.  Patient is afebrile today, slightly tachycardic at triage but normal HR at recheck.Marland Kitchen  He is also seen recently for bronchitis, continues to have expiratory wheezing.   Given albuterol neb for wheezing with improvement. Oxygen is 94% on room air.  On physical exam, patient has painful jaw opening.  He has exquisite tenderness of the abscess along the right jawline, with tenderness rating down both  right lateral aspect of his neck. No submandibular swelling or induration.  No mastoid tenderness.  Given 2 doses of Norco 5 for pain control. Started clindamycin PO for infection.   Discussed with patient's the limitations of treatment within the ED.  Provided a Insurance claims handler for dental services for low income persons.  Strongly stressed how urgent it is that he make an appointment with 1 of theClinics in order to get definitive treatment for his abscess.  Provided him with a 7-day course of clindamycin and a few days worth of Norco for pain control.  Patient discharged home in otherwise good condition.    Final Clinical Impression(s) / ED Diagnoses Final diagnoses:  Dental abscess    Rx / DC Orders ED Discharge Orders          Ordered    clindamycin (CLEOCIN) 300 MG capsule  3 times daily        12/13/20 1123    HYDROcodone-acetaminophen (NORCO) 7.5-325 MG tablet  Every 6 hours PRN        12/13/20 1125             Janell Quiet, PA-C 12/13/20 1236    Rozelle Logan, DO 12/19/20 1401

## 2020-12-13 NOTE — ED Notes (Signed)
Pt ambulated to room, no apparent distress. Sitting in bed eating Cheetos.

## 2020-12-13 NOTE — ED Triage Notes (Signed)
Pt states he has a dental abscess on the right   Just finished amoxicillin last week but states it is not better.

## 2020-12-13 NOTE — ED Notes (Signed)
Patient refused vital signs 

## 2020-12-29 ENCOUNTER — Emergency Department (HOSPITAL_COMMUNITY)
Admission: EM | Admit: 2020-12-29 | Discharge: 2020-12-29 | Disposition: A | Discharge status: Home / Self Care | Payer: 59 | Attending: Emergency Medicine | Admitting: Emergency Medicine

## 2020-12-29 ENCOUNTER — Emergency Department (HOSPITAL_COMMUNITY): Payer: 59

## 2020-12-29 ENCOUNTER — Encounter (HOSPITAL_COMMUNITY): Payer: Self-pay | Admitting: *Deleted

## 2020-12-29 ENCOUNTER — Other Ambulatory Visit: Payer: Self-pay

## 2020-12-29 DIAGNOSIS — J4521 Mild intermittent asthma with (acute) exacerbation: Secondary | ICD-10-CM | POA: Diagnosis not present

## 2020-12-29 DIAGNOSIS — Z87891 Personal history of nicotine dependence: Secondary | ICD-10-CM | POA: Diagnosis not present

## 2020-12-29 DIAGNOSIS — Z20822 Contact with and (suspected) exposure to covid-19: Secondary | ICD-10-CM | POA: Insufficient documentation

## 2020-12-29 DIAGNOSIS — R0602 Shortness of breath: Secondary | ICD-10-CM | POA: Diagnosis present

## 2020-12-29 LAB — RESP PANEL BY RT-PCR (FLU A&B, COVID) ARPGX2
Influenza A by PCR: NEGATIVE
Influenza B by PCR: NEGATIVE
SARS Coronavirus 2 by RT PCR: NEGATIVE

## 2020-12-29 MED ORDER — AEROCHAMBER PLUS FLO-VU LARGE MISC
1.0000 | Freq: Once | Status: AC
  Administered 2020-12-29: 1

## 2020-12-29 MED ORDER — ALBUTEROL SULFATE HFA 108 (90 BASE) MCG/ACT IN AERS
2.0000 | INHALATION_SPRAY | RESPIRATORY_TRACT | Status: DC | PRN
  Administered 2020-12-29: 2 via RESPIRATORY_TRACT
  Filled 2020-12-29: qty 6.7

## 2020-12-29 MED ORDER — PREDNISONE 10 MG (21) PO TBPK: ORAL_TABLET | Freq: Every day | ORAL | 0 refills | Status: DC

## 2020-12-29 MED ORDER — PREDNISONE 20 MG PO TABS
60.0000 mg | ORAL_TABLET | Freq: Once | ORAL | Status: AC
  Administered 2020-12-29: 60 mg via ORAL
  Filled 2020-12-29: qty 3

## 2020-12-29 MED ORDER — DEXAMETHASONE SODIUM PHOSPHATE 10 MG/ML IJ SOLN
10.0000 mg | Freq: Once | INTRAMUSCULAR | Status: DC
  Filled 2020-12-29: qty 1

## 2020-12-29 MED ORDER — ALBUTEROL SULFATE HFA 108 (90 BASE) MCG/ACT IN AERS: 1.0000 | INHALATION_SPRAY | Freq: Four times a day (QID) | RESPIRATORY_TRACT | 0 refills | Status: DC | PRN

## 2020-12-29 MED ORDER — ALBUTEROL SULFATE (2.5 MG/3ML) 0.083% IN NEBU
10.0000 mg/h | INHALATION_SOLUTION | RESPIRATORY_TRACT | Status: DC
  Administered 2020-12-29: 10 mg/h via RESPIRATORY_TRACT
  Filled 2020-12-29 (×2): qty 12

## 2020-12-29 MED ORDER — DEXAMETHASONE SODIUM PHOSPHATE 10 MG/ML IJ SOLN
10.0000 mg | Freq: Once | INTRAMUSCULAR | Status: AC
  Administered 2020-12-29: 10 mg via INTRAMUSCULAR

## 2020-12-29 MED ORDER — ALBUTEROL SULFATE HFA 108 (90 BASE) MCG/ACT IN AERS
2.0000 | INHALATION_SPRAY | Freq: Once | RESPIRATORY_TRACT | Status: DC
  Filled 2020-12-29: qty 6.7

## 2020-12-29 NOTE — ED Provider Notes (Signed)
[facility] EMERGENCY DEPARTMENT Provider Note   CSN: [id number] Arrival date & time: 12/29/20  1507     History Chief Complaint  Patient presents with   Back Pain   Asthma    Keith Henry is a 45 y.o. male.  Pt presents to the ED today with sob and cough.  Pt said he has a hx of asthma and has been using his inhalers, but they have not been working.  Pt denies f/c.  He said sx have been going on for 1 week.      Past Medical History:  Diagnosis Date   Asthma     There are no problems to display for this patient.   Past Surgical History:  Procedure Laterality Date   ANKLE SURGERY     SKIN GRAFT     SKULL FRACTURE ELEVATION     pt unsure of specifics       No family history on file.  Social History   Tobacco Use   Smoking status: Former    Packs/day: 2.00    Types: Cigarettes    Quit date: 09/23/2020    Years since quitting: 0.2   Smokeless tobacco: Never  Vaping Use   Vaping Use: Never used  Substance Use Topics   Alcohol use: Yes    Alcohol/week: 2.0 standard drinks    Types: 2 Standard drinks or equivalent per week    Comment: occasion   Drug use: Yes    Types: Marijuana    Home Medications Prior to Admission medications   Medication Sig Start Date End Date Taking? Authorizing Provider  predniSONE (STERAPRED UNI-PAK 21 TAB) 10 MG (21) TBPK tablet Take by mouth daily. Take 6 tabs by mouth daily  for 2 days, then 5 tabs for 2 days, then 4 tabs for 2 days, then 3 tabs for 2 days, 2 tabs for 2 days, then 1 tab by mouth daily for 2 days 12/29/20  Yes Jacalyn Lefevre, MD  albuterol (VENTOLIN HFA) 108 (90 Base) MCG/ACT inhaler Inhale 2 puffs into the lungs every 4 (four) hours as needed for wheezing or shortness of breath. 09/29/20   Pollina, Canary Brim, MD  albuterol (VENTOLIN HFA) 108 (90 Base) MCG/ACT inhaler Inhale 1-2 puffs into the lungs every 6 (six) hours as needed for wheezing or shortness of breath. 12/29/20   Jacalyn Lefevre, MD  doxycycline (VIBRAMYCIN) 100 MG capsule Take 1 capsule (100 mg total) by mouth 2 (two) times daily. 12/06/20   Tilden Fossa, MD    Allergies    Other and Methylprednisolone sodium succ  Review of Systems   Review of Systems  Respiratory:  Positive for cough, shortness of breath and wheezing.   All other systems reviewed and are negative.  Physical Exam Updated Vital Signs BP 111/65 (BP Location: Right Arm)   Pulse 81   Temp 98.4 F (36.9 C) (Oral)   Resp 18   Ht 5\' 10"  (1.778 m)   Wt 93 kg   SpO2 92%   BMI 29.41 kg/m   Physical Exam Vitals and nursing note reviewed.  Constitutional:      Appearance: Normal appearance.  HENT:     Head: Normocephalic and atraumatic.     Right Ear: External ear normal.     Left Ear: External ear normal.     Nose: Nose normal.     Mouth/Throat:     Mouth: Mucous membranes are dry.  Eyes:     Extraocular Movements: Extraocular  movements intact.     Conjunctiva/sclera: Conjunctivae normal.     Pupils: Pupils are equal, round, and reactive to light.  Cardiovascular:     Rate and Rhythm: Normal rate and regular rhythm.     Pulses: Normal pulses.     Heart sounds: Normal heart sounds.  Pulmonary:     Breath sounds: Wheezing present.  Abdominal:     General: Abdomen is flat. Bowel sounds are normal.     Palpations: Abdomen is soft.  Musculoskeletal:        General: Normal range of motion.     Cervical back: Normal range of motion and neck supple.  Skin:    General: Skin is warm.     Capillary Refill: Capillary refill takes less than 2 seconds.  Neurological:     General: No focal deficit present.     Mental Status: He is alert and oriented to person, place, and time.  Psychiatric:        Mood and Affect: Mood normal.        Behavior: Behavior normal.    ED Results / Procedures / Treatments   Labs (all labs ordered are listed, but only abnormal results are displayed) Labs Reviewed  RESP PANEL BY RT-PCR (FLU A&B,  COVID) ARPGX2    EKG None  Radiology DG Chest 2 View  Result Date: 12/29/2020 CLINICAL DATA:  Shortness of breath, cough EXAM: CHEST - 2 VIEW COMPARISON:  12/06/2020 FINDINGS: The heart size and mediastinal contours are within normal limits. Both lungs are clear. Old healed fracture is seen in the right clavicle. IMPRESSION: No active cardiopulmonary disease. Electronically Signed   By: Ernie Avena M.D.   On: 12/29/2020 16:54    Procedures Procedures   Medications Ordered in ED Medications  predniSONE (DELTASONE) tablet 60 mg (60 mg Oral Given 12/29/20 1727)  dexamethasone (DECADRON) injection 10 mg (10 mg Intramuscular Given 12/29/20 1824)  AeroChamber Plus Flo-Vu Large MISC 1 each (1 each Other Given 12/29/20 2032)    ED Course  I have reviewed the triage vital signs and the nursing notes.  Pertinent labs & imaging results that were available during my care of the patient were reviewed by me and considered in my medical decision making (see chart for details).    MDM Rules/Calculators/A&P                           Pt is feeling much better after nebs and decadron.  Pt is stable for d/c.  He is to return if worse.  F/u with pcp. Final Clinical Impression(s) / ED Diagnoses Final diagnoses:  Mild intermittent asthma with exacerbation    Rx / DC Orders ED Discharge Orders          Ordered    predniSONE (STERAPRED UNI-PAK 21 TAB) 10 MG (21) TBPK tablet  Daily        12/29/20 2025    albuterol (VENTOLIN HFA) 108 (90 Base) MCG/ACT inhaler  Every 6 hours PRN        12/29/20 2025             Jacalyn Lefevre, MD 12/30/20 1710

## 2020-12-29 NOTE — ED Triage Notes (Signed)
PT states he is here for back pain and asthma.  Snoring in triage, but will wake up to answer questions.  Does not appear in any distress.

## 2020-12-29 NOTE — ED Provider Notes (Signed)
Emergency Medicine Provider Triage Evaluation Note  Keith Henry , a 45 y.o. male  was evaluated in triage.  Pt complains of 1 week of coughing wheezing and congestion of sinuses. States cough productive of yellow mucus. No hemoptysis.  Review of Systems  Positive: Cough, fatigue, wheezing congestion Negative: Fever   Physical Exam  BP 105/68 (BP Location: Right Arm)   Pulse 87   Temp 98.6 F (37 C) (Oral)   Resp 20   Ht 5\' 10"  (1.778 m)   Wt 93 kg   SpO2 98%   BMI 29.41 kg/m  Gen:   Awake, no distress   Resp:  Normal effort, course lung sounds. Good airmovement but wheezing present on expiration. MSK:   Moves extremities without difficulty  Other:  Drowsy but arousable to verbal stimulus.   Medical Decision Making  Medically screening exam initiated at 4:13 PM.  Appropriate orders placed.  was informed that the remainder of the evaluation will be completed by another provider, this initial triage assessment does not replace that evaluation, and the importance of remaining in the ED until their evaluation is complete.  Cough, congestion for 1 week now some wheezing.  Fatigued on exam and wheezing but good air movement. Likely viral illness prompting asthma exacerbation  Keith Henry was evaluated in Emergency Department on 12/29/2020 for the symptoms described in the history of present illness. He was evaluated in the context of the global COVID-19 pandemic, which necessitated consideration that the patient might be at risk for infection with the SARS-CoV-2 virus that causes COVID-19. Institutional protocols and algorithms that pertain to the evaluation of patients at risk for COVID-19 are in a state of rapid change based on information released by regulatory bodies including the CDC and federal and state organizations. These policies and algorithms were followed during the patient's care in the ED.    12/31/2020 Haskins, DOLE 12/29/20 1617    12/31/20,  MD 12/29/20 [phone]

## 2021-01-21 ENCOUNTER — Other Ambulatory Visit: Payer: Self-pay

## 2021-01-21 ENCOUNTER — Encounter (HOSPITAL_COMMUNITY): Payer: Self-pay | Admitting: *Deleted

## 2021-01-21 ENCOUNTER — Emergency Department (HOSPITAL_COMMUNITY)
Admission: EM | Admit: 2021-01-21 | Discharge: 2021-01-21 | Disposition: A | Discharge status: Home / Self Care | Payer: 59 | Attending: Emergency Medicine | Admitting: Emergency Medicine

## 2021-01-21 ENCOUNTER — Emergency Department (HOSPITAL_COMMUNITY): Payer: 59

## 2021-01-21 DIAGNOSIS — R160 Hepatomegaly, not elsewhere classified: Secondary | ICD-10-CM | POA: Insufficient documentation

## 2021-01-21 DIAGNOSIS — J45901 Unspecified asthma with (acute) exacerbation: Secondary | ICD-10-CM | POA: Insufficient documentation

## 2021-01-21 DIAGNOSIS — R109 Unspecified abdominal pain: Secondary | ICD-10-CM

## 2021-01-21 DIAGNOSIS — R062 Wheezing: Secondary | ICD-10-CM | POA: Diagnosis present

## 2021-01-21 DIAGNOSIS — Z87891 Personal history of nicotine dependence: Secondary | ICD-10-CM | POA: Insufficient documentation

## 2021-01-21 LAB — CBC WITH DIFFERENTIAL/PLATELET
Abs Immature Granulocytes: 0.02 10*3/uL (ref 0.00–0.07)
Basophils Absolute: 0.1 10*3/uL (ref 0.0–0.1)
Basophils Relative: 2 %
Eosinophils Absolute: 0.6 10*3/uL — ABNORMAL HIGH (ref 0.0–0.5)
Eosinophils Relative: 7 %
HCT: 42.1 % (ref 39.0–52.0)
Hemoglobin: 14.3 g/dL (ref 13.0–17.0)
Immature Granulocytes: 0 %
Lymphocytes Relative: 24 %
Lymphs Abs: 2 10*3/uL (ref 0.7–4.0)
MCH: 33.1 pg (ref 26.0–34.0)
MCHC: 34 g/dL (ref 30.0–36.0)
MCV: 97.5 fL (ref 80.0–100.0)
Monocytes Absolute: 0.7 10*3/uL (ref 0.1–1.0)
Monocytes Relative: 8 %
Neutro Abs: 4.8 10*3/uL (ref 1.7–7.7)
Neutrophils Relative %: 59 %
Platelets: 365 10*3/uL (ref 150–400)
RBC: 4.32 MIL/uL (ref 4.22–5.81)
RDW: 12 % (ref 11.5–15.5)
WBC: 8.2 10*3/uL (ref 4.0–10.5)
nRBC: 0 % (ref 0.0–0.2)

## 2021-01-21 LAB — URINALYSIS, ROUTINE W REFLEX MICROSCOPIC
Bilirubin Urine: NEGATIVE
Glucose, UA: NEGATIVE mg/dL
Hgb urine dipstick: NEGATIVE
Ketones, ur: NEGATIVE mg/dL
Nitrite: NEGATIVE
Protein, ur: NEGATIVE mg/dL
Specific Gravity, Urine: >1.03 — ABNORMAL HIGH (ref 1.005–1.030)
pH: 6 (ref 5.0–8.0)

## 2021-01-21 LAB — BASIC METABOLIC PANEL
Anion gap: 5 (ref 5–15)
BUN: 8 mg/dL (ref 6–20)
CO2: 27 mmol/L (ref 22–32)
Calcium: 8.2 mg/dL — ABNORMAL LOW (ref 8.9–10.3)
Chloride: 105 mmol/L (ref 98–111)
Creatinine, Ser: 1.04 mg/dL (ref 0.61–1.24)
GFR, Estimated: >60 mL/min (ref >60)
Glucose, Bld: 99 mg/dL (ref 70–99)
Potassium: 3.8 mmol/L (ref 3.5–5.1)
Sodium: 137 mmol/L (ref 135–145)

## 2021-01-21 LAB — URINALYSIS, MICROSCOPIC (REFLEX)

## 2021-01-21 LAB — LIPASE, BLOOD: Lipase: 27 U/L (ref 11–51)

## 2021-01-21 MED ORDER — IPRATROPIUM-ALBUTEROL 0.5-2.5 (3) MG/3ML IN SOLN
3.0000 mL | Freq: Once | RESPIRATORY_TRACT | Status: AC
  Administered 2021-01-21: 13:00:00 3 mL via RESPIRATORY_TRACT
  Filled 2021-01-21: qty 3

## 2021-01-21 MED ORDER — ALBUTEROL SULFATE HFA 108 (90 BASE) MCG/ACT IN AERS
2.0000 | INHALATION_SPRAY | Freq: Once | RESPIRATORY_TRACT | Status: AC
  Administered 2021-01-21: 13:00:00 2 via RESPIRATORY_TRACT
  Filled 2021-01-21: qty 6.7

## 2021-01-21 MED ORDER — DEXAMETHASONE SODIUM PHOSPHATE 10 MG/ML IJ SOLN
10.0000 mg | Freq: Once | INTRAMUSCULAR | Status: AC
  Administered 2021-01-21: 13:00:00 10 mg via INTRAVENOUS
  Filled 2021-01-21: qty 1

## 2021-01-21 MED ORDER — IBUPROFEN 600 MG PO TABS: 600.0000 mg | ORAL_TABLET | Freq: Four times a day (QID) | ORAL | 0 refills | Status: DC | PRN

## 2021-01-21 MED ORDER — METHOCARBAMOL 500 MG PO TABS: 500.0000 mg | ORAL_TABLET | Freq: Two times a day (BID) | ORAL | 0 refills | Status: DC

## 2021-01-21 MED ORDER — PREDNISONE 10 MG PO TABS: 20.0000 mg | ORAL_TABLET | Freq: Every day | ORAL | 0 refills | Status: AC

## 2021-01-21 MED ORDER — METHOCARBAMOL 500 MG PO TABS
500.0000 mg | ORAL_TABLET | Freq: Once | ORAL | Status: AC
  Administered 2021-01-21: 14:00:00 500 mg via ORAL
  Filled 2021-01-21: qty 1

## 2021-01-21 MED ORDER — KETOROLAC TROMETHAMINE 30 MG/ML IJ SOLN
30.0000 mg | Freq: Once | INTRAMUSCULAR | Status: AC
  Administered 2021-01-21: 14:00:00 30 mg via INTRAVENOUS
  Filled 2021-01-21: qty 1

## 2021-01-21 NOTE — ED Notes (Signed)
Pt was able to maintain O2 saturations above 94% while ambulating

## 2021-01-21 NOTE — Discharge Instructions (Addendum)
Your work-up today was negative for kidney stone.  You were treated with a breathing treatment and Decadron in the ED for your breathing.  You will be prescribed a short course of prednisone, Robaxin, and ibuprofen take as prescribed.  Do not drive or operate heavy machinery while taking the muscle relaxer.  You may use heat to the affected area for up to 15 minutes at a time, ensure to place a barrier between your skin and the heat.  You may follow-up with [place] [facility] to establish primary care.  Return to the ED if you are experiencing increasing/worsening chest pain, trouble breathing, or worsening symptoms.

## 2021-01-21 NOTE — ED Provider Notes (Signed)
[facility] EMERGENCY DEPARTMENT Provider Note   CSN: [id number] Arrival date & time: 01/21/21  0645     History Chief Complaint  Patient presents with   Flank Pain    KEM HENSEN is a 45 y.o. male with no significant past medical history who presents to the ED complaining of left flank pain x1 month. Patient reports he decided to be evaluated in the ED due to missing the bus to go to work today.  He denies his symptoms worsening as the cause of his ED evaluation.  He has not tried any medications for his symptoms.  He denies abdominal pain, chest pain, shortness of breath, nausea, vomiting, dysuria, hematuria.   Patient also reports he had been out of his albuterol inhaler for the past 2 weeks.  He has had associated wheezing and dry cough.  Patient has not tried any medications for his symptoms.  He denies fever, chills, chest pain, shortness of breath.  Denies sick contacts.  Patient smokes 3.5 PPD.  The history is provided by the patient. No language interpreter was used.     He has associated Past Medical History:  Diagnosis Date   Asthma     There are no problems to display for this patient.   Past Surgical History:  Procedure Laterality Date   ANKLE SURGERY     SKIN GRAFT     SKULL FRACTURE ELEVATION     pt unsure of specifics       No family history on file.  Social History   Tobacco Use   Smoking status: Former    Packs/day: 2.00    Types: Cigarettes    Quit date: 09/23/2020    Years since quitting: 0.3   Smokeless tobacco: Never  Vaping Use   Vaping Use: Never used  Substance Use Topics   Alcohol use: Yes    Alcohol/week: 2.0 standard drinks    Types: 2 Standard drinks or equivalent per week    Comment: occasion   Drug use: Yes    Types: Marijuana    Home Medications Prior to Admission medications   Medication Sig Start Date End Date Taking? Authorizing Provider  albuterol (VENTOLIN HFA) 108 (90 Base) MCG/ACT inhaler  Inhale 1-2 puffs into the lungs every 6 (six) hours as needed for wheezing or shortness of breath. 12/29/20  Yes Jacalyn Lefevre, MD  ibuprofen (ADVIL) 600 MG tablet Take 1 tablet (600 mg total) by mouth every 6 (six) hours as needed. 01/21/21  Yes Geral Tuch A, PA-C  methocarbamol (ROBAXIN) 500 MG tablet Take 1 tablet (500 mg total) by mouth 2 (two) times daily. 01/21/21  Yes Smitty Ackerley A, PA-C  predniSONE (DELTASONE) 10 MG tablet Take 2 tablets (20 mg total) by mouth daily for 5 days. 01/21/21 01/26/21 Yes Laporshia Hogen A, PA-C  albuterol (VENTOLIN HFA) 108 (90 Base) MCG/ACT inhaler Inhale 2 puffs into the lungs every 4 (four) hours as needed for wheezing or shortness of breath. Patient not taking: Reported on 01/21/2021 09/29/20   Gilda Crease, MD  doxycycline (VIBRAMYCIN) 100 MG capsule Take 1 capsule (100 mg total) by mouth 2 (two) times daily. Patient not taking: Reported on 01/21/2021 12/06/20   Tilden Fossa, MD    Allergies    Other and Methylprednisolone sodium succ  Review of Systems   Review of Systems  Constitutional:  Negative for chills and fever.  Respiratory:  Positive for cough and wheezing. Negative for shortness of breath.   Cardiovascular:  Negative for chest pain.  Gastrointestinal:  Negative for abdominal pain, nausea and vomiting.  Genitourinary:  Positive for flank pain (Left). Negative for frequency, hematuria and urgency.  Musculoskeletal:  Negative for back pain.  Skin:  Negative for rash.  All other systems reviewed and are negative.  Physical Exam Updated Vital Signs BP 134/77 (BP Location: Right Arm)    Pulse 99    Temp 97.8 F (36.6 C) (Oral)    Resp 20    SpO2 98%   Physical Exam Vitals and nursing note reviewed.  Constitutional:      General: He is not in acute distress.    Appearance: He is not diaphoretic.  HENT:     Head: Normocephalic and atraumatic.     Mouth/Throat:     Pharynx: No oropharyngeal exudate.  Eyes:      General: No scleral icterus.    Conjunctiva/sclera: Conjunctivae normal.  Cardiovascular:     Rate and Rhythm: Normal rate and regular rhythm.     Pulses: Normal pulses.     Heart sounds: Normal heart sounds.  Pulmonary:     Effort: Pulmonary effort is normal. No respiratory distress.     Breath sounds: Wheezing present.     Comments: Inspiratory and expiratory wheezing noted throughout lung fields.   Chest:     Chest wall: No tenderness.  Abdominal:     General: Bowel sounds are normal.     Palpations: Abdomen is soft. There is no mass.     Tenderness: There is no abdominal tenderness. There is left CVA tenderness. There is no right CVA tenderness, guarding or rebound.     Comments: Left CVA TTP. No overlying skin changes.   Musculoskeletal:        General: Normal range of motion.     Cervical back: Normal range of motion and neck supple.     Comments: No C, T, L, S spinal TTP  Skin:    General: Skin is warm and dry.  Neurological:     Mental Status: He is alert.  Psychiatric:        Behavior: Behavior normal.    ED Results / Procedures / Treatments   Labs (all labs ordered are listed, but only abnormal results are displayed) Labs Reviewed  BASIC METABOLIC PANEL - Abnormal; Notable for the following components:      Result Value   Calcium 8.2 (*)    All other components within normal limits  CBC WITH DIFFERENTIAL/PLATELET - Abnormal; Notable for the following components:   Eosinophils Absolute 0.6 (*)    All other components within normal limits  URINALYSIS, ROUTINE W REFLEX MICROSCOPIC - Abnormal; Notable for the following components:   Specific Gravity, Urine >1.030 (*)    Leukocytes,Ua SMALL (*)    All other components within normal limits  URINALYSIS, MICROSCOPIC (REFLEX) - Abnormal; Notable for the following components:   Bacteria, UA RARE (*)    All other components within normal limits  LIPASE, BLOOD    EKG None  Radiology DG Chest 2 View  Result Date:  01/21/2021 CLINICAL DATA:  Cough and shortness of breath for 14 days EXAM: CHEST - 2 VIEW COMPARISON:  12/29/2020 FINDINGS: Normal heart size and mediastinal contours. Chronic mild airway thickening. No acute infiltrate or edema. No effusion or pneumothorax. No acute osseous findings. Healed right mid clavicle fracture. IMPRESSION: Chronic airway thickening.  No acute finding. Electronically Signed   By: Tiburcio PeaJonathan  Watts M.D.   On: 01/21/2021 07:28  CT Renal Stone Study  Result Date: 01/21/2021 CLINICAL DATA:  Flank pain EXAM: CT ABDOMEN AND PELVIS WITHOUT CONTRAST TECHNIQUE: Multidetector CT imaging of the abdomen and pelvis was performed following the standard protocol without IV contrast. COMPARISON:  CT abdomen and pelvis 05/28/2019 FINDINGS: Lower chest: No acute abnormality. Hepatobiliary: Liver is enlarged measuring 19.8 cm in length. 3.7 cm hypodense mass in the posterior aspect of the right hepatic lobe which was previously described as a hemangioma. Gallbladder appears normal. No biliary ductal dilatation. Pancreas: Unremarkable. No pancreatic ductal dilatation or surrounding inflammatory changes. Spleen: Normal in size without focal abnormality. Adrenals/Urinary Tract: Adrenal glands are unremarkable. Kidneys are normal, without renal calculi, focal lesion, or hydronephrosis. Bladder is unremarkable. Stomach/Bowel: Stomach is within normal limits. Appendix appears normal. No evidence of bowel wall thickening, distention, or inflammatory changes. Vascular/Lymphatic: No significant vascular findings are present. No enlarged abdominal or pelvic lymph nodes. Reproductive: Prostate is unremarkable. Other: Small umbilical hernia containing fat. No ascites identified. Musculoskeletal: No suspicious bony lesions. IMPRESSION: 1. No nephrolithiasis or obstructive uropathy. 2. Hepatomegaly. 3.7 cm mass in the posterior right hepatic lobe which was previously described as a hemangioma. Electronically Signed    By: Ofilia Neas M.D.   On: 01/21/2021 12:59    Procedures Procedures   Medications Ordered in ED Medications  albuterol (VENTOLIN HFA) 108 (90 Base) MCG/ACT inhaler 2 puff (2 puffs Inhalation Given 01/21/21 1256)  ipratropium-albuterol (DUONEB) 0.5-2.5 (3) MG/3ML nebulizer solution 3 mL (3 mLs Nebulization Given 01/21/21 1256)  dexamethasone (DECADRON) injection 10 mg (10 mg Intravenous Given 01/21/21 1256)  ketorolac (TORADOL) 30 MG/ML injection 30 mg (30 mg Intravenous Given 01/21/21 1424)  methocarbamol (ROBAXIN) tablet 500 mg (500 mg Oral Given 01/21/21 1424)    ED Course  I have reviewed the triage vital signs and the nursing notes.  Pertinent labs & imaging results that were available during my care of the patient were reviewed by me and considered in my medical decision making (see chart for details).  Clinical Course as of 01/21/21 1534  Mon Jan 21, 2021  1048 Went to evaluate patient, pt not in room. [SB]  1127 Notified RN to inform when patient is in room for evaluation. [SB]  1402 Patient re-evaluated and notified of lab and imaging findings. Lungs CTAB following duoneb and decadron.  Discussed with patient will give Toradol and Robaxin.  Patient agreeable at this time. [SB]  1439 Patient tolerated p.o. challenge. [SB]  1458 Pt resting comfortably on stretcher and noted mild improvement of his symptoms. Discussed with patient discharge treatment plan. Pt agreeable at this time. [SB]  1459 RN notified me that patient tolerated ambulatory pulse ox without exacerbation of symptoms. Pt safe for discharge. [SB]    Clinical Course User Index [SB] Luke Falero A, PA-C   MDM Rules/Calculators/A&P                         Patient presents to the ED with gradual onset, constant, left flank pain x1 month.  Patient's not been evaluated for his symptoms.  Vital signs stable, patient afebrile.  On exam patient with left CVA tenderness otherwise no acute cardiovascular,  respiratory, abdominal findings.  Differential diagnosis includes nephrolithiasis, pyelonephritis, acute cystitis.  CBC, CMP, and lipase unremarkable.  Urinalysis unremarkable.  CT renal stone study negative for acute kidney stone.  Will treat patient with Toradol and Robaxin in the ED. Patient with mild relief of his symptoms with pain regimen in  the ED. Will prescribe robaxin and ibuprofen. Supportive care measures discussed with patient at bedside.  Patient also complains of being out of his albuterol inhaler. On exam patient with audible inspiratory and expiratory wheezing throughout all lung fields.  No cyanosis or accessory muscle use noted.  No sick contacts.  Differential diagnosis includes asthma exacerbation, PNA, or electrolyte abnormalities.  Labs stable, less likely electrolyte etiology. Chest x-ray with chronic airway thickening.  Less likely pneumonia as cause. Patient given Decadron multiple times within the last several months.  Notified patient that if he starts to experience nausea or vomiting to inform me and we can give Zofran ODT.  Patient agreeable at this time. Patient given DuoNeb and Decadron injection in the ED. This is likely asthma exacerbation as cause of symptoms. Following breathing treatment and Decadron, lungs clear to auscultation bilaterally, vital signs stable, no hypoxia.  Patient denies nausea or vomiting following Decadron administration.  Patient reports improvement in symptoms.  Ambulatory pulse ox monitoring shows patient O2 saturation above 94% without wheezing or shortness of breath.  Patient was prescribed prednisone taper on 11/26 and completion of that course on 12/5.   Patient provided with albuterol inhaler in the ED.  Will prescribe prednisone burst. Discussed with the patient to continue use of albuterol inhaler as needed.  Notified patient of [facility] for follow-up of albuterol inhaler prescriptions.  Patient agreeable at this  time.  Patient is hemodynamically stable.  Supportive care and strict return precautions discussed with patient.  Patient acknowledges and verbalizes understanding.    Counseled patient for approximately 10 minutes regarding smoking cessation. Discussed risks of smoking and how they applied and affected their visit here today. Patient not ready to quit at this time, however will follow up with their primary doctor when they are.   CPT code: [phone]: intermediate counseling for smoking cessation  Appears safe for discharge at this time.  Follow-up as indicated discharge paperwork.  Final Clinical Impression(s) / ED Diagnoses Final diagnoses:  Left flank pain  Asthma with acute exacerbation, unspecified asthma severity, unspecified whether persistent    Rx / DC Orders ED Discharge Orders          Ordered    methocarbamol (ROBAXIN) 500 MG tablet  2 times daily        01/21/21 1524    ibuprofen (ADVIL) 600 MG tablet  Every 6 hours PRN        01/21/21 1524    predniSONE (DELTASONE) 10 MG tablet  Daily        01/21/21 1524             Kamala Kolton A, PA-C 01/21/21 1537    Godfrey Pick, MD 01/22/21 [phone]

## 2021-01-21 NOTE — ED Triage Notes (Signed)
Pt states "30 days" of left flank pain, denies urinary symptoms. Also says he has been out of his inhaler for 14 days and feels like he is having wheezing and coughs clear mucous.

## 2021-01-21 NOTE — ED Provider Notes (Signed)
Emergency Medicine Provider Triage Evaluation Note  Keith Henry , a 45 y.o. male  was evaluated in triage.  Pt complains of gradual onset, constant, achy, left flank pain for the past 1 month.  Patient states that he has been dealing with the pain at home however missed the bus to work today and decided to come to the ED instead to address that as well as being out of his albuterol inhaler for the past 2 weeks.  He states that he has been wheezing and coughing as well.  He denies any fevers or chills.  He denies any radiation of flank pain.  He denies any urinary symptoms or nausea or vomiting.  Review of Systems  Positive: + cough, wheezing, flank pain Negative: - nausea, vomiting, urinary symptoms  Physical Exam  BP 130/86 (BP Location: Right Arm)    Pulse 75    Temp 97.8 F (36.6 C) (Oral)    Resp 18    SpO2 98%  Gen:   Awake, no distress   Resp:  Normal effort  MSK:   Moves extremities without difficulty  Other:  + left CVA/flank TTP as well as left lumbar paraspinal musculature TTP  Medical Decision Making  Medically screening exam initiated at 7:10 AM.  Appropriate orders placed.  [name] was informed that the remainder of the evaluation will be completed by another provider, this initial triage assessment does not replace that evaluation, and the importance of remaining in the ED until their evaluation is complete.     Tanda Rockers, PA-C 01/21/21 1062    Milagros Loll, MD 01/21/21 [phone]

## 2021-02-07 ENCOUNTER — Other Ambulatory Visit: Payer: Self-pay

## 2021-02-07 ENCOUNTER — Inpatient Hospital Stay (HOSPITAL_COMMUNITY)
Admission: EM | Admit: 2021-02-07 | Discharge: 2021-02-13 | DRG: 871 | Disposition: A | Discharge status: Home / Self Care | Payer: No Typology Code available for payment source | Attending: Internal Medicine | Admitting: Internal Medicine

## 2021-02-07 DIAGNOSIS — F17210 Nicotine dependence, cigarettes, uncomplicated: Secondary | ICD-10-CM | POA: Diagnosis present

## 2021-02-07 DIAGNOSIS — R8290 Unspecified abnormal findings in urine: Secondary | ICD-10-CM | POA: Diagnosis present

## 2021-02-07 DIAGNOSIS — M25512 Pain in left shoulder: Secondary | ICD-10-CM | POA: Diagnosis present

## 2021-02-07 DIAGNOSIS — Z20822 Contact with and (suspected) exposure to covid-19: Secondary | ICD-10-CM | POA: Diagnosis present

## 2021-02-07 DIAGNOSIS — Z818 Family history of other mental and behavioral disorders: Secondary | ICD-10-CM

## 2021-02-07 DIAGNOSIS — A419 Sepsis, unspecified organism: Principal | ICD-10-CM | POA: Diagnosis present

## 2021-02-07 DIAGNOSIS — Z91018 Allergy to other foods: Secondary | ICD-10-CM

## 2021-02-07 DIAGNOSIS — R6510 Systemic inflammatory response syndrome (SIRS) of non-infectious origin without acute organ dysfunction: Secondary | ICD-10-CM | POA: Diagnosis present

## 2021-02-07 DIAGNOSIS — J45901 Unspecified asthma with (acute) exacerbation: Secondary | ICD-10-CM | POA: Diagnosis present

## 2021-02-07 DIAGNOSIS — Z888 Allergy status to other drugs, medicaments and biological substances status: Secondary | ICD-10-CM

## 2021-02-07 DIAGNOSIS — M25511 Pain in right shoulder: Secondary | ICD-10-CM | POA: Diagnosis present

## 2021-02-07 DIAGNOSIS — F319 Bipolar disorder, unspecified: Secondary | ICD-10-CM | POA: Diagnosis present

## 2021-02-07 DIAGNOSIS — J189 Pneumonia, unspecified organism: Secondary | ICD-10-CM | POA: Diagnosis present

## 2021-02-07 DIAGNOSIS — Z6828 Body mass index (BMI) 28.0-28.9, adult: Secondary | ICD-10-CM

## 2021-02-07 DIAGNOSIS — Z79899 Other long term (current) drug therapy: Secondary | ICD-10-CM

## 2021-02-07 DIAGNOSIS — E663 Overweight: Secondary | ICD-10-CM | POA: Diagnosis present

## 2021-02-07 MED ORDER — ALBUTEROL SULFATE HFA 108 (90 BASE) MCG/ACT IN AERS
4.0000 | INHALATION_SPRAY | Freq: Once | RESPIRATORY_TRACT | Status: AC
  Administered 2021-02-07: 4 via RESPIRATORY_TRACT
  Filled 2021-02-07: qty 6.7

## 2021-02-07 MED ORDER — ACETAMINOPHEN 500 MG PO TABS
1000.0000 mg | ORAL_TABLET | Freq: Once | ORAL | Status: AC
  Administered 2021-02-07: 1000 mg via ORAL
  Filled 2021-02-07: qty 2

## 2021-02-07 NOTE — ED Triage Notes (Signed)
Pt reports three days of sob and cough. C/o chest wall and back pain when moving. Hx astma. Pt arguing with triage RN that he does not have covid and does not want to be swabbed for covid or the flu. Pt continuing to be rude through triage process and refusing to sign MSE waiver.

## 2021-02-07 NOTE — ED Notes (Signed)
Called for vitals no answer °

## 2021-02-07 NOTE — ED Notes (Addendum)
Pt stated that he was coughing up mucous and wanted to see the doctor. Pt had refused all testing. Pt was being rude and disruptive in the waiting room. Pt was informed that he has to wait for a room. Pt was offered and emesis bag to spit in and he refused. Pt then stated that he wanted a breathing treatment for his asthma. Pt was informed that he would need to see the nurse or doctor and he continued to be rude and disrespectful to staff.

## 2021-02-08 ENCOUNTER — Encounter (HOSPITAL_COMMUNITY): Payer: Self-pay | Admitting: Internal Medicine

## 2021-02-08 ENCOUNTER — Emergency Department (HOSPITAL_COMMUNITY): Payer: No Typology Code available for payment source

## 2021-02-08 DIAGNOSIS — R6510 Systemic inflammatory response syndrome (SIRS) of non-infectious origin without acute organ dysfunction: Secondary | ICD-10-CM | POA: Diagnosis present

## 2021-02-08 DIAGNOSIS — J45901 Unspecified asthma with (acute) exacerbation: Secondary | ICD-10-CM

## 2021-02-08 LAB — CBC WITH DIFFERENTIAL/PLATELET
Abs Immature Granulocytes: 0 10*3/uL (ref 0.00–0.07)
Band Neutrophils: 10 %
Basophils Absolute: 0 10*3/uL (ref 0.0–0.1)
Basophils Relative: 0 %
Eosinophils Absolute: 0 10*3/uL (ref 0.0–0.5)
Eosinophils Relative: 0 %
HCT: 41.4 % (ref 39.0–52.0)
Hemoglobin: 13.6 g/dL (ref 13.0–17.0)
Lymphocytes Relative: 4 %
Lymphs Abs: 1.1 10*3/uL (ref 0.7–4.0)
MCH: 32.4 pg (ref 26.0–34.0)
MCHC: 32.9 g/dL (ref 30.0–36.0)
MCV: 98.6 fL (ref 80.0–100.0)
Monocytes Absolute: 1.1 10*3/uL — ABNORMAL HIGH (ref 0.1–1.0)
Monocytes Relative: 4 %
Neutro Abs: 25.9 10*3/uL — ABNORMAL HIGH (ref 1.7–7.7)
Neutrophils Relative %: 82 %
Platelets: 296 10*3/uL (ref 150–400)
RBC: 4.2 MIL/uL — ABNORMAL LOW (ref 4.22–5.81)
RDW: 12.6 % (ref 11.5–15.5)
WBC: 28.2 10*3/uL — ABNORMAL HIGH (ref 4.0–10.5)
nRBC: 0 % (ref 0.0–0.2)
nRBC: 0 /100 WBC

## 2021-02-08 LAB — CBC
HCT: 39.6 % (ref 39.0–52.0)
Hemoglobin: 13 g/dL (ref 13.0–17.0)
MCH: 32.5 pg (ref 26.0–34.0)
MCHC: 32.8 g/dL (ref 30.0–36.0)
MCV: 99 fL (ref 80.0–100.0)
Platelets: 289 10*3/uL (ref 150–400)
RBC: 4 MIL/uL — ABNORMAL LOW (ref 4.22–5.81)
RDW: 12.6 % (ref 11.5–15.5)
WBC: 27 10*3/uL — ABNORMAL HIGH (ref 4.0–10.5)
nRBC: 0 % (ref 0.0–0.2)

## 2021-02-08 LAB — COMPREHENSIVE METABOLIC PANEL
ALT: 13 U/L (ref 0–44)
AST: 17 U/L (ref 15–41)
Albumin: 3.2 g/dL — ABNORMAL LOW (ref 3.5–5.0)
Alkaline Phosphatase: 89 U/L (ref 38–126)
Anion gap: 10 (ref 5–15)
BUN: 11 mg/dL (ref 6–20)
CO2: 24 mmol/L (ref 22–32)
Calcium: 8.3 mg/dL — ABNORMAL LOW (ref 8.9–10.3)
Chloride: 100 mmol/L (ref 98–111)
Creatinine, Ser: 1.18 mg/dL (ref 0.61–1.24)
GFR, Estimated: >60 mL/min (ref >60)
Glucose, Bld: 119 mg/dL — ABNORMAL HIGH (ref 70–99)
Potassium: 3.8 mmol/L (ref 3.5–5.1)
Sodium: 134 mmol/L — ABNORMAL LOW (ref 135–145)
Total Bilirubin: 2 mg/dL — ABNORMAL HIGH (ref 0.3–1.2)
Total Protein: 6.1 g/dL — ABNORMAL LOW (ref 6.5–8.1)

## 2021-02-08 LAB — I-STAT VENOUS BLOOD GAS, ED
Acid-Base Excess: 0 mmol/L (ref 0.0–2.0)
Bicarbonate: 23.6 mmol/L (ref 20.0–28.0)
Calcium, Ion: 1.06 mmol/L — ABNORMAL LOW (ref 1.15–1.40)
HCT: 39 % (ref 39.0–52.0)
Hemoglobin: 13.3 g/dL (ref 13.0–17.0)
O2 Saturation: 93 %
Patient temperature: 37
Potassium: 3.8 mmol/L (ref 3.5–5.1)
Sodium: 135 mmol/L (ref 135–145)
TCO2: 25 mmol/L (ref 22–32)
pCO2, Ven: 36 mmHg — ABNORMAL LOW (ref 44.0–60.0)
pH, Ven: 7.425 (ref 7.250–7.430)
pO2, Ven: 66 mmHg — ABNORMAL HIGH (ref 32.0–45.0)

## 2021-02-08 LAB — URINALYSIS, ROUTINE W REFLEX MICROSCOPIC
Bilirubin Urine: NEGATIVE
Glucose, UA: NEGATIVE mg/dL
Hgb urine dipstick: NEGATIVE
Ketones, ur: NEGATIVE mg/dL
Nitrite: NEGATIVE
Protein, ur: NEGATIVE mg/dL
Specific Gravity, Urine: 1.024 (ref 1.005–1.030)
pH: 5 (ref 5.0–8.0)

## 2021-02-08 LAB — LACTIC ACID, PLASMA: Lactic Acid, Venous: 1.5 mmol/L (ref 0.5–1.9)

## 2021-02-08 LAB — CREATININE, SERUM
Creatinine, Ser: 1.42 mg/dL — ABNORMAL HIGH (ref 0.61–1.24)
GFR, Estimated: >60 mL/min (ref >60)

## 2021-02-08 LAB — RESP PANEL BY RT-PCR (FLU A&B, COVID) ARPGX2
Influenza A by PCR: NEGATIVE
Influenza B by PCR: NEGATIVE
SARS Coronavirus 2 by RT PCR: NEGATIVE

## 2021-02-08 LAB — HIV ANTIBODY (ROUTINE TESTING W REFLEX): HIV Screen 4th Generation wRfx: NONREACTIVE

## 2021-02-08 LAB — TROPONIN I (HIGH SENSITIVITY)
Troponin I (High Sensitivity): 12 ng/L (ref <18)
Troponin I (High Sensitivity): 6 ng/L (ref <18)

## 2021-02-08 MED ORDER — METHYLPREDNISOLONE SODIUM SUCC 125 MG IJ SOLR: 80.0000 mg | INTRAMUSCULAR | Status: DC

## 2021-02-08 MED ORDER — ALBUTEROL SULFATE (2.5 MG/3ML) 0.083% IN NEBU: 10.0000 mg/h | INHALATION_SOLUTION | Freq: Once | RESPIRATORY_TRACT | Status: AC

## 2021-02-08 MED ORDER — IPRATROPIUM-ALBUTEROL 0.5-2.5 (3) MG/3ML IN SOLN
3.0000 mL | RESPIRATORY_TRACT | Status: DC
  Administered 2021-02-09 (×3): 3 mL via RESPIRATORY_TRACT
  Filled 2021-02-08 (×4): qty 3

## 2021-02-08 MED ORDER — LACTATED RINGERS IV SOLN: INTRAVENOUS | Status: DC

## 2021-02-08 MED ORDER — METHYLPREDNISOLONE SODIUM SUCC 125 MG IJ SOLR
80.0000 mg | INTRAMUSCULAR | Status: DC
  Administered 2021-02-09: 80 mg via INTRAVENOUS
  Filled 2021-02-08: qty 2

## 2021-02-08 MED ORDER — METHYLPREDNISOLONE SODIUM SUCC 125 MG IJ SOLR
125.0000 mg | Freq: Once | INTRAMUSCULAR | Status: AC
  Administered 2021-02-08: 125 mg via INTRAVENOUS
  Filled 2021-02-08: qty 2

## 2021-02-08 MED ORDER — MAGNESIUM SULFATE 2 GM/50ML IV SOLN
2.0000 g | Freq: Once | INTRAVENOUS | Status: AC
  Administered 2021-02-08: 2 g via INTRAVENOUS
  Filled 2021-02-08: qty 50

## 2021-02-08 MED ORDER — TRAMADOL HCL 50 MG PO TABS
50.0000 mg | ORAL_TABLET | Freq: Four times a day (QID) | ORAL | Status: DC | PRN
  Administered 2021-02-08 (×2): 50 mg via ORAL
  Filled 2021-02-08 (×2): qty 1

## 2021-02-08 MED ORDER — BUDESONIDE 0.25 MG/2ML IN SUSP
0.2500 mg | Freq: Two times a day (BID) | RESPIRATORY_TRACT | Status: DC
  Administered 2021-02-08 – 2021-02-13 (×10): 0.25 mg via RESPIRATORY_TRACT
  Filled 2021-02-08 (×12): qty 2

## 2021-02-08 MED ORDER — KETOROLAC TROMETHAMINE 15 MG/ML IJ SOLN
15.0000 mg | Freq: Once | INTRAMUSCULAR | Status: AC
  Administered 2021-02-08: 15 mg via INTRAVENOUS
  Filled 2021-02-08: qty 1

## 2021-02-08 MED ORDER — LACTATED RINGERS IV BOLUS
1000.0000 mL | Freq: Once | INTRAVENOUS | Status: AC
  Administered 2021-02-08: 1000 mL via INTRAVENOUS

## 2021-02-08 MED ORDER — ACETAMINOPHEN 325 MG PO TABS
650.0000 mg | ORAL_TABLET | Freq: Four times a day (QID) | ORAL | Status: DC | PRN
  Administered 2021-02-08: 650 mg via ORAL
  Filled 2021-02-08: qty 2

## 2021-02-08 MED ORDER — ALBUTEROL SULFATE (2.5 MG/3ML) 0.083% IN NEBU
INHALATION_SOLUTION | RESPIRATORY_TRACT | Status: AC
  Administered 2021-02-08: 10 mg/h via RESPIRATORY_TRACT
  Filled 2021-02-08: qty 3

## 2021-02-08 MED ORDER — ALBUTEROL SULFATE (2.5 MG/3ML) 0.083% IN NEBU
2.5000 mg | INHALATION_SOLUTION | RESPIRATORY_TRACT | Status: DC
  Administered 2021-02-08 (×4): 2.5 mg via RESPIRATORY_TRACT
  Filled 2021-02-08 (×4): qty 3

## 2021-02-08 MED ORDER — SODIUM CHLORIDE 0.9 % IV SOLN
2.0000 g | INTRAVENOUS | Status: DC
  Administered 2021-02-09 – 2021-02-12 (×4): 2 g via INTRAVENOUS
  Filled 2021-02-08 (×4): qty 20

## 2021-02-08 MED ORDER — ALBUTEROL SULFATE (2.5 MG/3ML) 0.083% IN NEBU
2.5000 mg | INHALATION_SOLUTION | Freq: Once | RESPIRATORY_TRACT | Status: AC
Start: 2021-02-08 — End: 2021-02-08
  Administered 2021-02-08: 2.5 mg via RESPIRATORY_TRACT
  Filled 2021-02-08: qty 3

## 2021-02-08 MED ORDER — ACETAMINOPHEN 650 MG RE SUPP: 650.0000 mg | Freq: Four times a day (QID) | RECTAL | Status: DC | PRN

## 2021-02-08 MED ORDER — SODIUM CHLORIDE 0.9 % IV SOLN
2.0000 g | Freq: Once | INTRAVENOUS | Status: AC
  Administered 2021-02-08: 2 g via INTRAVENOUS
  Filled 2021-02-08: qty 20

## 2021-02-08 MED ORDER — SODIUM CHLORIDE 0.9 % IV SOLN
500.0000 mg | INTRAVENOUS | Status: DC
  Administered 2021-02-08: 500 mg via INTRAVENOUS
  Filled 2021-02-08 (×2): qty 5

## 2021-02-08 MED ORDER — ALBUTEROL SULFATE (2.5 MG/3ML) 0.083% IN NEBU
10.0000 mg/h | INHALATION_SOLUTION | Freq: Once | RESPIRATORY_TRACT | Status: AC
  Administered 2021-02-08: 10 mg/h via RESPIRATORY_TRACT
  Filled 2021-02-08: qty 12

## 2021-02-08 MED ORDER — IPRATROPIUM BROMIDE 0.02 % IN SOLN
0.5000 mg | RESPIRATORY_TRACT | Status: DC
  Administered 2021-02-08 (×4): 0.5 mg via RESPIRATORY_TRACT
  Filled 2021-02-08 (×4): qty 2.5

## 2021-02-08 MED ORDER — ALBUTEROL SULFATE (2.5 MG/3ML) 0.083% IN NEBU
2.5000 mg | INHALATION_SOLUTION | RESPIRATORY_TRACT | Status: DC | PRN
  Administered 2021-02-13: 2.5 mg via RESPIRATORY_TRACT
  Filled 2021-02-08 (×2): qty 3

## 2021-02-08 MED ORDER — ENOXAPARIN SODIUM 40 MG/0.4ML IJ SOSY
40.0000 mg | PREFILLED_SYRINGE | INTRAMUSCULAR | Status: DC
  Administered 2021-02-08 – 2021-02-12 (×4): 40 mg via SUBCUTANEOUS
  Filled 2021-02-08 (×4): qty 0.4

## 2021-02-08 MED ORDER — SODIUM CHLORIDE 0.9 % IV SOLN: INTRAVENOUS | Status: DC

## 2021-02-08 MED ORDER — ONDANSETRON HCL 4 MG/2ML IJ SOLN
4.0000 mg | Freq: Once | INTRAMUSCULAR | Status: AC | PRN
  Administered 2021-02-08: 4 mg via INTRAVENOUS
  Filled 2021-02-08: qty 2

## 2021-02-08 NOTE — Plan of Care (Signed)

## 2021-02-08 NOTE — ED Notes (Signed)
Troponin due at 0559 not yet drawn and another is already due at 0759. Order modified to reflect the following times: [place]; [phone].

## 2021-02-08 NOTE — ED Notes (Signed)
Pt given supplies to given himself a bedside bath. Pt temporarily taken off of the monitor and IVF on pause at this time.

## 2021-02-08 NOTE — Progress Notes (Signed)
PROGRESS NOTE  Keith Henry  DOB: April 05, 1975  PCP: Aviva Kluver YDX:[id number]  DOA: 02/07/2021  LOS: 0 days  Hospital Day: 2  Chief Complaint  Patient presents with   Shortness of Breath   Cough   Brief narrative: Keith Henry is a 46 y.o. male with PMH significant for asthma and tobacco abuse. Patient presented to the ED with complaint of grossly worsening shortness of breath for 3 weeks despite using inhalers.   In the ED, patient had a fever up to 102.8, heart rate elevated to 131, breathing on room air, he was found to be diffusely wheezing. Blood gas with pH normal, PCO2 36, serum bicarb 24, WBC count elevated to 28.2, lactic acid 1.5. Urinalysis with hazy, trace leukocytes, rare bacteria. Chest x-ray with chronic bronchial thickening. Blood culture sent.  Broad-spectrum antibiotics started.  Subjective: Patient was seen and examined this morning.  Middle-aged African-American male.  Lying on bed.  Not in distress continues to have scattered bilateral wheezing.  Also complains of a lot of pain in his shoulders T-max 102.8 last night, this morning, heart rate in 90s,  Assessment/Plan: Sepsis -Presented with fever, tachycardia, tachypnea, diffuse wheezing, leukocytosis. -Atypical pneumonia versus bronchitis. -Blood cultures sent, broad-spectrum IV antibiotics started. -Also started on IV steroids and bronchodilators.  Currently on Solu-Medrol IV 80 mg daily.  Wheezing seems to be improving.  Continue the same steroids for now.  Bilateral shoulder pain/rib pain -At the time of my evaluation, patient is having significant bilateral shoulder pain, 'pain in the ribs'.  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 -He was given Toradol in the ED.  I will start him on Tylenol and tramadol.  Abnormal urinalysis -Hazy yellow urine with trace leukocytes and rare bacteria.  -Denies any urinary symptoms.  Chronic smoking -Counseled to quit.    Mobility: Encourage ambulation Living condition: Lives at home Goals of care:   Code Status: Full Code  Nutritional status: There is no height or weight on file to calculate BMI.      Diet:  Diet Order             Diet regular Room service appropriate? Yes; Fluid consistency: Thin  Diet effective now                  DVT prophylaxis:  enoxaparin (LOVENOX) injection 40 mg Start: 02/08/21 1800   Antimicrobials: IV Rocephin, azithromycin Fluid: LR@100  mill per hour Consultants: None Family Communication: None at bedside  Status is: Observation  Continue in-hospital care because: Continues to have pain, wheezing, on IV steroids Level of care: Telemetry Medical   Dispo: The patient is from: Home              Anticipated d/c is to: Home in 1 to 2 days              Patient currently is not medically stable to d/c.   Difficult to place patient No     Infusions:   azithromycin Stopped (02/08/21 0924)   cefTRIAXone (ROCEPHIN)  IV     lactated ringers 100 mL/hr at 02/08/21 0750    Scheduled Meds:  albuterol  2.5 mg Nebulization Q4H   budesonide (PULMICORT) nebulizer solution  0.25 mg Nebulization BID   enoxaparin (LOVENOX) injection  40 mg Subcutaneous Q24H   ipratropium  0.5 mg Nebulization Q4H   methylPREDNISolone (SOLU-MEDROL) injection  80 mg Intravenous Q24H    PRN meds: acetaminophen **OR**  acetaminophen, albuterol, traMADol   Antimicrobials: Anti-infectives (From admission, onward)    Start     Dose/Rate Route Frequency Ordered Stop   02/08/21 0800  azithromycin (ZITHROMAX) 500 mg in sodium chloride 0.9 % 250 mL IVPB        500 mg 250 mL/hr over 60 Minutes Intravenous Every 24 hours 02/08/21 0558 02/13/21 0759   02/08/21 0600  cefTRIAXone (ROCEPHIN) 2 g in sodium chloride 0.9 % 100 mL IVPB        2 g 200 mL/hr over 30 Minutes Intravenous Every 24 hours 02/08/21 0558 02/13/21 0359   02/08/21 0400  cefTRIAXone (ROCEPHIN) 2 g in sodium chloride 0.9 %  100 mL IVPB        2 g 200 mL/hr over 30 Minutes Intravenous  Once 02/08/21 0347 02/08/21 0513       Objective: Vitals:   02/08/21 0845 02/08/21 1015  BP: 135/85 131/82  Pulse: (!) 103 (!) 102  Resp: 19 (!) 21  Temp:    SpO2: 98% 97%    Intake/Output Summary (Last 24 hours) at 02/08/2021 1243 Last data filed at 02/08/2021 0513 Gross per 24 hour  Intake 1100 ml  Output --  Net 1100 ml   There were no vitals filed for this visit. Weight change:  There is no height or weight on file to calculate BMI.   Physical Exam: General exam: Middle-aged African-American male.  In distress because of bilateral shoulder pain,  Skin: No rashes, lesions or ulcers. HEENT: Atraumatic, normocephalic, no obvious bleeding Lungs: Mild diffuse bilateral wheezing.  Mild tenderness on palpation of chest wall on both sides CVS: Clear to auscultation bilaterally GI/Abd soft, nontender, nondistended, bowel sound present CNS: Alert, awake, oriented x3 Psychiatry: Mood appropriate Extremities: No pedal edema, no calf tenderness  Data Review: I have personally reviewed the laboratory data and studies available.  F/u labs ordered Unresulted Labs (From admission, onward)     Start     Ordered   02/15/21 0500  Creatinine, serum  (enoxaparin (LOVENOX)    CrCl >/= 30 ml/min)  Weekly,   R     Comments: while on enoxaparin therapy    02/08/21 0558   02/09/21 0500  CBC with Differential/Platelet  Tomorrow morning,   R        02/08/21 1243   02/09/21 0500  Basic metabolic panel  Tomorrow morning,   R        02/08/21 1243   02/08/21 0558  Urine Culture  Once,   R       Question:  Indication  Answer:  Dysuria   02/08/21 0558   02/08/21 0556  Expectorated Sputum Assessment w Gram Stain, Rflx to Resp Cult  Once,   R        02/08/21 0558   02/08/21 0348  Blood culture (routine x 2)  BLOOD CULTURE X 2,   STAT      02/08/21 0347   02/08/21 0248  Urine Culture  Once,   STAT       Question:  Indication   Answer:  Flank Pain   02/08/21 0247            Signed, Lorin Glass, MD [facility] 02/08/2021

## 2021-02-08 NOTE — H&P (Signed)
History and Physical    Keith Henry:[id number] DOB: 1975-09-26 DOA: 02/07/2021  PCP: Pcp, No  Patient coming from: Home.  Chief Complaint: Shortness of breath.  HPI: Keith Henry is a 46 y.o. male with history of asthma and tobacco abuse presents to the ER because of ongoing shortness of breath for the last 3 weeks.  Patient states he has asthma and has been using albuterol despite which patient's shortness of breath has not been improving.  Has been having some productive cough.  Denies any abdominal pain nausea vomiting diarrhea or chest pain.  ED Course: In the ER patient was febrile tachycardic with leukocytosis chest x-ray does not show anything acute.  Patient is found to be diffusely wheezing.  Patient had SIRS picture at presentation with UA showing possibility of UTI.  Had blood cultures drawn started on empiric antibiotics nebulizer steroids and admitted for further work-up.  COVID test and influenza were negative.  Review of Systems: As per HPI, rest all negative.   Past Medical History:  Diagnosis Date   Asthma     Past Surgical History:  Procedure Laterality Date   ANKLE SURGERY     SKIN GRAFT     SKULL FRACTURE ELEVATION     pt unsure of specifics     reports that he quit smoking about 4 months ago. His smoking use included cigarettes. He smoked an average of 2 packs per day. He has never used smokeless tobacco. He reports current alcohol use of about 2.0 standard drinks per week. He reports current drug use. Drug: Marijuana.  Allergies  Allergen Reactions   Other Anaphylaxis    Pork   Methylprednisolone Sodium Succ Nausea And Vomiting    History reviewed. No pertinent family history.  Prior to Admission medications   Medication Sig Start Date End Date Taking? Authorizing Provider  albuterol (VENTOLIN HFA) 108 (90 Base) MCG/ACT inhaler Inhale 1-2 puffs into the lungs every 6 (six) hours as needed for wheezing or shortness of breath. 12/29/20  Yes  Jacalyn Lefevre, MD  albuterol (VENTOLIN HFA) 108 (90 Base) MCG/ACT inhaler Inhale 2 puffs into the lungs every 4 (four) hours as needed for wheezing or shortness of breath. Patient not taking: Reported on 01/21/2021 09/29/20   Gilda Crease, MD  doxycycline (VIBRAMYCIN) 100 MG capsule Take 1 capsule (100 mg total) by mouth 2 (two) times daily. Patient not taking: Reported on 01/21/2021 12/06/20   Tilden Fossa, MD  ibuprofen (ADVIL) 600 MG tablet Take 1 tablet (600 mg total) by mouth every 6 (six) hours as needed. Patient not taking: Reported on 02/08/2021 01/21/21   Blue, Soijett A, PA-C  methocarbamol (ROBAXIN) 500 MG tablet Take 1 tablet (500 mg total) by mouth 2 (two) times daily. Patient not taking: Reported on 02/08/2021 01/21/21   Blue, Soijett A, PA-C    Physical Exam: Constitutional: Moderately built and nourished. Vitals:   02/07/21 2352 02/08/21 0214 02/08/21 0230 02/08/21 0400  BP: 107/70  108/64 123/80  Pulse: (!) 131  (!) 116 (!) 103  Resp: (!) 22  20 (!) 26  Temp: (!) 102.8 F (39.3 C)  99.5 F (37.5 C)   TempSrc: Oral  Oral   SpO2: 95% 100% 99% 96%   Eyes: Anicteric no pallor. ENMT: No discharge from the ears eyes nose and mouth. Neck: No mass felt.  No neck rigidity.  No JVD appreciated. Respiratory: Bilateral expiratory wheeze and no crepitations. Cardiovascular: S1-S2 heard. Abdomen: Soft nontender bowel sound present.  Musculoskeletal: No edema. Skin: No rash. Neurologic: Alert awake oriented to time place and person.  Moves all extremities. Psychiatric: Appears normal.  Normal affect.   Labs on Admission: I have personally reviewed following labs and imaging studies  CBC: Recent Labs  Lab 02/08/21 0300 02/08/21 0437  WBC 28.2*  --   NEUTROABS 25.9*  --   HGB 13.6 13.3  HCT 41.4 39.0  MCV 98.6  --   PLT 296  --    Basic Metabolic Panel: Recent Labs  Lab 02/08/21 0300 02/08/21 0437  NA 134* 135  K 3.8 3.8  CL 100  --   CO2 24  --    GLUCOSE 119*  --   BUN 11  --   CREATININE 1.18  --   CALCIUM 8.3*  --    GFR: CrCl cannot be calculated (Unknown ideal weight.). Liver Function Tests: Recent Labs  Lab 02/08/21 0300  AST 17  ALT 13  ALKPHOS 89  BILITOT 2.0*  PROT 6.1*  ALBUMIN 3.2*   No results for input(s): LIPASE, AMYLASE in the last 168 hours. No results for input(s): AMMONIA in the last 168 hours. Coagulation Profile: No results for input(s): INR, PROTIME in the last 168 hours. Cardiac Enzymes: No results for input(s): CKTOTAL, CKMB, CKMBINDEX, TROPONINI in the last 168 hours. BNP (last 3 results) No results for input(s): PROBNP in the last 8760 hours. HbA1C: No results for input(s): HGBA1C in the last 72 hours. CBG: No results for input(s): GLUCAP in the last 168 hours. Lipid Profile: No results for input(s): CHOL, HDL, LDLCALC, TRIG, CHOLHDL, LDLDIRECT in the last 72 hours. Thyroid Function Tests: No results for input(s): TSH, T4TOTAL, FREET4, T3FREE, THYROIDAB in the last 72 hours. Anemia Panel: No results for input(s): VITAMINB12, FOLATE, FERRITIN, TIBC, IRON, RETICCTPCT in the last 72 hours. Urine analysis:    Component Value Date/Time   COLORURINE YELLOW 02/08/2021 0248   APPEARANCEUR HAZY (A) 02/08/2021 0248   LABSPEC 1.024 02/08/2021 0248   PHURINE 5.0 02/08/2021 0248   GLUCOSEU NEGATIVE 02/08/2021 0248   HGBUR NEGATIVE 02/08/2021 0248   BILIRUBINUR NEGATIVE 02/08/2021 0248   KETONESUR NEGATIVE 02/08/2021 0248   PROTEINUR NEGATIVE 02/08/2021 0248   NITRITE NEGATIVE 02/08/2021 0248   LEUKOCYTESUR TRACE (A) 02/08/2021 0248   Sepsis Labs: @LABRCNTIP (procalcitonin:4,lacticidven:4) ) Recent Results (from the past 240 hour(s))  Resp Panel by RT-PCR (Flu A&B, Covid) Nasopharyngeal Swab     Status: None   Collection Time: 02/07/21 12:12 AM   Specimen: Nasopharyngeal Swab; Nasopharyngeal(NP) swabs in vial transport medium  Result Value Ref Range Status   SARS Coronavirus 2 by RT PCR  NEGATIVE NEGATIVE Final    Comment: (NOTE) SARS-CoV-2 target nucleic acids are NOT DETECTED.  The SARS-CoV-2 RNA is generally detectable in upper respiratory specimens during the acute phase of infection. The lowest concentration of SARS-CoV-2 viral copies this assay can detect is 138 copies/mL. A negative result does not preclude SARS-Cov-2 infection and should not be used as the sole basis for treatment or other patient management decisions. A negative result may occur with  improper specimen collection/handling, submission of specimen other than nasopharyngeal swab, presence of viral mutation(s) within the areas targeted by this assay, and inadequate number of viral copies(<138 copies/mL). A negative result must be combined with clinical observations, patient history, and epidemiological information. The expected result is Negative.  Fact Sheet for Patients:  04/07/21  Fact Sheet for Healthcare Providers:  BloggerCourse.com  This test is no t yet approved or  cleared by the [place] FDA and  has been authorized for detection and/or diagnosis of SARS-CoV-2 by FDA under an Emergency Use Authorization (EUA). This EUA will remain  in effect (meaning this test can be used) for the duration of the COVID-19 declaration under Section 564(b)(1) of the Act, 21 U.S.C.section 360bbb-3(b)(1), unless the authorization is terminated  or revoked sooner.       Influenza A by PCR NEGATIVE NEGATIVE Final   Influenza B by PCR NEGATIVE NEGATIVE Final    Comment: (NOTE) The Xpert Xpress SARS-CoV-2/FLU/RSV plus assay is intended as an aid in the diagnosis of influenza from Nasopharyngeal swab specimens and should not be used as a sole basis for treatment. Nasal washings and aspirates are unacceptable for Xpert Xpress SARS-CoV-2/FLU/RSV testing.  Fact Sheet for Patients: BloggerCourse.comhttps://www.fda.gov/media/152166/download  Fact Sheet for  Healthcare Providers: SeriousBroker.ithttps://www.fda.gov/media/152162/download  This test is not yet approved or cleared by the [place] FDA and has been authorized for detection and/or diagnosis of SARS-CoV-2 by FDA under an Emergency Use Authorization (EUA). This EUA will remain in effect (meaning this test can be used) for the duration of the COVID-19 declaration under Section 564(b)(1) of the Act, 21 U.S.C. section 360bbb-3(b)(1), unless the authorization is terminated or revoked.  Performed at [facility], [address] [place], [place] [address]      Radiological Exams on Admission: DG Chest Port 1 View  Result Date: 02/08/2021 CLINICAL DATA:  Cough and shortness of breath. EXAM: PORTABLE CHEST 1 VIEW COMPARISON:  01/21/2021, chest CT 12/06/2020 FINDINGS: The cardiomediastinal contours are normal. Chronic bronchial thickening, unchanged. Pulmonary vasculature is normal. No consolidation, pleural effusion, or pneumothorax. No acute osseous abnormalities are seen. IMPRESSION: Chronic bronchial thickening. No acute abnormality. Electronically Signed   By: Narda RutherfordMelanie  Sanford M.D.   On: 02/08/2021 02:02    EKG: Independently reviewed.  Sinus tachycardia.  Assessment/Plan Principal Problem:   SIRS (systemic inflammatory response syndrome) (HCC) Active Problems:   Asthma exacerbation    SIRS likely could be from bronchitis asthma and possible UTI. Asthma exacerbation for which patient is placed on IV Solu-Medrol Pulmicort nebulizer closely follow respiratory status.  Advised about quitting smoking.  Since patient also has productive cough has been placed on antibiotics Possible UTI will get urine culture follow blood cultures continue with antibiotics. Tobacco abuse advised about quitting.   DVT prophylaxis: Lovenox. Code Status: Full code. Family Communication: Discussed with patient. Disposition Plan: Home. Consults called: None. Admission status: Observation.   Eduard ClosArshad N  Grover Woodfield MD [facility] Pager [phone].  If 7PM-7AM, please contact night-coverage [email or web address] Password [email or web address]  02/08/2021, 5:58 AM

## 2021-02-08 NOTE — ED Notes (Signed)
Tele tracking placed 

## 2021-02-08 NOTE — ED Provider Notes (Signed)
[facility] EMERGENCY DEPARTMENT Provider Note   CSN: [id number] Arrival date & time: 02/07/21  1521     History  Chief Complaint  Patient presents with   Shortness of Breath   Cough    Keith Henry is a 46 y.o. male with past medical history of asthma.  Presents to the emergency department with a chief plaint of shortness of breath, cough, and left flank pain.  Patient reports that he started developing cough on Monday.  States that cough is producing clear mucus.  States that he does not normally have a cough at baseline.  Patient states that his asthma started becoming worse on Wednesday night.  Wheezing and chest tightness have gotten progressively worse since then.  Patient states that he has been using his prescribed albuterol inhaler with no improvement in his symptoms.  Patient states that today he started feeling warm and found out that he had a fever upon arrival in the emergency department.  Patient reports that he has had left flank pain over the past 6 to 8 weeks.  Pain is located to the left flank.  Pain is gotten progressively worse over time.  Pain is worse with touch.  Patient has tried OTC meds with minimal improvement in his symptoms.  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  Patient denies any known sick contacts.  Patient endorses cigarette smoking.   Shortness of Breath Associated symptoms: cough and wheezing   Associated symptoms: no abdominal pain, no chest pain, no fever, no headaches, no neck pain, no rash, no sore throat and no vomiting   Cough Associated symptoms: shortness of breath and wheezing   Associated symptoms: no chest pain, no chills, no fever, no headaches, no rash, no rhinorrhea and no sore throat       Home Medications Prior to Admission medications   Medication Sig Start Date End Date Taking? Authorizing Provider  albuterol (VENTOLIN HFA) 108 (90 Base) MCG/ACT inhaler Inhale 2  puffs into the lungs every 4 (four) hours as needed for wheezing or shortness of breath. Patient not taking: Reported on 01/21/2021 09/29/20   Orpah Greek, MD  albuterol (VENTOLIN HFA) 108 (90 Base) MCG/ACT inhaler Inhale 1-2 puffs into the lungs every 6 (six) hours as needed for wheezing or shortness of breath. 12/29/20   Isla Pence, MD  doxycycline (VIBRAMYCIN) 100 MG capsule Take 1 capsule (100 mg total) by mouth 2 (two) times daily. Patient not taking: Reported on 01/21/2021 12/06/20   Quintella Reichert, MD  ibuprofen (ADVIL) 600 MG tablet Take 1 tablet (600 mg total) by mouth every 6 (six) hours as needed. 01/21/21   Blue, Soijett A, PA-C  methocarbamol (ROBAXIN) 500 MG tablet Take 1 tablet (500 mg total) by mouth 2 (two) times daily. 01/21/21   Blue, Soijett A, PA-C      Allergies    Other and Methylprednisolone sodium succ    Review of Systems   Review of Systems  Constitutional:  Negative for chills and fever.  HENT:  Negative for congestion, rhinorrhea and sore throat.   Eyes:  Negative for visual disturbance.  Respiratory:  Positive for cough, chest tightness, shortness of breath and wheezing.   Cardiovascular:  Negative for chest pain, palpitations and leg swelling.  Gastrointestinal:  Negative for abdominal pain, nausea and vomiting.  Genitourinary:  Positive for flank pain. Negative for difficulty urinating, dysuria, frequency, hematuria, penile swelling, scrotal swelling, testicular  pain and urgency.  Musculoskeletal:  Negative for back pain and neck pain.  Skin:  Negative for color change and rash.  Neurological:  Negative for dizziness, syncope, light-headedness and headaches.  Psychiatric/Behavioral:  Negative for confusion.    Physical Exam Updated Vital Signs BP 108/64 (BP Location: Right Arm)    Pulse (!) 116    Temp 99.5 F (37.5 C) (Oral)    Resp 20    SpO2 99%  Physical Exam Vitals and nursing note reviewed.  Constitutional:      General: He is not  in acute distress.    Appearance: He is not ill-appearing, toxic-appearing or diaphoretic.  HENT:     Head: Normocephalic.  Eyes:     General: No scleral icterus.       Right eye: No discharge.        Left eye: No discharge.  Cardiovascular:     Rate and Rhythm: Tachycardia present.  Pulmonary:     Effort: Pulmonary effort is normal. No tachypnea, bradypnea or respiratory distress.     Breath sounds: Examination of the right-upper field reveals wheezing. Examination of the left-upper field reveals wheezing. Examination of the right-middle field reveals wheezing. Examination of the left-middle field reveals wheezing. Examination of the right-lower field reveals wheezing. Examination of the left-lower field reveals wheezing. Wheezing present.     Comments: Increased effort of breathing.  Inspiratory and expiratory wheezing noted in all lung fields Abdominal:     General: Abdomen is flat. There is no distension. There are no signs of injury.     Palpations: Abdomen is soft. There is no mass or pulsatile mass.     Tenderness: There is no abdominal tenderness. There is no right CVA tenderness, left CVA tenderness, guarding or rebound.  Musculoskeletal:     Right lower leg: Normal.     Left lower leg: Normal.     Comments: Tenderness to left thoracic back on palpation.  No midline tenderness for me to cervical, thoracic, or lumbar spine.  Skin:    General: Skin is warm and dry.  Neurological:     General: No focal deficit present.     Mental Status: He is alert.  Psychiatric:        Behavior: Behavior is cooperative.    ED Results / Procedures / Treatments   Labs (all labs ordered are listed, but only abnormal results are displayed) Labs Reviewed  COMPREHENSIVE METABOLIC PANEL - Abnormal; Notable for the following components:      Result Value   Sodium 134 (*)    Glucose, Bld 119 (*)    Calcium 8.3 (*)    Total Protein 6.1 (*)    Albumin 3.2 (*)    Total Bilirubin 2.0 (*)    All  other components within normal limits  CBC WITH DIFFERENTIAL/PLATELET - Abnormal; Notable for the following components:   WBC 28.2 (*)    RBC 4.20 (*)    All other components within normal limits  URINALYSIS, ROUTINE W REFLEX MICROSCOPIC - Abnormal; Notable for the following components:   APPearance HAZY (*)    Leukocytes,Ua TRACE (*)    Bacteria, UA RARE (*)    All other components within normal limits  RESP PANEL BY RT-PCR (FLU A&B, COVID) ARPGX2  URINE CULTURE  CULTURE, BLOOD (ROUTINE X 2)  CULTURE, BLOOD (ROUTINE X 2)  LACTIC ACID, PLASMA  BLOOD GAS, VENOUS    EKG None  Radiology DG Chest Port 1 View  Result Date: 02/08/2021 CLINICAL  DATA:  Cough and shortness of breath. EXAM: PORTABLE CHEST 1 VIEW COMPARISON:  01/21/2021, chest CT 12/06/2020 FINDINGS: The cardiomediastinal contours are normal. Chronic bronchial thickening, unchanged. Pulmonary vasculature is normal. No consolidation, pleural effusion, or pneumothorax. No acute osseous abnormalities are seen. IMPRESSION: Chronic bronchial thickening. No acute abnormality. Electronically Signed   By: Keith Rake M.D.   On: 02/08/2021 02:02    Procedures .Critical Care Performed by: Loni Beckwith, PA-C Authorized by: Loni Beckwith, PA-C   Critical care provider statement:    Critical care time (minutes):  60   Critical care was necessary to treat or prevent imminent or life-threatening deterioration of the following conditions:  Respiratory failure and sepsis   Critical care was time spent personally by me on the following activities:  Development of treatment plan with patient or surrogate, evaluation of patient's response to treatment, examination of patient, ordering and review of laboratory studies, ordering and review of radiographic studies, ordering and performing treatments and interventions, pulse oximetry, re-evaluation of patient's condition and review of old charts   Care discussed with: admitting  provider      Medications Ordered in ED Medications  0.9 %  sodium chloride infusion ( Intravenous New Bag/Given 02/08/21 0438)  albuterol (VENTOLIN HFA) 108 (90 Base) MCG/ACT inhaler 4 puff (4 puffs Inhalation Given 02/07/21 2351)  acetaminophen (TYLENOL) tablet 1,000 mg (1,000 mg Oral Given 02/07/21 2358)  albuterol (PROVENTIL) (2.5 MG/3ML) 0.083% nebulizer solution 2.5 mg (2.5 mg Nebulization Given 02/08/21 0122)  methylPREDNISolone sodium succinate (SOLU-MEDROL) 125 mg/2 mL injection 125 mg (125 mg Intravenous Given 02/08/21 0208)  magnesium sulfate IVPB 2 g 50 mL (0 g Intravenous Stopped 02/08/21 0311)  ondansetron (ZOFRAN) injection 4 mg (4 mg Intravenous Given 02/08/21 0208)  albuterol (PROVENTIL) (2.5 MG/3ML) 0.083% nebulizer solution (10 mg/hr Nebulization Given 02/08/21 0214)  lactated ringers bolus 1,000 mL (0 mLs Intravenous Stopped 02/08/21 0410)  albuterol (PROVENTIL) (2.5 MG/3ML) 0.083% nebulizer solution (10 mg/hr Nebulization Given 02/08/21 0409)  ketorolac (TORADOL) 15 MG/ML injection 15 mg (15 mg Intravenous Given 02/08/21 0409)  cefTRIAXone (ROCEPHIN) 2 g in sodium chloride 0.9 % 100 mL IVPB (0 g Intravenous Stopped 02/08/21 0513)    ED Course/ Medical Decision Making/ A&P Clinical Course as of 02/08/21 0544  Fri Feb 08, 2021  0508 Spoke to Dr. Hal Hope who will see the patient for admission [PB]    Clinical Course User Index [PB] Loni Beckwith, PA-C                           Medical Decision Making  This patient presents to the ED for concern of shortness of breath and wheezing, this involves an extensive number of treatment options, and is a complaint that carries with it a high risk of complications and morbidity.  The differential diagnosis includes pneumonia, asthma exacerbation, viral upper respiratory infection, COVID-19, influenza, sepsis.   Co morbidities that complicate the patient evaluation  Asthma   Additional history obtained:  Additional history obtained  from patient and patient's family have her at bedside External records from outside source obtained and reviewed including notes from previous providers, CT imaging, lab work   Lab Tests:  I Ordered, and personally interpreted labs.  The pertinent results include:  Leukocytosis at 28.2 Urinalysis shows bacteria rare, WBC 11-20, except trace Lactic acid within normal limits Blood cultures pending, BG pending   Imaging Studies ordered:  I ordered imaging studies including chest x-ray  I independently visualized and interpreted imaging which showed no consolidation, pleural effusion, or pneumothorax, chronic bronchial thickening I agree with the radiologist interpretation   Cardiac Monitoring:  The patient was maintained on a cardiac monitor.  I personally viewed and interpreted the cardiac monitored which showed an underlying rhythm of: Sinus tachycardia   Medicines ordered and prescription drug management:  I ordered medication including albuterol, continuous nebulizer, magnesium sulfate, Solu-Medrol for wheezing and shortness of breath Additionally will order Tylenol for fever Reevaluation of the patient after these medicines showed that the patient improved I have reviewed the patients home medicines and have made adjustments as needed   Test Considered:  CT renal study   Critical Interventions:  Multiple rounds of albuterol for asthma exacerbation    Problem List / ED Course:  Asthma exacerbation Patient has history of asthma, per chart review patient seen multiple times for asthma exacerbation.  Upon arrival had increased effort of breathing and significant expiratory expiratory wheezing. Patient had no improvement in symptoms symptoms after albuterol neb x1 and continuous nebulizer, Solu-Medrol and magnesium however wheezing is still concerning.   Sepsis Febrile, tachycardic, and leukocytosis.  Patient appears to have possible urinary source.  We will start him on  ceftriaxone at this time.   Reevaluation:  After the interventions noted above, I reevaluated the patient and found that they have :improved   Social Determinants of Health:  Tobacco use   Dispostion:  After consideration of the diagnostic results and the patients response to treatment, I feel that the patent would benefit from admission at this time.  Spoke to hospitalist Dr. Hal Hope who will admit the patient.   Patient was discussed with and evaluated by Dr. Tinnie Gens.         Final Clinical Impression(s) / ED Diagnoses Final diagnoses:  Sepsis without acute organ dysfunction, due to unspecified organism ([place])  Exacerbation of asthma, unspecified asthma severity, unspecified whether persistent    Rx / DC Orders ED Discharge Orders     None         Loni Beckwith, PA-C 02/08/21 0545    Teressa Lower, MD 02/08/21 [phone]

## 2021-02-09 LAB — URINE CULTURE
Culture: <10000 — AB
Culture: NO GROWTH

## 2021-02-09 LAB — CBC WITH DIFFERENTIAL/PLATELET
Abs Immature Granulocytes: 0 K/uL (ref 0.00–0.07)
Basophils Absolute: 0 K/uL (ref 0.0–0.1)
Basophils Relative: 0 %
Eosinophils Absolute: 0 K/uL (ref 0.0–0.5)
Eosinophils Relative: 0 %
HCT: 36.4 % — ABNORMAL LOW (ref 39.0–52.0)
Hemoglobin: 12.4 g/dL — ABNORMAL LOW (ref 13.0–17.0)
Lymphocytes Relative: 4 %
Lymphs Abs: 1.6 K/uL (ref 0.7–4.0)
MCH: 33.2 pg (ref 26.0–34.0)
MCHC: 34.1 g/dL (ref 30.0–36.0)
MCV: 97.6 fL (ref 80.0–100.0)
Monocytes Absolute: 3.9 K/uL — ABNORMAL HIGH (ref 0.1–1.0)
Monocytes Relative: 10 %
Neutro Abs: 33.5 K/uL — ABNORMAL HIGH (ref 1.7–7.7)
Neutrophils Relative %: 86 %
Platelets: 295 K/uL (ref 150–400)
RBC: 3.73 MIL/uL — ABNORMAL LOW (ref 4.22–5.81)
RDW: 12.5 % (ref 11.5–15.5)
WBC: 38.9 K/uL — ABNORMAL HIGH (ref 4.0–10.5)
nRBC: 0 % (ref 0.0–0.2)
nRBC: 0 /100{WBCs}

## 2021-02-09 LAB — BASIC METABOLIC PANEL WITH GFR
Anion gap: 6 (ref 5–15)
BUN: 15 mg/dL (ref 6–20)
CO2: 25 mmol/L (ref 22–32)
Calcium: 8.7 mg/dL — ABNORMAL LOW (ref 8.9–10.3)
Chloride: 104 mmol/L (ref 98–111)
Creatinine, Ser: 0.97 mg/dL (ref 0.61–1.24)
GFR, Estimated: >60 mL/min
Glucose, Bld: 111 mg/dL — ABNORMAL HIGH (ref 70–99)
Potassium: 4.9 mmol/L (ref 3.5–5.1)
Sodium: 135 mmol/L (ref 135–145)

## 2021-02-09 MED ORDER — METHYLPREDNISOLONE SODIUM SUCC 40 MG IJ SOLR
40.0000 mg | INTRAMUSCULAR | Status: DC
  Administered 2021-02-09 – 2021-02-12 (×3): 40 mg via INTRAVENOUS
  Filled 2021-02-09 (×3): qty 1

## 2021-02-09 MED ORDER — IPRATROPIUM-ALBUTEROL 0.5-2.5 (3) MG/3ML IN SOLN
3.0000 mL | Freq: Four times a day (QID) | RESPIRATORY_TRACT | Status: DC
  Administered 2021-02-09 – 2021-02-11 (×8): 3 mL via RESPIRATORY_TRACT
  Filled 2021-02-09 (×7): qty 3

## 2021-02-09 MED ORDER — AZITHROMYCIN 500 MG PO TABS
500.0000 mg | ORAL_TABLET | Freq: Every day | ORAL | Status: AC
  Administered 2021-02-09 – 2021-02-12 (×4): 500 mg via ORAL
  Filled 2021-02-09 (×4): qty 1

## 2021-02-09 NOTE — Progress Notes (Signed)
PHARMACIST - PHYSICIAN COMMUNICATION DR:   Vann CONCERNING: Antibiotic IV to Oral Route Change Policy  RECOMMENDATION: This patient is receiving azithromycin by the intravenous route.  Based on criteria approved by the Pharmacy and Therapeutics Committee, the antibiotic(s) is/are being converted to the equivalent oral dose form(s).   DESCRIPTION: These criteria include:  Patient being treated for a respiratory tract infection, urinary tract infection, cellulitis or clostridium difficile associated diarrhea if on metronidazole  The patient is not neutropenic and does not exhibit a GI malabsorption state  The patient is eating (either orally or via tube) and/or has been taking other orally administered medications for a least 24 hours  The patient is improving clinically and has a Tmax < 100.5  If you have questions about this conversion, please contact the Pharmacy Department  []  ( [phone] )  [facility] [place] []  ( [phone]  [facility] [x]  ( [phone]  [facility] []  ( [phone]  [facility] []  ( [phone] )  [facility]   

## 2021-02-09 NOTE — Progress Notes (Signed)
PROGRESS NOTE  Keith Henry  DOB: 1975/08/22  PCP: Kathyrn Lass TX:[id number]  DOA: 02/07/2021  LOS: 0 days  Hospital Day: 3  Chief Complaint  Patient presents with   Shortness of Breath   Cough   Brief narrative: Keith Henry is a 46 y.o. male with PMH significant for asthma and tobacco abuse. Patient presented to the ED with complaint of grossly worsening shortness of breath for 3 weeks despite using inhalers.   In the ED, patient had a fever up to 102.8, heart rate elevated to 131, breathing on room air, he was found to be diffusely wheezing.   Subjective: Still wheezing, denies SI  Assessment/Plan: Sepsis -Presented with fever, tachycardia, tachypnea, diffuse wheezing, leukocytosis. -Atypical pneumonia versus bronchitis. -Blood cultures sent, broad-spectrum IV antibiotics started. -Also started on IV steroids and bronchodilators.  -wean steroids as able  Leukocytosis -? Steroids -on abx -smear pending  Bilateral shoulder pain/rib pain -At the time of my evaluation, patient is having significant bilateral shoulder pain, 'pain in the ribs'.  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 -He was given Toradol in the ED.  I will start him on Tylenol and tramadol.  Abnormal urinalysis -Hazy yellow urine with trace leukocytes and rare bacteria.  -Denies any urinary symptoms.  Abnormal depression/suicide screening -denies current issues -says he has thought about in the past but nothing currently -outpatient follow up  Chronic smoking -Counseled to quit.   Mobility: Encourage ambulation Living condition: Lives at home Goals of care:   Code Status: Full Code  Nutritional status: Body mass index is 28.98 kg/m.      Diet:  Diet Order             Diet regular Room service appropriate? Yes; Fluid consistency: Thin  Diet effective now                  DVT prophylaxis:  enoxaparin (LOVENOX) injection 40 mg Start: 02/08/21  1800    Status is: Observation  Continue in-hospital care because: Continues to have pain, wheezing, on IV steroids Level of care: Telemetry Medical   Dispo: The patient is from: Home              Anticipated d/c is to: Home in AM?              Patient currently is not medically stable to d/c.   Difficult to place patient No     Infusions:   cefTRIAXone (ROCEPHIN)  IV 2 g (02/09/21 0344)    Scheduled Meds:  azithromycin  500 mg Oral Daily   budesonide (PULMICORT) nebulizer solution  0.25 mg Nebulization BID   enoxaparin (LOVENOX) injection  40 mg Subcutaneous Q24H   ipratropium-albuterol  3 mL Nebulization Q4H   [START ON 02/10/2021] methylPREDNISolone (SOLU-MEDROL) injection  40 mg Intravenous Q24H    PRN meds: acetaminophen **OR** acetaminophen, albuterol, traMADol   Antimicrobials: Anti-infectives (From admission, onward)    Start     Dose/Rate Route Frequency Ordered Stop   02/09/21 1000  azithromycin (ZITHROMAX) tablet 500 mg        500 mg Oral Daily 02/09/21 0850 02/13/21 0959   02/08/21 0800  azithromycin (ZITHROMAX) 500 mg in sodium chloride 0.9 % 250 mL IVPB  Status:  Discontinued        500 mg 250 mL/hr over 60 Minutes Intravenous Every 24 hours 02/08/21 0558 02/09/21 0850   02/08/21 0600  cefTRIAXone (ROCEPHIN) 2 g  in sodium chloride 0.9 % 100 mL IVPB        2 g 200 mL/hr over 30 Minutes Intravenous Every 24 hours 02/08/21 0558 02/13/21 0359   02/08/21 0400  cefTRIAXone (ROCEPHIN) 2 g in sodium chloride 0.9 % 100 mL IVPB        2 g 200 mL/hr over 30 Minutes Intravenous  Once 02/08/21 0347 02/08/21 0513       Objective: Vitals:   02/09/21 0756 02/09/21 0800  BP:  120/81  Pulse: 95 99  Resp: 18 (!) 24  Temp:    SpO2: 99% 98%    Intake/Output Summary (Last 24 hours) at 02/09/2021 1035 Last data filed at 02/09/2021 0852 Gross per 24 hour  Intake --  Output 550 ml  Net -550 ml   Filed Weights   02/08/21 2100  Weight: 91.6 kg   Weight change:   Body mass index is 28.98 kg/m.   Physical Exam:  General: Appearance:     Overweight male who appears uncomfortable     Lungs:      respirations unlabored, occ wheeze  Heart:    Normal heart rate.   MS:   All extremities are intact.    Neurologic:   Awake, alert, oriented x 3     Data Review: I have personally reviewed the laboratory data and studies available.  Signed, Geradine Girt, DO [facility] 02/09/2021

## 2021-02-10 LAB — CBC WITH DIFFERENTIAL/PLATELET
Abs Immature Granulocytes: 0 10*3/uL (ref 0.00–0.07)
Basophils Absolute: 0 10*3/uL (ref 0.0–0.1)
Basophils Relative: 0 %
Eosinophils Absolute: 0.3 10*3/uL (ref 0.0–0.5)
Eosinophils Relative: 1 %
HCT: 34.9 % — ABNORMAL LOW (ref 39.0–52.0)
Hemoglobin: 11.9 g/dL — ABNORMAL LOW (ref 13.0–17.0)
Lymphocytes Relative: 9 %
Lymphs Abs: 2.5 10*3/uL (ref 0.7–4.0)
MCH: 33.5 pg (ref 26.0–34.0)
MCHC: 34.1 g/dL (ref 30.0–36.0)
MCV: 98.3 fL (ref 80.0–100.0)
Monocytes Absolute: 0.8 10*3/uL (ref 0.1–1.0)
Monocytes Relative: 3 %
Neutro Abs: 24.5 10*3/uL — ABNORMAL HIGH (ref 1.7–7.7)
Neutrophils Relative %: 87 %
Platelets: 304 10*3/uL (ref 150–400)
RBC: 3.55 MIL/uL — ABNORMAL LOW (ref 4.22–5.81)
RDW: 12.3 % (ref 11.5–15.5)
WBC: 28.2 10*3/uL — ABNORMAL HIGH (ref 4.0–10.5)
nRBC: 0 % (ref 0.0–0.2)
nRBC: 0 /100 WBC

## 2021-02-10 LAB — BASIC METABOLIC PANEL
Anion gap: 11 (ref 5–15)
BUN: 19 mg/dL (ref 6–20)
CO2: 25 mmol/L (ref 22–32)
Calcium: 8.3 mg/dL — ABNORMAL LOW (ref 8.9–10.3)
Chloride: 101 mmol/L (ref 98–111)
Creatinine, Ser: 1.13 mg/dL (ref 0.61–1.24)
GFR, Estimated: >60 mL/min (ref >60)
Glucose, Bld: 149 mg/dL — ABNORMAL HIGH (ref 70–99)
Potassium: 3.9 mmol/L (ref 3.5–5.1)
Sodium: 137 mmol/L (ref 135–145)

## 2021-02-10 MED ORDER — POLYETHYLENE GLYCOL 3350 17 G PO PACK
17.0000 g | PACK | Freq: Every day | ORAL | Status: DC
  Administered 2021-02-10 – 2021-02-11 (×2): 17 g via ORAL
  Filled 2021-02-10 (×4): qty 1

## 2021-02-10 MED ORDER — NICOTINE 21 MG/24HR TD PT24
21.0000 mg | MEDICATED_PATCH | Freq: Every day | TRANSDERMAL | Status: DC
  Administered 2021-02-10 – 2021-02-13 (×4): 21 mg via TRANSDERMAL
  Filled 2021-02-10 (×4): qty 1

## 2021-02-10 NOTE — TOC Initial Note (Signed)
Transition of Care [facility]) - Initial/Assessment Note    Patient Details  Name: Keith Henry MRN: [id number] Date of Birth: 1975/11/27  Transition of Care [facility]) CM/SW Contact:    Lawerance Sabal, RN Phone Number: 02/10/2021, 3:25 PM  Clinical Narrative:        Patient admitted, with 12 ED visits in the past 6 months. Utilizes ED in lieu of primary care. Patient is uninsured. Medical workup ongoing. Needs PCP referral, counseling for ED utilization to minimize overuse and avoid long wait times for care. TOC following for MATCH/ medication needs, homeless resources.     Barriers to Discharge: Continued Medical Work up, Homeless with medical needs, Inadequate or no insurance   Patient Goals and CMS Choice        Expected Discharge Plan and Services   In-house Referral: Clinical Social Work Discharge Planning Services: CM Consult                                          Prior Living Arrangements/Services                       Activities of Daily Living Home Assistive Devices/Equipment: None ADL Screening (condition at time of admission) Patient's cognitive ability adequate to safely complete daily activities?: Yes Is the patient deaf or have difficulty hearing?: No Does the patient have difficulty seeing, even when wearing glasses/contacts?: No Does the patient have difficulty concentrating, remembering, or making decisions?: No Patient able to express need for assistance with ADLs?: No Does the patient have difficulty dressing or bathing?: No Independently performs ADLs?: Yes (appropriate for developmental age) Does the patient have difficulty walking or climbing stairs?: No Weakness of Legs: None Weakness of Arms/Hands: None  Permission Sought/Granted                  Emotional Assessment              Admission diagnosis:  SIRS (systemic inflammatory response syndrome) (HCC) [R65.10] Exacerbation of asthma, unspecified asthma severity,  unspecified whether persistent [J45.901] Sepsis without acute organ dysfunction, due to unspecified organism [facility]) [A41.9] Asthma exacerbation [J45.901] Patient Active Problem List   Diagnosis Date Noted   SIRS (systemic inflammatory response syndrome) (HCC) 02/08/2021   Asthma exacerbation 02/08/2021   PCP:  Pcp, No Pharmacy:   [id number] [address] [address] - [address] AT [facility] [address] [facility] [address] [place] [address] Phone: [phone] Fax: [phone]  [facility] [phone] - [place], [place] - [address] AT [facility] OF [address] & [address] [place] [address] Phone: [phone] Fax: [phone]  [facility] [address] [place] [address] Phone: [phone] Fax: [phone]     Social Determinants of Health (SDOH) Interventions    Readmission Risk Interventions No flowsheet data found.

## 2021-02-10 NOTE — Progress Notes (Signed)
PROGRESS NOTE  Keith Henry  DOB: May 07, 1975  PCP: Aviva Kluver ZOX:[id number]  DOA: 02/07/2021  LOS: 1 day  Hospital Day: 4  Chief Complaint  Patient presents with   Shortness of Breath   Cough   Brief narrative: Keith Henry is a 46 y.o. male with PMH significant for asthma and tobacco abuse. Patient presented to the ED with complaint of grossly worsening shortness of breath for 3 weeks despite using inhalers.   In the ED, patient had a fever up to 102.8, heart rate elevated to 131, breathing on room air, he was found to be diffusely wheezing.  Still smoking, Slow to improve.     Subjective: C/o pain in his head that is says he is not able to describe  Assessment/Plan: Sepsis/asthma exacerbation -Presented with fever, tachycardia, tachypnea, diffuse wheezing, leukocytosis. -Atypical pneumonia versus bronchitis. -Blood cultures sent- NGTD -IV Abx -Also started on IV steroids and bronchodilators.  -wean steroids to PO for outpatient taper  Leukocytosis -? Steroids -on abx -smear pending -recheck in AM  Bilateral shoulder pain/rib pain -At the time of my evaluation, patient is having significant bilateral shoulder pain, 'pain in the ribs'.  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 -He was given Toradol in the ED.  I will start him on Tylenol and tramadol.  Abnormal depression/suicide screening -denies current issues -says he has thought about in the past but nothing currently -outpatient follow up  Chronic smoking -Counseled to quit.  -nicotine patch  Mobility: Encourage ambulation Living condition: Lives at home Goals of care:   Code Status: Full Code  Nutritional status: Body mass index is 28.98 kg/m.      Diet:  Diet Order             Diet regular Room service appropriate? Yes; Fluid consistency: Thin  Diet effective now                  DVT prophylaxis:  enoxaparin (LOVENOX) injection 40 mg Start: 02/08/21  1800    Status WJ:[id number]  Continue in-hospital care because: wean IV steroids to PO Level of care: Med-Surg   Dispo: The patient is from: Home              Anticipated d/c is to: Home in AM?              Patient currently is not medically stable to d/c.   Difficult to place patient No     Infusions:   cefTRIAXone (ROCEPHIN)  IV Stopped (02/10/21 0416)    Scheduled Meds:  azithromycin  500 mg Oral Daily   budesonide (PULMICORT) nebulizer solution  0.25 mg Nebulization BID   enoxaparin (LOVENOX) injection  40 mg Subcutaneous Q24H   ipratropium-albuterol  3 mL Nebulization Q6H   methylPREDNISolone (SOLU-MEDROL) injection  40 mg Intravenous Q24H   nicotine  21 mg Transdermal Daily   polyethylene glycol  17 g Oral Daily    PRN meds: acetaminophen **OR** acetaminophen, albuterol, traMADol   Antimicrobials: Anti-infectives (From admission, onward)    Start     Dose/Rate Route Frequency Ordered Stop   02/09/21 1000  azithromycin (ZITHROMAX) tablet 500 mg        500 mg Oral Daily 02/09/21 0850 02/13/21 0959   02/08/21 0800  azithromycin (ZITHROMAX) 500 mg in sodium chloride 0.9 % 250 mL IVPB  Status:  Discontinued        500 mg 250 mL/hr over 60 Minutes  Intravenous Every 24 hours 02/08/21 0558 02/09/21 0850   02/08/21 0600  cefTRIAXone (ROCEPHIN) 2 g in sodium chloride 0.9 % 100 mL IVPB        2 g 200 mL/hr over 30 Minutes Intravenous Every 24 hours 02/08/21 0558 02/13/21 0359   02/08/21 0400  cefTRIAXone (ROCEPHIN) 2 g in sodium chloride 0.9 % 100 mL IVPB        2 g 200 mL/hr over 30 Minutes Intravenous  Once 02/08/21 0347 02/08/21 0513       Objective: Vitals:   02/10/21 0744 02/10/21 1156  BP:  123/78  Pulse: 81 87  Resp: 20 19  Temp:  97.7 F (36.5 C)  SpO2: 98% 96%    Intake/Output Summary (Last 24 hours) at 02/10/2021 1239 Last data filed at 02/10/2021 1238 Gross per 24 hour  Intake 197.34 ml  Output 800 ml  Net -602.66 ml   Filed Weights   02/08/21 2100   Weight: 91.6 kg   Weight change:  Body mass index is 28.98 kg/m.   Physical Exam:   General: Appearance:     Overweight male in no acute distress     Lungs:     Forced expiratory wheeze, respirations unlabored  Heart:    Normal heart rate.    MS:   All extremities are intact.    Neurologic:   Awake, alert, oriented x 3. No apparent focal neurological           defect.        Data Review: I have personally reviewed the laboratory data and studies available.  Signed, Joseph Art, DO [facility] 02/10/2021

## 2021-02-11 LAB — CBC
HCT: 36.7 % — ABNORMAL LOW (ref 39.0–52.0)
Hemoglobin: 12.3 g/dL — ABNORMAL LOW (ref 13.0–17.0)
MCH: 33 pg (ref 26.0–34.0)
MCHC: 33.5 g/dL (ref 30.0–36.0)
MCV: 98.4 fL (ref 80.0–100.0)
Platelets: 379 10*3/uL (ref 150–400)
RBC: 3.73 MIL/uL — ABNORMAL LOW (ref 4.22–5.81)
RDW: 12.3 % (ref 11.5–15.5)
WBC: 15.9 10*3/uL — ABNORMAL HIGH (ref 4.0–10.5)
nRBC: 0 % (ref 0.0–0.2)

## 2021-02-11 MED ORDER — IPRATROPIUM-ALBUTEROL 0.5-2.5 (3) MG/3ML IN SOLN
3.0000 mL | Freq: Three times a day (TID) | RESPIRATORY_TRACT | Status: DC
  Administered 2021-02-11 – 2021-02-13 (×5): 3 mL via RESPIRATORY_TRACT
  Filled 2021-02-11 (×6): qty 3

## 2021-02-11 NOTE — Progress Notes (Signed)
PROGRESS NOTE  Keith SCHERTZER  DOB: [birth year]  PCP: Aviva Kluver QVZ:[id number]  DOA: 02/07/2021  LOS: 2 days  Hospital Day: 5  Chief Complaint  Patient presents with   Shortness of Breath   Cough   Brief narrative: Keith Henry is a 46 y.o. male with PMH significant for asthma and tobacco abuse. Patient presented to the ED with complaint of grossly worsening shortness of breath for 3 weeks despite using inhalers.   In the ED, patient had a fever up to 102.8, heart rate elevated to 131, breathing on room air, he was found to be diffusely wheezing.  Still smoking, Slow to improve.     02/11/2021: Patient was seen and examined at his bedside.  Reports persistent discomfort in his chest worse with taking a deep breath.  Assessment/Plan: Sepsis/asthma exacerbation likely secondary to community-acquired pneumonia and ongoing tobacco use. -Presented with fever, tachycardia, tachypnea, diffuse wheezing, leukocytosis. -Atypical pneumonia versus bronchitis. -Blood cultures sent- NGTD -IV Abx, continue Rocephin and oral azithromycin. Continue IV steroids, Solu-Medrol 40 mg daily and bronchodilators. Wean off IV steroids as tolerated. Obtain procalcitonin level in the morning.  Ongoing tobacco use Tobacco cessation counseled at bedside Continue nicotine patch  Bilateral shoulder pain/rib pain -At the time of my evaluation, patient is having significant bilateral shoulder pain, 'pain in the ribs'.  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 -He was given Toradol in the ED.  I will start him on Tylenol and tramadol. Continue analgesics  Chronic depression/suicide screening -says he has thought about in the past but nothing currently -outpatient follow up with psychiatry.    Mobility: Encourage ambulation Living condition: Lives at home Goals of care:   Code Status: Full Code  Nutritional status: Body mass index is 28.98 kg/m.      Diet:  Diet  Order             Diet regular Room service appropriate? Yes; Fluid consistency: Thin  Diet effective now                  DVT prophylaxis:  enoxaparin (LOVENOX) injection 40 mg Start: 02/08/21 1800    Status PI:RJJO  Continue in-hospital care because: wean IV steroids to PO Level of care: Med-Surg   Dispo: The patient is from: Home              Anticipated d/c is to: Home possibly on 02/12/2021.              Patient currently is not medically stable to d/c.   Difficult to place patient No     Infusions:   cefTRIAXone (ROCEPHIN)  IV 2 g (02/11/21 0351)    Scheduled Meds:  azithromycin  500 mg Oral Daily   budesonide (PULMICORT) nebulizer solution  0.25 mg Nebulization BID   enoxaparin (LOVENOX) injection  40 mg Subcutaneous Q24H   ipratropium-albuterol  3 mL Nebulization TID   methylPREDNISolone (SOLU-MEDROL) injection  40 mg Intravenous Q24H   nicotine  21 mg Transdermal Daily   polyethylene glycol  17 g Oral Daily    PRN meds: acetaminophen **OR** acetaminophen, albuterol, traMADol   Antimicrobials: Anti-infectives (From admission, onward)    Start     Dose/Rate Route Frequency Ordered Stop   02/09/21 1000  azithromycin (ZITHROMAX) tablet 500 mg        500 mg Oral Daily 02/09/21 0850 02/13/21 0959   02/08/21 0800  azithromycin (ZITHROMAX) 500 mg in sodium  chloride 0.9 % 250 mL IVPB  Status:  Discontinued        500 mg 250 mL/hr over 60 Minutes Intravenous Every 24 hours 02/08/21 0558 02/09/21 0850   02/08/21 0600  cefTRIAXone (ROCEPHIN) 2 g in sodium chloride 0.9 % 100 mL IVPB        2 g 200 mL/hr over 30 Minutes Intravenous Every 24 hours 02/08/21 0558 02/13/21 0359   02/08/21 0400  cefTRIAXone (ROCEPHIN) 2 g in sodium chloride 0.9 % 100 mL IVPB        2 g 200 mL/hr over 30 Minutes Intravenous  Once 02/08/21 0347 02/08/21 0513       Objective: Vitals:   02/11/21 0828 02/11/21 1220  BP:  132/82  Pulse:  74  Resp:  17  Temp:  97.6 F (36.4 C)   SpO2: 93% 93%   No intake or output data in the 24 hours ending 02/11/21 1405  Filed Weights   02/08/21 2100  Weight: 91.6 kg   Weight change:  Body mass index is 28.98 kg/m.   Physical Exam:   General: Appearance:  Well-developed well-nourished in no acute stress.  He is alert and oriented x3. Overweight male in no acute distress     Lungs:   Mild rales at bases.  No wheezing noted.  Poor inspiratory effort.  Heart:  Regular rate and rhythm no rubs or gallops.  No JVD or thyromegaly.   MS: No lower extremity edema bilaterally.   Neurologic: Awake and alert.  Moves all 4 extremities.  Nonfocal exam. Psych: Mood is anxious.       Data Review: I have personally reviewed the laboratory data and studies available.  Signed, Darlin Drop, DO [facility] 02/11/2021

## 2021-02-11 NOTE — TOC Progression Note (Addendum)
Transition of Care [facility]) - Progression Note    Patient Details  Name: Keith Henry MRN: [id number] Date of Birth: 09/05/1975  Transition of Care [facility]) CM/SW Contact  Mearl Latin, LCSW Phone Number: 02/11/2021, 3:17 PM  Clinical Narrative:    CSW spoke with patient regarding homeless consult. Patient reported that he either stays with friends or in a hotel if he is able to work. He reported has inquired about the pop up houses but was told they were full. He is also familiar with the Interactive resource center. He reported that he applied for disabilities in the 90s but was denied. He then spent time in prison and has been in and out of jobs since then but due to his health has been unable to work steadily. He reported that he and his daughter both have Bipolar diagnoses as well. Financial counseling is following patient's case for eligibility. CSW provided homeless resources and crisis resources (including [facility]) to patient; he has a cell phone. PCP follow up appt to be made by TOC.    Expected Discharge Plan: Homeless Shelter Barriers to Discharge: Continued Medical Work up, Homeless with medical needs, Inadequate or no insurance  Expected Discharge Plan and Services Expected Discharge Plan: Homeless Shelter In-house Referral: Clinical Social Work Discharge Planning Services: CM Consult                                           Social Determinants of Health (SDOH) Interventions    Readmission Risk Interventions No flowsheet data found.

## 2021-02-11 NOTE — TOC Progression Note (Signed)
Transition of Care [facility]) - Progression Note    Patient Details  Name: Keith Henry MRN: [id number] Date of Birth: 1975/12/11  Transition [facility] Care [facility]) CM/SW Contact  Harriet Masson, RN Phone Number: 02/11/2021, 1:47 PM  Clt. Patient also agreeable for me to sinical Narrative:    Spoke to patient regarding PCP appointment. Patient is agreeable to go to a PCP at [facility]. Requested TOC assistant to make PCP appointment. I  told him about [facility] and instructions on AVS. Request for Financial assistance to start application for medicaid. Sent email to Christia Reading to start medicaid process.  TOC will continue to follow patient for [facility]    Expected Discharge Plan: Homeless Shelter Barriers to Discharge: Continued Medical Work up, Homeless with medical needs, Inadequate or no insurance  Expected Discharge Plan and Services Expected Discharge Plan: Homeless Shelter In-house Referral: Clinical Social Work Discharge Planning Services: CM Consult                                           Social Determinants of Health (SDOH) Interventions    Readmission Risk Interventions No flowsheet data found.

## 2021-02-11 NOTE — Discharge Instructions (Signed)
[facility]. You can call [facility] to take you to your appointments as long as it is a [facility] facility. Call [phone] to schedule a FREE ride

## 2021-02-12 LAB — PATHOLOGIST SMEAR REVIEW

## 2021-02-12 NOTE — Progress Notes (Signed)
PROGRESS NOTE  Keith Henry  DOB: [birth year]  PCP: Kathyrn Lass TX:[id number]  DOA: 02/07/2021  LOS: 3 days  Hospital Day: 6  Chief Complaint  Patient presents with   Shortness of Breath   Cough   Brief narrative: Keith Henry is a 46 y.o. male with PMH significant for asthma, untreated bipolar disorder, and tobacco abuse who presented to [facility] ED with complaint of grossly worsening shortness of breath for 3 weeks despite using his home inhaler.  A presentation, febrile with T-max 102.8, tachycardic 131 with audible wheezing on exam.    He was admitted for asthma exacerbation and community-acquired pneumonia and treated with IV steroids, IV antibiotics empirically for community-acquired pneumonia, and bronchodilators.    Hospital course complicated by depressed mood.  Psychiatry consulted to assist with the management of his untreated bipolar disorder.   02/12/2021: Seen at his bedside.  Depressed mood.  Not on any antidepressants or medications for bipolar disorder.  Assessment/Plan: Sepsis/asthma exacerbation likely secondary to community-acquired pneumonia and ongoing tobacco use. -Presented with fever with T-max 102.8, tachycardia, tachypnea, diffuse wheezing, leukocytosis. -Blood cultures sent- NGTD, urine culture negative. Completed course of IV antibiotics 5 days of Rocephin and azithromycin. Completed 5 days of IV Solu-Medrol.  Continue bronchodilators. Continue to maintain O2 saturation greater than 92%.  Untreated bipolar disorder, self-reported Depressed mood Not on antidepressant or medication for bipolar disorder prior to admission Psychiatry consulted to assist with the management of his bipolar disorder  Ongoing tobacco use Tobacco cessation counseled at bedside Continue nicotine patch  Bilateral shoulder pain/rib pain -No reported pain at the time of the visit -As needed analgesics  Chronic depression/suicide screening -says he has thought about in the past  but nothing currently Not on antidepressant prior to admission Management per psychiatry.    Mobility: Encourage ambulation  Goals of care:   Code Status: Full Code  Nutritional status: Body mass index is 28.98 kg/m.   Consultant: Psychiatry via epic on 02/12/2021.     Diet:  Diet Order             Diet regular Room service appropriate? Yes; Fluid consistency: Thin  Diet effective now                  DVT prophylaxis:  enoxaparin (LOVENOX) injection 40 mg Start: 02/08/21 1800    Status [id number]  Continue in-hospital care because: wean IV steroids to PO Level of care: Med-Surg   Dispo: The patient is from: Home              Anticipated d/c is to: Home possibly on 02/13/2021 01 psychiatry signs off..              Patient currently is not medically stable to d/c.   Difficult to place patient No     Infusions:   cefTRIAXone (ROCEPHIN)  IV 2 g (02/12/21 0519)    Scheduled Meds:  budesonide (PULMICORT) nebulizer solution  0.25 mg Nebulization BID   enoxaparin (LOVENOX) injection  40 mg Subcutaneous Q24H   ipratropium-albuterol  3 mL Nebulization TID   methylPREDNISolone (SOLU-MEDROL) injection  40 mg Intravenous Q24H   nicotine  21 mg Transdermal Daily   polyethylene glycol  17 g Oral Daily    PRN meds: acetaminophen **OR** acetaminophen, albuterol, traMADol   Antimicrobials: Anti-infectives (From admission, onward)    Start     Dose/Rate Route Frequency Ordered Stop   02/09/21 1000  azithromycin (ZITHROMAX) tablet 500 mg  500 mg Oral Daily 02/09/21 0850 02/12/21 0949   02/08/21 0800  azithromycin (ZITHROMAX) 500 mg in sodium chloride 0.9 % 250 mL IVPB  Status:  Discontinued        500 mg 250 mL/hr over 60 Minutes Intravenous Every 24 hours 02/08/21 0558 02/09/21 0850   02/08/21 0600  cefTRIAXone (ROCEPHIN) 2 g in sodium chloride 0.9 % 100 mL IVPB        2 g 200 mL/hr over 30 Minutes Intravenous Every 24 hours 02/08/21 0558 02/13/21 0359    02/08/21 0400  cefTRIAXone (ROCEPHIN) 2 g in sodium chloride 0.9 % 100 mL IVPB        2 g 200 mL/hr over 30 Minutes Intravenous  Once 02/08/21 0347 02/08/21 0513       Objective: Vitals:   02/12/21 0759 02/12/21 0800  BP: 124/73   Pulse: 98   Resp: 18   Temp: 97.6 F (36.4 C)   SpO2: 94% 94%    Intake/Output Summary (Last 24 hours) at 02/12/2021 1039 Last data filed at 02/12/2021 0020 Gross per 24 hour  Intake 360 ml  Output --  Net 360 ml    Filed Weights   02/08/21 2100  Weight: 91.6 kg   Weight change:  Body mass index is 28.98 kg/m.   Physical Exam:   General: Appearance:  Well-developed well-nourished in no acute distress.  He is alert and oriented x3. Overweight male in no acute distress     Lungs:   Clear presentation no wheezes or rales.  Good inspiratory effort.  Heart:  Regular rate and rhythm no rubs or gallops.  No JVD or thyromegaly noted.  MS: No lower extremity edema bilaterally.   Neurologic: Nonfocal exam.  Alert and awake. Psych: Depressed mood.       Data Review: I have personally reviewed the laboratory data and studies available.  Signed, Kayleen Memos, DO [facility] 02/12/2021

## 2021-02-12 NOTE — Consult Note (Signed)
[facility]   Service Date: February 12, 2021 LOS:  LOS: 3 days    Assessment  Keith Henry is a 46 y.o. male admitted medically for 02/07/2021  9:23 PM for pneumonia. He carries the psychiatric diagnoses of bipolar disorder and has a past medical history of  asthma.Psychiatry was consulted for untreated bipolar disorder by Raphael Gibneyarol Hall.    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 Underlying diagnosis likely bipolar II (with substance use) vs schizoaffective disorder; in any case will start risperidone.  He has additionally had poor responses to SSRIs in the past which trigger these episodes. Previously did well on risperidone while in prison per pt; will start this at this time. He has no current SI, HI, AH/VH and is future oriented thoughout interview and contracts for safety no need for inpt psych hospital stay or sitter at this time; will re-evaluate x1 during hospital stay and likely sign off at that point.   Diagnoses:  Active Hospital problems: Principal Problem:   SIRS (systemic inflammatory response syndrome) (HCC) Active Problems:   Asthma exacerbation    Problems edited/added by me: No problems updated.  Plan  ## Safety and Observation Level:  - Based on my clinical evaluation, I estimate the patient to be at low risk of self harm in the current setting - At this time, we recommend a routine level of observation. This decision is based on my review of the chart including patient's history and current presentation, interview of the patient, mental status examination, and consideration of suicide risk including evaluating suicidal ideation, plan, intent, suicidal or self-harm  behaviors, risk factors, and protective factors. This judgment is based on our ability to directly address suicide risk, implement suicide prevention strategies and develop a safety plan while the patient is in the clinical setting. Please contact our team if there is a concern that risk level has changed.   ## Medications:  -- s risperidone 1 mg QHS  ## Medical Decision Making Capacity:  Not formally assessed  ## Further Work-up:  -- lipid profile/A1c, TSH, B12    -- most recent EKG on 1/6 had QtC of 453  ## Disposition:  -- per primary -- please ask SW to place information about [facility] to his chart  Thank you for this consult request. Recommendations have been communicated to the primary team.  We will continue to follow at this time.   Suhaib Guzzo A Eoin Willden   NEW history  Relevant Aspects of Hospital Course:  Admitted on 02/07/2021 for grossly worsening shortness of breath for 3 weeks despite using his home inhaler.  A presentation, febrile with T-max 102.8, tachycardic 131 with audible wheezing on exam. He was admitted for asthma exacerbation and community-acquired pneumonia and treated with IV steroids, IV antibiotics empirically for community-acquired pneumonia, and bronchodilators.      Patient Report:  Patient seen in afternon. He reports significant depressive sx including low moood, worthlessness "right now I'm not working", "I lost my car", anhedonia, poor concentration/mind wandering, and poor appetite - currently sleeping too much. Also endorses significant hx of manic sx primarily dysphoric mania (no hx grandiosity) - is up for 4-5 nights without sleep when sober and up to 2 weeks when using substances; during these periods generally endorses  distractibility, irritability, flight of ideas, increased goal directed activity, etc. He says that his depression is usually at its worst after he crashes from one of these periods. He has had these periods set off by SSRIs.   He  denies any current suicidal thoughts; last had passive suicidal ideation ~2 weeks ago. No access to guns - states if he had a gun he "would not be in" this situation but clarifies that he would have used the gun to make money and not on himself. He states he is to "proud" to attempt suicide by any direct means; has 2 prior lifetime attempts via indirect means (in late November of 2021 via picking a fight and remotely via police).   Aside from depression/mania screening, endorses sporadic AH/VH most recently a couple of weeks ago (demons); these tell him to do things which he mostly ignores. Has some paranoia (feels people are talking about him when they are not). Feels the TV sometimes sends him "subjective" (subliminal) messages. Unclear how this relates temporally to periods of dysphoric mania as above.   Trauma hx of 8 years in prison, alludes to significant other traumatic events but does not feel comfortable discussing.   Did not answer anxiety questions (significant coughing, deferred as will not change mgmt)  Deferred full r/b/se discussion as pt became fatigued and kept coughing; no reported s/e to risperidone in past.   ROS:  Cough, chest pain (made worse w/ coughing)  Collateral information:  None  Psychiatric History:  Information collected from pt, no info in chart  Psych dx/sx: bipolar disorder Has not seen a psychiatrist or therapist since he got out of jail 8 years ago In prison on risperidone, depakote, oxcarb and did fairly well - also on quetiapine and olanzapine which were overly sedating. Hx psychiatric hospitalizations including to [facility] (prev state facility) but none for last 10 years    Social History: Tobacco use: yes, daily, has patch Alcohol use: 1/2 gallon of whiskey a couple of times a week, no more than 3-4 d/week, no w/d sx in years (drank daily in late teens/early 20s with hx tremors) Drug use: periodic marijuana and cocaine, 1-2d/week  Currently couch  surfing Unemployed. Mostly works in Art therapist. Raised muslim, currently believes in "right and wrong" Has GED, multiple technical certifications.   Family History:  1 28 y/o daughter with bipolar disorder, persistent suicidality, and at least 3-4 hospitalizations and attempts.  Brother with bipolar disorder + schizophrenia  The patient's family history is not on file.  Medical History: Past Medical History:  Diagnosis Date   Asthma     Surgical History: Past Surgical History:  Procedure Laterality Date   ANKLE SURGERY     SKIN GRAFT     SKULL FRACTURE ELEVATION     pt unsure of specifics    Medications:   Current Facility-Administered Medications:    acetaminophen (TYLENOL) tablet 650 mg, 650 mg, Oral, Q6H PRN, 650 mg at 02/08/21 0924 **OR** acetaminophen (TYLENOL) suppository 650 mg, 650 mg, Rectal, Q6H PRN, Eduard Clos, MD   albuterol (PROVENTIL) (2.5 MG/3ML) 0.083% nebulizer solution 2.5 mg, 2.5 mg, Nebulization, Q2H PRN, Eduard Clos, MD   budesonide (PULMICORT) nebulizer solution 0.25 mg, 0.25 mg, Nebulization, BID, Toniann Fail, Arshad N, MD, 0.25 mg at 02/12/21 0800   enoxaparin (LOVENOX) injection 40 mg, 40 mg, Subcutaneous, Q24H, Eduard Clos, MD, 40 mg at 02/11/21 1755   ipratropium-albuterol (DUONEB) 0.5-2.5 (3) MG/3ML nebulizer solution 3 mL, 3 mL, Nebulization, TID, Margo Aye,  Carole N, DO, 3 mL at 02/12/21 0800   nicotine (NICODERM CQ - dosed in mg/24 hours) patch 21 mg, 21 mg, Transdermal, Daily, Vann, Jessica U, DO, 21 mg at 02/12/21 0949   polyethylene glycol (MIRALAX / GLYCOLAX) packet 17 g, 17 g, Oral, Daily, Vann, Jessica U, DO, 17 g at 02/11/21 1027   traMADol (ULTRAM) tablet 50 mg, 50 mg, Oral, Q6H PRN, Dahal, Binaya, MD, 50 mg at 02/08/21 2132  Allergies: Allergies  Allergen Reactions   Other Anaphylaxis    Pork   Methylprednisolone Sodium Succ Nausea And Vomiting       Objective  Vital signs:  Temp:  [97.6 F  (36.4 C)-98.5 F (36.9 C)] 97.6 F (36.4 C) (01/10 1223) Pulse Rate:  [68-98] 79 (01/10 1223) Resp:  [18-20] 20 (01/10 1223) BP: (124-139)/(73-102) 128/89 (01/10 1223) SpO2:  [93 %-100 %] 93 % (01/10 1223)   Psychiatric Specialty Exam:  Presentation  General Appearance: Appropriate for Environment; Casual Eye Contact:Good Speech:Clear and Coherent (frequent coughing) Speech Volume:Normal Handedness:No data recorded  Mood and Affect  Mood:Depressed Affect:Congruent  Thought Process  Thought Processes:Coherent; Goal Directed; Linear Descriptions of Associations:Intact  Orientation:Full (Time, Place and Person)  Thought Content:-- (devoid of SI, HI, current delusions)  History of Schizophrenia/Schizoaffective disorder:No data recorded Duration of Psychotic Symptoms:No data recorded Hallucinations:Hallucinations: None  Ideas of Reference:None  Suicidal Thoughts:Suicidal Thoughts: No (most recent around new year)  Homicidal Thoughts:Homicidal Thoughts: No   Sensorium  Memory:Immediate Good; Recent Good; Remote Good Judgment:Good Insight:Good  Executive Functions  Concentration:Good Attention Span:Good Recall:Good Fund of Knowledge:Good Language:Good  Psychomotor Activity  Psychomotor Activity:Psychomotor Activity: Normal  Assets  Assets:Communication Skills; Desire for Improvement; Vocational/Educational  Sleep  Sleep:Sleep: -- (too much lately)   Physical Exam: Physical Exam Review of Systems  Respiratory:  Positive for cough.   Cardiovascular:  Positive for chest pain.       CP worse with coughing   Blood pressure 128/89, pulse 79, temperature 97.6 F (36.4 C), temperature source Oral, resp. rate 20, height 5\' 10"  (1.778 m), weight 91.6 kg, SpO2 93 %. Body mass index is 28.98 kg/m.

## 2021-02-12 NOTE — Progress Notes (Addendum)
°   02/12/21 1530  Clinical Encounter Type  Visited With Patient  Visit Type Initial;Spiritual support  Referral From Nurse  Consult/Referral To Chaplain   Chaplain visited with patient based on [name]. Patient was open to discuss his concerns and shared information about his ability to cope with his illness. Patient indicated a desire to get better desires a job which will allow him to secure stable housing. Patient stressed belief in a higher power which allows him to have hope for the future. Chaplain asked about family support and patient has four children. The mother lives in [place]; however, his younger daughter been supportive.  Chaplain encouraged patient do stay focused and to remain positive. This note was provided by Johnna Acosta. Call [phone] if there are any questions.

## 2021-02-12 NOTE — Progress Notes (Signed)
I came to room for [facility] treatment, per pt he just used his home inhaler.  I educated pt on importance that while in the hospital Respiratory Therapy should provide his HHN tx so that we can better monitor his breathing.  Inhaler was placed in pt personal bag, and pt was instructed to have RN call the RT if he needs a breathing tx.  Pt was very receptive and appreciated the education.  Currently [facility] clear diminished and pt states his breathing is "fine" currently, HHN tx held at this time d/t recent MDI use.

## 2021-02-13 ENCOUNTER — Other Ambulatory Visit (HOSPITAL_COMMUNITY): Payer: Self-pay

## 2021-02-13 LAB — CULTURE, BLOOD (ROUTINE X 2)
Culture: NO GROWTH
Culture: NO GROWTH
Special Requests: ADEQUATE
Special Requests: ADEQUATE

## 2021-02-13 MED ORDER — ALBUTEROL SULFATE (2.5 MG/3ML) 0.083% IN NEBU
2.5000 mg | INHALATION_SOLUTION | RESPIRATORY_TRACT | 0 refills | Status: DC | PRN
  Filled 2021-02-13: qty 90, 6d supply, fill #0

## 2021-02-13 MED ORDER — RISPERIDONE 1 MG PO TABS
1.0000 mg | ORAL_TABLET | Freq: Every day | ORAL | 0 refills | Status: DC
  Filled 2021-02-13: qty 14, 14d supply, fill #0

## 2021-02-13 MED ORDER — ALBUTEROL SULFATE HFA 108 (90 BASE) MCG/ACT IN AERS
1.0000 | INHALATION_SPRAY | Freq: Four times a day (QID) | RESPIRATORY_TRACT | 0 refills | Status: DC | PRN
  Filled 2021-02-13: qty 18, 24d supply, fill #0

## 2021-02-13 NOTE — Discharge Summary (Signed)
Physician Discharge Summary  ERHARDT DADA YQM:[id number] DOB: 1975-06-04 DOA: 02/07/2021  PCP: Oneita Hurt, No  Admit date: 02/07/2021 Discharge date: 02/13/2021  Admitted From: Home Disposition: Home  Recommendations for Outpatient Follow-up:  Follow up with PCP  Please obtain BMP/CBC in one week  Home Health:NO  Discharge Condition:Stable CODE STATUS:FULL Diet recommendation: Regular  Brief/Interim Summary:  NAPHTALI ZYWICKI is a 46 y.o. male with PMH significant for asthma, untreated bipolar disorder, and tobacco abuse who presented to [facility] ED with complaint of grossly worsening shortness of breath for 3 weeks despite using his home inhaler.  A presentation, febrile with T-max 102.8, tachycardic 131 with audible wheezing on exam.     He was admitted for asthma exacerbation and community-acquired pneumonia and treated with IV steroids, IV antibiotics empirically for community-acquired pneumonia, and bronchodilators.     Hospital course complicated by depressed mood.  Psychiatry consulted to assist with the management of his untreated bipolar disorder.   Sepsis/asthma exacerbation likely secondary to  acute bronch and ongoing tobacco use. -Sepsis present on admission, presented with fever with T-max 102.8, tachycardia, tachypnea, diffuse wheezing, leukocytosis. -Blood cultures sent- NGTD, urine culture negative. Completed course of IV antibiotics 5 days of Rocephin and azithromycin. Completed 5 days of IV Solu-Medrol.  Continue bronchodilators. Continue to maintain O2 saturation greater than 92%. -No indication for further antibiotics or steroids on discharge, commendation for as needed albuterol and tobacco cessation.   Untreated bipolar disorder, self-reported Depressed mood Not on antidepressant or medication for bipolar disorder prior to admission Psychiatry consulted to assist with the management of his bipolar disorder, he is started on low-dose risperidone 1 mg at bedtime, to get  supply 14 days on discharge as discussed with psychiatry.   Ongoing tobacco use counseled   Bilateral shoulder pain/rib pain -No reported pain at the time of the visit -As needed analgesics   Chronic depression/suicide screening -No active suicidal thoughts or ideations.     Discharge Diagnoses:  Principal Problem:   SIRS (systemic inflammatory response syndrome) (HCC) Active Problems:   Asthma exacerbation    Discharge Instructions  Discharge Instructions     Discharge instructions   Complete by: As directed    Follow with Primary MD/[facility].  Get CBC, CMP, checked  by Primary MD next visit.    Activity: As tolerated with Full fall precautions use walker/cane & assistance as needed   Disposition Home    Diet: Heart Healthy .  On your next visit with your primary care physician please Get Medicines reviewed and adjusted.   Please request your Prim.MD to go over all Hospital Tests and Procedure/Radiological results at the follow up, please get all Hospital records sent to your Prim MD by signing hospital release before you go home.   If you experience worsening of your admission symptoms, develop shortness of breath, life threatening emergency, suicidal or homicidal thoughts you must seek medical attention immediately by calling 911 or calling your MD immediately  if symptoms less severe.  You Must read complete instructions/literature along with all the possible adverse reactions/side effects for all the Medicines you take and that have been prescribed to you. Take any new Medicines after you have completely understood and accpet all the possible adverse reactions/side effects.   Do not drive, operating heavy machinery, perform activities at heights, swimming or participation in water activities or provide baby sitting services if your were admitted for syncope or siezures until you have seen by Primary MD or a Neurologist  and advised to do so  again.  Do not drive when taking Pain medications.    Do not take more than prescribed Pain, Sleep and Anxiety Medications  Special Instructions: If you have smoked or chewed Tobacco  in the last 2 yrs please stop smoking, stop any regular Alcohol  and or any Recreational drug use.  Wear Seat belts while driving.   Please note  You were cared for by a hospitalist during your hospital stay. If you have any questions about your discharge medications or the care you received while you were in the hospital after you are discharged, you can call the unit and asked to speak with the hospitalist on call if the hospitalist that took care of you is not available. Once you are discharged, your primary care physician will handle any further medical issues. Please note that NO REFILLS for any discharge medications will be authorized once you are discharged, as it is imperative that you return to your primary care physician (or establish a relationship with a primary care physician if you do not have one) for your aftercare needs so that they can reassess your need for medications and monitor your lab values.   Increase activity slowly   Complete by: As directed       Allergies as of 02/13/2021       Reactions   Other Anaphylaxis   Pork   Methylprednisolone Sodium Succ Nausea And Vomiting        Medication List     STOP taking these medications    doxycycline 100 MG capsule Commonly known as: VIBRAMYCIN   ibuprofen 600 MG tablet Commonly known as: ADVIL       TAKE these medications    albuterol 108 (90 Base) MCG/ACT inhaler Commonly known as: VENTOLIN HFA Inhale 1-2 puffs into the lungs every 6 (six) hours as needed for wheezing or shortness of breath. What changed: Another medication with the same name was removed. Continue taking this medication, and follow the directions you see here.   methocarbamol 500 MG tablet Commonly known as: ROBAXIN Take 1 tablet (500 mg total) by  mouth 2 (two) times daily.   risperiDONE 1 MG tablet Commonly known as: RisperDAL Take 1 tablet (1 mg total) by mouth at bedtime for 14 days.        Follow-up Information     Hoy Register, MD Follow up.   Specialty: Family Medicine Why: TIME: 9:30 AM DATE : FEBRUARY 15 ,2023 DOCTOR Jerold Coombe Contact information: [address] [place] [address] [phone]         [facility]. Go to.   Specialty: Urgent Care Why: Walk in for any mental health crisis. Contact information: [address] [place] [address] [phone]               Allergies  Allergen Reactions   Other Anaphylaxis    Pork   Methylprednisolone Sodium Succ Nausea And Vomiting    Consultations: Psychiatry   Procedures/Studies: DG Chest 2 View  Result Date: 01/21/2021 CLINICAL DATA:  Cough and shortness of breath for 14 days EXAM: CHEST - 2 VIEW COMPARISON:  12/29/2020 FINDINGS: Normal heart size and mediastinal contours. Chronic mild airway thickening. No acute infiltrate or edema. No effusion or pneumothorax. No acute osseous findings. Healed right mid clavicle fracture. IMPRESSION: Chronic airway thickening.  No acute finding. Electronically Signed   By: Tiburcio Pea M.D.   On: 01/21/2021 07:28  DG Chest Port 1 View  Result Date: 02/08/2021 CLINICAL DATA:  Cough and shortness of breath. EXAM: PORTABLE CHEST 1 VIEW COMPARISON:  01/21/2021, chest CT 12/06/2020 FINDINGS: The cardiomediastinal contours are normal. Chronic bronchial thickening, unchanged. Pulmonary vasculature is normal. No consolidation, pleural effusion, or pneumothorax. No acute osseous abnormalities are seen. IMPRESSION: Chronic bronchial thickening. No acute abnormality. Electronically Signed   By: Narda Rutherford M.D.   On: 02/08/2021 02:02   CT Renal Stone Study  Result Date: 01/21/2021 CLINICAL DATA:  Flank pain EXAM: CT ABDOMEN AND PELVIS WITHOUT CONTRAST  TECHNIQUE: Multidetector CT imaging of the abdomen and pelvis was performed following the standard protocol without IV contrast. COMPARISON:  CT abdomen and pelvis 05/28/2019 FINDINGS: Lower chest: No acute abnormality. Hepatobiliary: Liver is enlarged measuring 19.8 cm in length. 3.7 cm hypodense mass in the posterior aspect of the right hepatic lobe which was previously described as a hemangioma. Gallbladder appears normal. No biliary ductal dilatation. Pancreas: Unremarkable. No pancreatic ductal dilatation or surrounding inflammatory changes. Spleen: Normal in size without focal abnormality. Adrenals/Urinary Tract: Adrenal glands are unremarkable. Kidneys are normal, without renal calculi, focal lesion, or hydronephrosis. Bladder is unremarkable. Stomach/Bowel: Stomach is within normal limits. Appendix appears normal. No evidence of bowel wall thickening, distention, or inflammatory changes. Vascular/Lymphatic: No significant vascular findings are present. No enlarged abdominal or pelvic lymph nodes. Reproductive: Prostate is unremarkable. Other: Small umbilical hernia containing fat. No ascites identified. Musculoskeletal: No suspicious bony lesions. IMPRESSION: 1. No nephrolithiasis or obstructive uropathy. 2. Hepatomegaly. 3.7 cm mass in the posterior right hepatic lobe which was previously described as a hemangioma. Electronically Signed   By: Jannifer Hick M.D.   On: 01/21/2021 12:59      Subjective:  Complains of chronic shoulder pain, no suicidal thoughts or ideations.  Discharge Exam: Vitals:   02/13/21 0634 02/13/21 0757  BP:  (!) 111/54  Pulse:  91  Resp:  18  Temp:  98.7 F (37.1 C)  SpO2: 97% 96%   Vitals:   02/12/21 2021 02/13/21 0500 02/13/21 0634 02/13/21 0757  BP: 125/73 126/83  (!) 111/54  Pulse: 95 74  91  Resp: 17 18  18   Temp: 97.7 F (36.5 C) 97.8 F (36.6 C)  98.7 F (37.1 C)  TempSrc: Oral Oral  Oral  SpO2: 93% (!) 89% 97% 96%  Weight:      Height:         General: Pt is alert, awake, not in acute distress Cardiovascular: RRR, S1/S2 +, no rubs, no gallops Respiratory: CTA bilaterally, no wheezing, no rhonchi Abdominal: Soft, NT, ND, bowel sounds + Extremities: no edema, no cyanosis    The results of significant diagnostics from this hospitalization (including imaging, microbiology, ancillary and laboratory) are listed below for reference.     Microbiology: Recent Results (from the past 240 hour(s))  Resp Panel by RT-PCR (Flu A&B, Covid) Nasopharyngeal Swab     Status: None   Collection Time: 02/07/21 12:12 AM   Specimen: Nasopharyngeal Swab; Nasopharyngeal(NP) swabs in vial transport medium  Result Value Ref Range Status   SARS Coronavirus 2 by RT PCR NEGATIVE NEGATIVE Final    Comment: (NOTE) SARS-CoV-2 target nucleic acids are NOT DETECTED.  The SARS-CoV-2 RNA is generally detectable in upper respiratory specimens during the acute phase of infection. The lowest concentration of SARS-CoV-2 viral copies this assay can detect is 138 copies/mL. A negative result does not preclude SARS-Cov-2 infection and should not be used as the sole basis for  treatment or other patient management decisions. A negative result may occur with  improper specimen collection/handling, submission of specimen other than nasopharyngeal swab, presence of viral mutation(s) within the areas targeted by this assay, and inadequate number of viral copies(<138 copies/mL). A negative result must be combined with clinical observations, patient history, and epidemiological information. The expected result is Negative.  Fact Sheet for Patients:  BloggerCourse.comhttps://www.fda.gov/media/152166/download  Fact Sheet for Healthcare Providers:  SeriousBroker.ithttps://www.fda.gov/media/152162/download  This test is no t yet approved or cleared by the [place] FDA and  has been authorized for detection and/or diagnosis of SARS-CoV-2 by FDA under an Emergency Use Authorization (EUA). This  EUA will remain  in effect (meaning this test can be used) for the duration of the COVID-19 declaration under Section 564(b)(1) of the Act, 21 U.S.C.section 360bbb-3(b)(1), unless the authorization is terminated  or revoked sooner.       Influenza A by PCR NEGATIVE NEGATIVE Final   Influenza B by PCR NEGATIVE NEGATIVE Final    Comment: (NOTE) The Xpert Xpress SARS-CoV-2/FLU/RSV plus assay is intended as an aid in the diagnosis of influenza from Nasopharyngeal swab specimens and should not be used as a sole basis for treatment. Nasal washings and aspirates are unacceptable for Xpert Xpress SARS-CoV-2/FLU/RSV testing.  Fact Sheet for Patients: BloggerCourse.comhttps://www.fda.gov/media/152166/download  Fact Sheet for Healthcare Providers: SeriousBroker.ithttps://www.fda.gov/media/152162/download  This test is not yet approved or cleared by the [place] FDA and has been authorized for detection and/or diagnosis of SARS-CoV-2 by FDA under an Emergency Use Authorization (EUA). This EUA will remain in effect (meaning this test can be used) for the duration of the COVID-19 declaration under Section 564(b)(1) of the Act, 21 U.S.C. section 360bbb-3(b)(1), unless the authorization is terminated or revoked.  Performed at [facility], [address] [place], [place] [address]   Urine Culture     Status: Abnormal   Collection Time: 02/08/21  2:48 AM   Specimen: Urine, Clean Catch  Result Value Ref Range Status   Specimen Description URINE, CLEAN CATCH  Final   Special Requests NONE  Final   Culture (A)  Final    <10,000 COLONIES/mL INSIGNIFICANT GROWTH Performed at [facility], [address] [place], [place] [address]    Report Status 02/09/2021 FINAL  Final  Blood culture (routine x 2)     Status: None (Preliminary result)   Collection Time: 02/08/21  4:15 AM   Specimen: BLOOD  Result Value Ref Range Status   Specimen Description BLOOD RIGHT ANTECUBITAL  Final   Special Requests   Final     BOTTLES DRAWN AEROBIC AND ANAEROBIC Blood Culture adequate volume   Culture   Final    NO GROWTH 4 DAYS Performed at [facility], [address] [place], [place] [address]    Report Status PENDING  Incomplete  Blood culture (routine x 2)     Status: None (Preliminary result)   Collection Time: 02/08/21  4:35 AM   Specimen: BLOOD RIGHT HAND  Result Value Ref Range Status   Specimen Description BLOOD RIGHT HAND  Final   Special Requests AEROBIC BOTTLE ONLY Blood Culture adequate volume  Final   Culture   Final    NO GROWTH 4 DAYS Performed at [facility], [address] [place], [place] [address]    Report Status PENDING  Incomplete  Urine Culture     Status: None   Collection Time: 02/08/21 11:18 AM   Specimen: Urine, Clean Catch  Result Value Ref Range Status   Specimen Description URINE, CLEAN CATCH  Final   Special Requests NONE  Final   Culture   Final    NO GROWTH Performed at [facility], [address] [place], [place] [address]    Report Status 02/09/2021 FINAL  Final     Labs: BNP (last 3 results) No results for input(s): BNP in the last 8760 hours. Basic Metabolic Panel: Recent Labs  Lab 02/08/21 0300 02/08/21 0437 02/08/21 0802 02/09/21 0044 02/10/21 0101  NA 134* 135  --  135 137  K 3.8 3.8  --  4.9 3.9  CL 100  --   --  104 101  CO2 24  --   --  25 25  GLUCOSE 119*  --   --  111* 149*  BUN 11  --   --  15 19  CREATININE 1.18  --  1.42* 0.97 1.13  CALCIUM 8.3*  --   --  8.7* 8.3*   Liver Function Tests: Recent Labs  Lab 02/08/21 0300  AST 17  ALT 13  ALKPHOS 89  BILITOT 2.0*  PROT 6.1*  ALBUMIN 3.2*   No results for input(s): LIPASE, AMYLASE in the last 168 hours. No results for input(s): AMMONIA in the last 168 hours. CBC: Recent Labs  Lab 02/08/21 0300 02/08/21 0437 02/08/21 0802 02/09/21 0044 02/10/21 0101 02/11/21 0124  WBC 28.2*  --  27.0* 38.9* 28.2* 15.9*  NEUTROABS 25.9*  --   --  33.5* 24.5*  --    HGB 13.6 13.3 13.0 12.4* 11.9* 12.3*  HCT 41.4 39.0 39.6 36.4* 34.9* 36.7*  MCV 98.6  --  99.0 97.6 98.3 98.4  PLT 296  --  289 295 304 379   Cardiac Enzymes: No results for input(s): CKTOTAL, CKMB, CKMBINDEX, TROPONINI in the last 168 hours. BNP: Invalid input(s): POCBNP CBG: No results for input(s): GLUCAP in the last 168 hours. D-Dimer No results for input(s): DDIMER in the last 72 hours. Hgb A1c No results for input(s): HGBA1C in the last 72 hours. Lipid Profile No results for input(s): CHOL, HDL, LDLCALC, TRIG, CHOLHDL, LDLDIRECT in the last 72 hours. Thyroid function studies No results for input(s): TSH, T4TOTAL, T3FREE, THYROIDAB in the last 72 hours.  Invalid input(s): FREET3 Anemia work up No results for input(s): VITAMINB12, FOLATE, FERRITIN, TIBC, IRON, RETICCTPCT in the last 72 hours. Urinalysis    Component Value Date/Time   COLORURINE YELLOW 02/08/2021 0248   APPEARANCEUR HAZY (A) 02/08/2021 0248   LABSPEC 1.024 02/08/2021 0248   PHURINE 5.0 02/08/2021 0248   GLUCOSEU NEGATIVE 02/08/2021 0248   HGBUR NEGATIVE 02/08/2021 0248   BILIRUBINUR NEGATIVE 02/08/2021 0248   KETONESUR NEGATIVE 02/08/2021 0248   PROTEINUR NEGATIVE 02/08/2021 0248   NITRITE NEGATIVE 02/08/2021 0248   LEUKOCYTESUR TRACE (A) 02/08/2021 0248   Sepsis Labs Invalid input(s): PROCALCITONIN,  WBC,  LACTICIDVEN Microbiology Recent Results (from the past 240 hour(s))  Resp Panel by RT-PCR (Flu A&B, Covid) Nasopharyngeal Swab     Status: None   Collection Time: 02/07/21 12:12 AM   Specimen: Nasopharyngeal Swab; Nasopharyngeal(NP) swabs in vial transport medium  Result Value Ref Range Status   SARS Coronavirus 2 by RT PCR NEGATIVE NEGATIVE Final    Comment: (NOTE) SARS-CoV-2 target nucleic acids are NOT DETECTED.  The SARS-CoV-2 RNA is generally detectable in upper respiratory specimens during the acute phase of infection. The lowest concentration of SARS-CoV-2 viral copies this assay can  detect is 138  copies/mL. A negative result does not preclude SARS-Cov-2 infection and should not be used as the sole basis for treatment or other patient management decisions. A negative result may occur with  improper specimen collection/handling, submission of specimen other than nasopharyngeal swab, presence of viral mutation(s) within the areas targeted by this assay, and inadequate number of viral copies(<138 copies/mL). A negative result must be combined with clinical observations, patient history, and epidemiological information. The expected result is Negative.  Fact Sheet for Patients:  BloggerCourse.com  Fact Sheet for Healthcare Providers:  SeriousBroker.it  This test is no t yet approved or cleared by the [place] FDA and  has been authorized for detection and/or diagnosis of SARS-CoV-2 by FDA under an Emergency Use Authorization (EUA). This EUA will remain  in effect (meaning this test can be used) for the duration of the COVID-19 declaration under Section 564(b)(1) of the Act, 21 U.S.C.section 360bbb-3(b)(1), unless the authorization is terminated  or revoked sooner.       Influenza A by PCR NEGATIVE NEGATIVE Final   Influenza B by PCR NEGATIVE NEGATIVE Final    Comment: (NOTE) The Xpert Xpress SARS-CoV-2/FLU/RSV plus assay is intended as an aid in the diagnosis of influenza from Nasopharyngeal swab specimens and should not be used as a sole basis for treatment. Nasal washings and aspirates are unacceptable for Xpert Xpress SARS-CoV-2/FLU/RSV testing.  Fact Sheet for Patients: BloggerCourse.com  Fact Sheet for Healthcare Providers: SeriousBroker.it  This test is not yet approved or cleared by the [place] FDA and has been authorized for detection and/or diagnosis of SARS-CoV-2 by FDA under an Emergency Use Authorization (EUA). This EUA will remain in  effect (meaning this test can be used) for the duration of the COVID-19 declaration under Section 564(b)(1) of the Act, 21 U.S.C. section 360bbb-3(b)(1), unless the authorization is terminated or revoked.  Performed at [facility], [address], [place], [place] [address]   Urine Culture     Status: Abnormal   Collection Time: 02/08/21  2:48 AM   Specimen: Urine, Clean Catch  Result Value Ref Range Status   Specimen Description URINE, CLEAN CATCH  Final   Special Requests NONE  Final   Culture (A)  Final    <10,000 COLONIES/mL INSIGNIFICANT GROWTH Performed at [facility], [address], [place], [place] [address]    Report Status 02/09/2021 FINAL  Final  Blood culture (routine x 2)     Status: None (Preliminary result)   Collection Time: 02/08/21  4:15 AM   Specimen: BLOOD  Result Value Ref Range Status   Specimen Description BLOOD RIGHT ANTECUBITAL  Final   Special Requests   Final    BOTTLES DRAWN AEROBIC AND ANAEROBIC Blood Culture adequate volume   Culture   Final    NO GROWTH 4 DAYS Performed at [facility], [address] [place], [place] [address]    Report Status PENDING  Incomplete  Blood culture (routine x 2)     Status: None (Preliminary result)   Collection Time: 02/08/21  4:35 AM   Specimen: BLOOD RIGHT HAND  Result Value Ref Range Status   Specimen Description BLOOD RIGHT HAND  Final   Special Requests AEROBIC BOTTLE ONLY Blood Culture adequate volume  Final   Culture   Final    NO GROWTH 4 DAYS Performed at [facility], [address] [place], [place] [address]    Report Status PENDING  Incomplete  Urine Culture  Status: None   Collection Time: 02/08/21 11:18 AM   Specimen: Urine, Clean Catch  Result Value Ref Range Status   Specimen Description URINE, CLEAN CATCH  Final   Special Requests NONE  Final   Culture   Final    NO GROWTH Performed at [facility], [address], [place], [place] [address]     Report Status 02/09/2021 FINAL  Final     Time coordinating discharge: Over 30 minutes  SIGNED:   Huey Bienenstock, MD  [facility] 02/13/2021, 11:03 AM Pager   If 7PM-7AM, please contact night-coverage [email or web address] Password [email or web address]

## 2021-02-13 NOTE — TOC Transition Note (Signed)
Transition of Care [facility]) - CM/SW Discharge Note   Patient Details  Name: Keith Henry MRN: [id number] Date of Birth: 06-28-1975  [facility] CM/SW Contact:  Harriet Masson, RN Phone Number: 02/13/2021, 12:02 PM   Clinical Narrative:    Patient stable for discharge. TOC delivered medications to his room.  Apt made for [facility] and patient aware of the importance of going to this appointment.  Patient states he has transportation home.  Final next level of care: Home/Self Care Barriers to Discharge: Barriers Resolved   Patient Goals and CMS Choice    Get better    Discharge Placement                       Discharge Plan and Services In-house Referral: Clinical Social Work Discharge Planning Services: CM Consult                                 Social Determinants of Health (SDOH) Interventions     Readmission Risk Interventions No flowsheet data found.

## 2021-02-13 NOTE — Progress Notes (Signed)
Keith Henry to be D/C'd Home per MD order.  Discussed with the patient and all questions fully answered.  VSS, Skin clean, dry and intact without evidence of skin break down, no evidence of skin tears noted. IV catheter discontinued intact. Site without signs and symptoms of complications. Dressing and pressure applied.  An After Visit Summary was printed and given to the patient. Patient received prescription.  D/c education completed with patient/family including follow up instructions, medication list, d/c activities limitations if indicated, with other d/c instructions as indicated by MD - patient able to verbalize understanding, all questions fully answered.   Patient instructed to return to ED, call 911, or call MD for any changes in condition.   Patient escorted via [place], and D/C home via private auto.  [place] 02/13/2021 2:52 PM

## 2021-02-13 NOTE — Consult Note (Signed)
Keith Henry   Service Date: February 13, 2021 LOS:  LOS: 4 days    Assessment  Keith Henry is a 46 y.o. male admitted medically for 02/07/2021  9:23 PM for pneumonia. He carries the psychiatric diagnoses of bipolar disorder and has a past medical history of  asthma.Psychiatry was consulted for untreated bipolar disorder by Keith Henry.    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 Underlying diagnosis likely bipolar II (with substance use) vs schizoaffective disorder; in any case will start risperidone.  He has additionally had poor responses to SSRIs in the past which trigger these episodes. Previously did well on risperidone while in prison per pt; will start this at this time. He has no current SI, HI, AH/VH and is future oriented thoughout interview and contracts for safety no need for inpt psych hospital stay or sitter at this time; will re-evaluate x1 during hospital stay and likely sign off at that point.  1/11: Pt to be discharged with outpt resources. Please dc with 2 weeks of risperidone as he secures outpt f/u.    Diagnoses:  Active Hospital problems: Principal Problem:   SIRS (systemic inflammatory response syndrome) (HCC) Active Problems:   Asthma exacerbation    Problems edited/added by me: No problems updated.  Plan  ## Safety and Observation Level:  - Based on my clinical Henry, I estimate the patient to be at low risk of self harm in the current setting - At this time, we recommend a routine level of observation. This decision is based on my review of the chart including patient's history and current presentation, interview of the patient, mental status examination, and  consideration of suicide risk including evaluating suicidal ideation, plan, intent, suicidal or self-harm behaviors, risk factors, and protective factors. This judgment is based on our ability to directly address suicide risk, implement suicide prevention strategies and develop a safety plan while the patient is in the clinical setting. Please contact our team if there is a concern that risk level has changed.   ## Medications:  -- s risperidone 1 mg QHS (unfortunately this was not started before dc)   ## Medical Decision Making Capacity:  Not formally assessed  ## Further Work-up:  -- lipid profile/A1c, TSH, B12    -- most recent EKG on 1/6 had QtC of 453  ## Disposition:  -- per primary -- please ask SW to place information about [facility] to his chart  Thank you for this consult request. Recommendations have been communicated to the primary team.  We will continue to follow at this time.   Keith Henry   NEW history  Relevant Aspects of Hospital Course:  Admitted on 02/07/2021 for grossly worsening shortness of breath for 3 weeks despite using his home inhaler.  A presentation, febrile with T-max 102.8, tachycardic 131 with audible wheezing on exam. He was admitted for asthma exacerbation and community-acquired pneumonia and treated with IV steroids, IV antibiotics empirically for community-acquired pneumonia, and bronchodilators.     1/11 Medically stable to be discharged today.   Patient Report:  Patient seen in AM. He endorses ongoing depressive sx but denies SI, HI, and AH/VH - isolated VH with good reality differentiation at some point overnight. He is future oriented throughout interview  noting he is hoping to make it to a career fair later today. States he has a safe place to stay after dc and denies access to gun; verbally states he would be able to reach out to someone or [facility] if he has worsening of depressive symptoms. Apologized for risperidone not being  ordered and had thorough discussion of r/b/se. Discussed that we cannot start depakote in this setting due to need for ongoing lab monitoring but will be able to start if indicated as outpt.   ROS:  Feeling better, some ongoing chest pain worse with coughing  Collateral information:  None  Psychiatric History:  Information collected from pt, no info in chart  Psych dx/sx: bipolar disorder Has not seen a psychiatrist or therapist since he got out of jail 8 years ago In prison on risperidone, depakote, oxcarb and did fairly well - also on quetiapine and olanzapine which were overly sedating. 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 Raised muslim, currently believes in "right and wrong" Has GED, multiple technical certifications.   Family History:  1 47 y/o daughter with bipolar disorder, persistent suicidality, and at least 3-4 hospitalizations and attempts.  Brother with bipolar disorder + schizophrenia  The patient's family history is not on file.  Medical History: Past Medical History:  Diagnosis Date   Asthma     Surgical History: Past Surgical History:  Procedure Laterality Date   ANKLE SURGERY     SKIN GRAFT     SKULL FRACTURE ELEVATION     pt unsure of specifics    Medications:   Current Facility-Administered Medications:    acetaminophen (TYLENOL) tablet 650 mg, 650 mg, Oral, Q6H PRN, 650 mg at 02/08/21 0924 **OR** acetaminophen (TYLENOL) suppository 650 mg, 650 mg, Rectal, Q6H PRN, Keith Henry, Keith N, MD   albuterol (PROVENTIL) (2.5 MG/3ML) 0.083% nebulizer  solution 2.5 mg, 2.5 mg, Nebulization, Q2H PRN, Keith Henry, Keith N, MD, 2.5 mg at 02/13/21 0634   budesonide (PULMICORT) nebulizer solution 0.25 mg, 0.25 mg, Nebulization, BID, Midge MiniumKakrakandy, Keith N, MD, 0.25 mg at 02/13/21 0802   enoxaparin (LOVENOX) injection 40 mg, 40 mg, Subcutaneous, Q24H, Keith Henry, Keith N, MD, 40 mg at 02/12/21 1829   ipratropium-albuterol (DUONEB) 0.5-2.5 (3) MG/3ML nebulizer solution 3 mL, 3 mL, Nebulization, TID, Hall, Carole N, DO, 3 mL at 02/13/21 0802   nicotine (NICODERM CQ - dosed in mg/24 hours) patch 21 mg, 21 mg, Transdermal, Daily, Vann, Jessica U, DO, 21 mg at 02/13/21 0942   polyethylene glycol (MIRALAX / GLYCOLAX) packet 17 g, 17 g, Oral, Daily, Vann, Jessica U, DO, 17 g at 02/11/21 1027   traMADol (ULTRAM) tablet 50 mg, 50 mg, Oral, Q6H PRN, Dahal, Binaya, MD, 50 mg at 02/08/21 2132  Current Outpatient Medications:    risperiDONE (RISPERDAL) 1 MG tablet, Take 1 tablet (1 mg total) by mouth at bedtime for 14 days., Disp: 14 tablet, Rfl: 0   albuterol (PROVENTIL) (2.5 MG/3ML) 0.083% nebulizer solution, use 1 vial (2.5 mg total) by nebulization every 4 (four) hours as needed for wheezing., Disp: 90 mL, Rfl: 0   albuterol (VENTOLIN  HFA) 108 (90 Base) MCG/ACT inhaler, Inhale 1-2 puffs into the lungs every 6 (six) hours as needed for wheezing or shortness of breath., Disp: 18 g, Rfl: 0  Allergies: Allergies  Allergen Reactions   Other Anaphylaxis    Pork   Methylprednisolone Sodium Succ Nausea And Vomiting       Objective  Vital signs:  Temp:  [97.7 F (36.5 C)-98.7 F (37.1 C)] 98.7 F (37.1 C) (01/11 0757) Pulse Rate:  [74-95] 91 (01/11 0757) Resp:  [17-18] 18 (01/11 0757) BP: (111-126)/(54-83) 111/54 (01/11 0757) SpO2:  [89 %-97 %] 96 % (01/11 0757)   Psychiatric Specialty Exam:  Presentation  General Appearance: Appropriate for Environment; Casual Eye Contact:Good Speech:Clear and Coherent Speech Volume:Normal Handedness:No data  recorded  Mood and Affect  Mood:Depressed Affect:Congruent  Thought Process  Thought Processes:Coherent; Goal Directed; Linear (future oriented) Descriptions of Associations:Intact  Orientation:Full (Time, Place and Person)  Thought Content:-- (devoid of SI, HI, current delusions)  History of Schizophrenia/Schizoaffective disorder:No data recorded Duration of Psychotic Symptoms:No data recorded Hallucinations:Hallucinations: -- (isolated VH overnight with good reality differntiation, none at time of exam)  Ideas of Reference:None  Suicidal Thoughts:Suicidal Thoughts: No  Homicidal Thoughts:Homicidal Thoughts: No   Sensorium  Memory:Immediate Good; Recent Good; Remote Good Judgment:Good Insight:Good  Executive Functions  Concentration:Good Attention Span:Good Recall:Good Fund of Knowledge:Good Language:Good  Psychomotor Activity  Psychomotor Activity:Psychomotor Activity: Normal  Assets  Assets:Communication Skills; Desire for Improvement; Vocational/Educational  Sleep  Sleep:Sleep: -- (too much lately)   Physical Exam: Physical Exam Review of Systems  Respiratory:  Positive for cough.   Cardiovascular:  Positive for chest pain.       CP worse with coughing   Blood pressure (!) 111/54, pulse 91, temperature 98.7 F (37.1 C), temperature source Oral, resp. rate 18, height 5\' 10"  (1.778 m), weight 91.6 kg, SpO2 96 %. Body mass index is 28.98 kg/m.

## 2021-02-13 NOTE — Progress Notes (Signed)
Pr requesting nebulizer treatment for discharge, RN notified MD, prescription written for [facility] pharmacy pt, awaiting medication for discharge

## 2021-03-20 ENCOUNTER — Inpatient Hospital Stay: Payer: BC Managed Care – PPO | Admitting: Family Medicine

## 2021-03-26 ENCOUNTER — Other Ambulatory Visit: Payer: Self-pay

## 2021-03-26 ENCOUNTER — Other Ambulatory Visit (HOSPITAL_COMMUNITY): Payer: Self-pay | Admitting: Family Medicine

## 2021-03-26 ENCOUNTER — Ambulatory Visit (HOSPITAL_COMMUNITY)
Admission: RE | Admit: 2021-03-26 | Discharge: 2021-03-26 | Disposition: A | Discharge status: Home / Self Care | Payer: Self-pay | Source: Ambulatory Visit | Attending: Family Medicine | Admitting: Family Medicine

## 2021-03-26 DIAGNOSIS — R7611 Nonspecific reaction to tuberculin skin test without active tuberculosis: Secondary | ICD-10-CM

## 2021-04-14 ENCOUNTER — Other Ambulatory Visit: Payer: Self-pay

## 2021-04-14 ENCOUNTER — Emergency Department (HOSPITAL_COMMUNITY)
Admission: EM | Admit: 2021-04-14 | Discharge: 2021-04-14 | Disposition: A | Discharge status: Home / Self Care | Payer: Self-pay | Attending: Emergency Medicine | Admitting: Emergency Medicine

## 2021-04-14 ENCOUNTER — Emergency Department (HOSPITAL_COMMUNITY): Payer: Self-pay

## 2021-04-14 DIAGNOSIS — J45909 Unspecified asthma, uncomplicated: Secondary | ICD-10-CM | POA: Insufficient documentation

## 2021-04-14 DIAGNOSIS — R0602 Shortness of breath: Secondary | ICD-10-CM | POA: Insufficient documentation

## 2021-04-14 LAB — COMPREHENSIVE METABOLIC PANEL
ALT: 12 U/L (ref 0–44)
AST: 19 U/L (ref 15–41)
Albumin: 3.1 g/dL — ABNORMAL LOW (ref 3.5–5.0)
Alkaline Phosphatase: 75 U/L (ref 38–126)
Anion gap: 7 (ref 5–15)
BUN: 13 mg/dL (ref 6–20)
CO2: 25 mmol/L (ref 22–32)
Calcium: 8.1 mg/dL — ABNORMAL LOW (ref 8.9–10.3)
Chloride: 107 mmol/L (ref 98–111)
Creatinine, Ser: 1.09 mg/dL (ref 0.61–1.24)
GFR, Estimated: >60 mL/min (ref >60)
Glucose, Bld: 95 mg/dL (ref 70–99)
Potassium: 4.3 mmol/L (ref 3.5–5.1)
Sodium: 139 mmol/L (ref 135–145)
Total Bilirubin: 0.7 mg/dL (ref 0.3–1.2)
Total Protein: 5.4 g/dL — ABNORMAL LOW (ref 6.5–8.1)

## 2021-04-14 LAB — CBC WITH DIFFERENTIAL/PLATELET
Abs Immature Granulocytes: 0.02 10*3/uL (ref 0.00–0.07)
Basophils Absolute: 0.1 10*3/uL (ref 0.0–0.1)
Basophils Relative: 1 %
Eosinophils Absolute: 0.6 10*3/uL — ABNORMAL HIGH (ref 0.0–0.5)
Eosinophils Relative: 7 %
HCT: 39.5 % (ref 39.0–52.0)
Hemoglobin: 13.1 g/dL (ref 13.0–17.0)
Immature Granulocytes: 0 %
Lymphocytes Relative: 22 %
Lymphs Abs: 1.9 10*3/uL (ref 0.7–4.0)
MCH: 32.3 pg (ref 26.0–34.0)
MCHC: 33.2 g/dL (ref 30.0–36.0)
MCV: 97.5 fL (ref 80.0–100.0)
Monocytes Absolute: 0.8 10*3/uL (ref 0.1–1.0)
Monocytes Relative: 9 %
Neutro Abs: 5.2 10*3/uL (ref 1.7–7.7)
Neutrophils Relative %: 61 %
Platelets: 282 10*3/uL (ref 150–400)
RBC: 4.05 MIL/uL — ABNORMAL LOW (ref 4.22–5.81)
RDW: 12.1 % (ref 11.5–15.5)
WBC: 8.6 10*3/uL (ref 4.0–10.5)
nRBC: 0 % (ref 0.0–0.2)

## 2021-04-14 LAB — I-STAT VENOUS BLOOD GAS, ED
Acid-Base Excess: 1 mmol/L (ref 0.0–2.0)
Bicarbonate: 26.8 mmol/L (ref 20.0–28.0)
Calcium, Ion: 1.16 mmol/L (ref 1.15–1.40)
HCT: 39 % (ref 39.0–52.0)
Hemoglobin: 13.3 g/dL (ref 13.0–17.0)
O2 Saturation: 77 %
Potassium: 4.3 mmol/L (ref 3.5–5.1)
Sodium: 141 mmol/L (ref 135–145)
TCO2: 28 mmol/L (ref 22–32)
pCO2, Ven: 47.5 mmHg (ref 44–60)
pH, Ven: 7.359 (ref 7.25–7.43)
pO2, Ven: 44 mmHg (ref 32–45)

## 2021-04-14 LAB — MAGNESIUM: Magnesium: 1.9 mg/dL (ref 1.7–2.4)

## 2021-04-14 MED ORDER — PREDNISONE 20 MG PO TABS
60.0000 mg | ORAL_TABLET | Freq: Once | ORAL | Status: AC
  Administered 2021-04-14: 60 mg via ORAL
  Filled 2021-04-14: qty 3

## 2021-04-14 MED ORDER — PREDNISONE 20 MG PO TABS
40.0000 mg | ORAL_TABLET | Freq: Every day | ORAL | 0 refills | Status: AC
Start: 2021-04-14 — End: 2021-04-19

## 2021-04-14 MED ORDER — ALBUTEROL SULFATE HFA 108 (90 BASE) MCG/ACT IN AERS: 1.0000 | INHALATION_SPRAY | Freq: Four times a day (QID) | RESPIRATORY_TRACT | 0 refills | Status: DC | PRN

## 2021-04-14 MED ORDER — AEROCHAMBER PLUS FLO-VU LARGE MISC
Status: AC
  Administered 2021-04-14: 1
  Filled 2021-04-14: qty 1

## 2021-04-14 MED ORDER — ALBUTEROL SULFATE HFA 108 (90 BASE) MCG/ACT IN AERS
2.0000 | INHALATION_SPRAY | RESPIRATORY_TRACT | Status: DC | PRN
  Administered 2021-04-14: 2 via RESPIRATORY_TRACT
  Filled 2021-04-14 (×2): qty 6.7

## 2021-04-14 NOTE — Discharge Instructions (Addendum)
Your laboratory results were within normal limits today. The chest xray did not show any signs of infection.  ? ?I have provided a refill for your albuterol inhaler. In addition, you were given a prescription for steroids in order to help with your breathing. Please be aware this medication can make you flush, cause appetite changes or/and insomnia.  ? ?Please make an appointment with your PCP in order to have better control of your asthma.  ?

## 2021-04-14 NOTE — ED Notes (Addendum)
Pt does report improvement in breathing from MDI that was given previously. Pt falling asleep during exam.  ?

## 2021-04-14 NOTE — ED Provider Notes (Cosign Needed)
[facility] EMERGENCY DEPARTMENT Provider Note   CSN: [id number] Arrival date & time: 04/14/21  1519     History  Chief Complaint  Patient presents with   Shortness of Breath    Asthma    Keith Henry is a 46 y.o. male.  46 y.o male with a PMH of Asthma presents to the ED with a chief complaint of shortness of breath x 2 days. He reports running out of his inhaler about two days ago. He states, his breathing "asthma" has been worsening over the last two days, he feels like this is making him more sleepy. He tried using his inhaler but did not have any improvement in symptoms. He does endorse tobacco use, a "few" cigarettes a day. He denies any fever, chest pain or prior hx of blood clots.   The history is provided by the patient and medical records.  Shortness of Breath Severity:  Moderate Onset quality:  Sudden Duration:  2 days Timing:  Constant Progression:  Unchanged Chronicity:  New Relieved by:  Inhaler Worsened by:  Activity Ineffective treatments:  Inhaler Associated symptoms: no abdominal pain, no chest pain, no cough, no fever, no sore throat and no vomiting   Risk factors: tobacco use   Risk factors: no recent alcohol use, no family hx of DVT and no hx of PE/DVT       Home Medications Prior to Admission medications   Medication Sig Start Date End Date Taking? Authorizing Provider  albuterol (VENTOLIN HFA) 108 (90 Base) MCG/ACT inhaler Inhale 1-2 puffs into the lungs every 6 (six) hours as needed for wheezing or shortness of breath. 04/14/21 05/14/21 Yes Emily Massar, Beverley Fiedler, PA-C  predniSONE (DELTASONE) 20 MG tablet Take 2 tablets (40 mg total) by mouth daily for 5 days. 04/14/21 04/19/21 Yes Avyukt Cimo, PA-C  risperiDONE (RISPERDAL) 1 MG tablet Take 1 tablet (1 mg total) by mouth at bedtime for 14 days. 02/13/21 02/27/21  Elgergawy, Silver Huguenin, MD      Allergies    Other and Methylprednisolone sodium succ    Review of Systems   Review of Systems   Constitutional:  Negative for fever.  HENT:  Negative for sore throat.   Respiratory:  Positive for shortness of breath. Negative for cough.   Cardiovascular:  Negative for chest pain.  Gastrointestinal:  Negative for abdominal pain, nausea and vomiting.  Genitourinary:  Negative for flank pain.  Musculoskeletal:  Negative for back pain.  Skin:  Negative for pallor and wound.  All other systems reviewed and are negative.  Physical Exam Updated Vital Signs BP 116/76    Pulse (!) 58    Temp 97.6 F (36.4 C) (Oral)    Resp 16    SpO2 99%  Physical Exam Vitals and nursing note reviewed.  Constitutional:      Comments: Very drowsy during our interview.   HENT:     Head: Normocephalic and atraumatic.  Cardiovascular:     Rate and Rhythm: Normal rate.  Pulmonary:     Effort: Pulmonary effort is normal.     Breath sounds: Examination of the right-upper field reveals decreased breath sounds. Examination of the left-upper field reveals decreased breath sounds. Examination of the right-middle field reveals decreased breath sounds. Examination of the right-lower field reveals decreased breath sounds. Examination of the left-lower field reveals decreased breath sounds. Decreased breath sounds present.  Chest:     Chest wall: No tenderness.  Abdominal:     Palpations: Abdomen is soft.  Tenderness: There is no abdominal tenderness.  Musculoskeletal:     Right lower leg: No tenderness. No edema.     Left lower leg: No tenderness. No edema.  Skin:    General: Skin is warm and dry.  Neurological:     Mental Status: He is alert and oriented to person, place, and time.    ED Results / Procedures / Treatments   Labs (all labs ordered are listed, but only abnormal results are displayed) Labs Reviewed  CBC WITH DIFFERENTIAL/PLATELET - Abnormal; Notable for the following components:      Result Value   RBC 4.05 (*)    Eosinophils Absolute 0.6 (*)    All other components within normal  limits  COMPREHENSIVE METABOLIC PANEL - Abnormal; Notable for the following components:   Calcium 8.1 (*)    Total Protein 5.4 (*)    Albumin 3.1 (*)    All other components within normal limits  MAGNESIUM  BLOOD GAS, VENOUS  I-STAT VENOUS BLOOD GAS, ED    EKG None  Radiology DG Chest 2 View  Result Date: 04/14/2021 CLINICAL DATA:  Acute shortness of breath. EXAM: CHEST - 2 VIEW COMPARISON:  03/26/2021 prior studies FINDINGS: The cardiomediastinal silhouette is unremarkable. There is no evidence of focal airspace disease, pulmonary edema, suspicious pulmonary nodule/mass, pleural effusion, or pneumothorax. No acute bony abnormalities are identified. IMPRESSION: No active cardiopulmonary disease. Electronically Signed   By: Margarette Canada M.D.   On: 04/14/2021 17:35    Procedures Procedures    Medications Ordered in ED Medications  albuterol (VENTOLIN HFA) 108 (90 Base) MCG/ACT inhaler 2 puff (2 puffs Inhalation Given 04/14/21 1539)  AeroChamber Plus Flo-Vu Large MISC (1 each  Given 04/14/21 1616)  predniSONE (DELTASONE) tablet 60 mg (60 mg Oral Given 04/14/21 1739)    ED Course/ Medical Decision Making/ A&P                           Medical Decision Making Amount and/or Complexity of Data Reviewed Labs: ordered. Radiology: ordered.  Risk Prescription drug management.   This patient presents to the ED for concern of shortness of breath, this involves a number of treatment options, and is a complaint that carries with it a high risk of complications and morbidity.  The differential diagnosis includes asthma exacerbation, pneumonia, covid 19 infection   Co morbidities: Discussed in HPI   Brief History:  Patient with a previous history of asthma presents to the ED with a chief complaint of shortness of breath, has been out of his inhaler for the past day.  Complains of worsening shortness of breath with ambulation.  He has not tried any medication from his inhaler.  He denies  any cough, fevers, other symptoms  EMR reviewed including pt PMHx, past surgical history and past visits to ER.   See HPI for more details   Lab Tests:  I ordered and independently interpreted labs.  The pertinent results include:    I personally reviewed all laboratory work and imaging. Metabolic panel without any acute abnormality specifically kidney function within normal limits and no significant electrolyte abnormalities. CBC without leukocytosis or significant anemia.VBG within normal limits.    Imaging Studies:  NAD. I personally reviewed all imaging studies and no acute abnormality found. I agree with radiology interpretation.    Cardiac Monitoring:  The patient was maintained on a cardiac monitor.  I personally viewed and interpreted the cardiac monitored which showed an  underlying rhythm of:  NSR EKG non-ischemic   Medicines ordered:  I ordered medication including albuterol, prednisone  for exacerbation of asthma Reevaluation of the patient after these medicines showed that the patient improved I have reviewed the patients home medicines and have made adjustments as needed    Reevaluation:  After the interventions noted above I re-evaluated patient and found that they have :improved   Social Determinants of Health:  The patient's social determinants of health were a factor in the care of this patient   Problem List / ED Course:  Patient with underlying history of asthma presents to the ED for shortness of breath, after running out of his inhaler.  Arrived hemodynamically stable, oxygen saturation is around 99%, labs are overall reassuring without any signs of hypercarbia.  No infection noted on chest x-ray, satting at 99% on room air and ambulation.  He is to go home with a new albuterol prescription, steroids to help with exacerbation of his asthma.  Did discuss with him at length the importance of following up with PCP in order to obtain better care of his  asthma.   Dispostion:  After consideration of the diagnostic results and the patients response to treatment, I feel that the patent would benefit from outpatient treatment with steroids and inhaler.     Portions of this note were generated with Lobbyist. Dictation errors may occur despite best attempts at proofreading.   Final Clinical Impression(s) / ED Diagnoses Final diagnoses:  SOB (shortness of breath)    Rx / DC Orders ED Discharge Orders          Ordered    albuterol (VENTOLIN HFA) 108 (90 Base) MCG/ACT inhaler  Every 6 hours PRN        04/14/21 1744    predniSONE (DELTASONE) 20 MG tablet  Daily        04/14/21 1744              Janeece Fitting, PA-C 04/14/21 1758

## 2021-04-14 NOTE — ED Triage Notes (Signed)
Pt here for shob, says he is having an asthma attack since 2 days ago. Pt ran out of his albuterol inhaler. Pt reports pain in R ribcage. ?

## 2021-04-14 NOTE — ED Notes (Signed)
Pt provided discharge instructions and prescription information. Pt was given the opportunity to ask questions and questions were answered. Discharge signature not obtained in the setting of the COVID-19 pandemic in order to reduce high touch surfaces.  ° °

## 2021-06-21 ENCOUNTER — Emergency Department (HOSPITAL_COMMUNITY): Admission: EM | Admit: 2021-06-21 | Discharge: 2021-06-21 | Discharge status: Against Medical Advice | Payer: Self-pay

## 2021-06-21 ENCOUNTER — Other Ambulatory Visit: Payer: Self-pay

## 2021-06-21 NOTE — ED Notes (Signed)
LWBS 

## 2021-06-23 ENCOUNTER — Encounter (HOSPITAL_COMMUNITY): Payer: Self-pay | Admitting: Emergency Medicine

## 2021-06-23 ENCOUNTER — Other Ambulatory Visit: Payer: Self-pay

## 2021-06-23 ENCOUNTER — Emergency Department (HOSPITAL_COMMUNITY)
Admission: EM | Admit: 2021-06-23 | Discharge: 2021-06-23 | Disposition: A | Discharge status: Home / Self Care | Payer: Self-pay | Attending: Emergency Medicine | Admitting: Emergency Medicine

## 2021-06-23 DIAGNOSIS — J4521 Mild intermittent asthma with (acute) exacerbation: Secondary | ICD-10-CM | POA: Insufficient documentation

## 2021-06-23 DIAGNOSIS — Z87891 Personal history of nicotine dependence: Secondary | ICD-10-CM | POA: Insufficient documentation

## 2021-06-23 MED ORDER — AEROCHAMBER PLUS FLO-VU MISC
1.0000 | Freq: Once | Status: AC
Start: 2021-06-23 — End: 2021-06-23

## 2021-06-23 MED ORDER — DEXAMETHASONE SODIUM PHOSPHATE 10 MG/ML IJ SOLN
10.0000 mg | Freq: Once | INTRAMUSCULAR | Status: DC
Start: 2021-06-23 — End: 2021-06-23

## 2021-06-23 MED ORDER — IPRATROPIUM-ALBUTEROL 0.5-2.5 (3) MG/3ML IN SOLN
3.0000 mL | Freq: Once | RESPIRATORY_TRACT | Status: AC
  Administered 2021-06-23: 3 mL via RESPIRATORY_TRACT
  Filled 2021-06-23: qty 3

## 2021-06-23 MED ORDER — ALBUTEROL SULFATE HFA 108 (90 BASE) MCG/ACT IN AERS
2.0000 | INHALATION_SPRAY | Freq: Once | RESPIRATORY_TRACT | Status: AC
  Administered 2021-06-23: 2 via RESPIRATORY_TRACT
  Filled 2021-06-23: qty 6.7

## 2021-06-23 MED ORDER — AEROCHAMBER PLUS FLO-VU LARGE MISC
Status: AC
  Administered 2021-06-23: 1
  Filled 2021-06-23: qty 1

## 2021-06-23 MED ORDER — ALBUTEROL SULFATE HFA 108 (90 BASE) MCG/ACT IN AERS
1.0000 | INHALATION_SPRAY | Freq: Four times a day (QID) | RESPIRATORY_TRACT | 2 refills | Status: DC | PRN
Start: 2021-06-23 — End: 2021-11-14

## 2021-06-23 MED ORDER — DEXAMETHASONE SODIUM PHOSPHATE 10 MG/ML IJ SOLN
10.0000 mg | Freq: Once | INTRAMUSCULAR | Status: AC
  Administered 2021-06-23: 10 mg via INTRAMUSCULAR
  Filled 2021-06-23: qty 1

## 2021-06-23 NOTE — ED Triage Notes (Signed)
Pt states that his asthma has been flaring up for the past 3-4 days. States that he feels more ShOB. Hx of asthma.

## 2021-06-23 NOTE — Discharge Instructions (Addendum)
We have given you a steroid injection along with a breathing treatment here in the emergency department.  Additionally have sent you home with an inhaler that you can take as needed.  I have sent you in some refills as well to your pharmacy.  Please follow-up with your primary care doctor as needed.  If you have worsening symptoms or shortness of breath that is not responding to the inhaler, please return to the emergency department.

## 2021-06-23 NOTE — ED Provider Notes (Signed)
[facility] EMERGENCY DEPARTMENT Provider Note   CSN: [id number] Arrival date & time: 06/23/21  [id number]     History  Chief Complaint  Patient presents with   Asthma    Keith Henry is a 46 y.o. male with history of asthma presents to the ED for evaluation of asthma exacerbation.  Patient states that has been flaring up for about 3 to 4 days, but he ran out of his inhaler 3 days ago.  He states that if he feels chest tightness with some shortness of breath.  he denies fevers,  chills, congestion, cough, abdominal pain,, nausea, vomiting, diarrhea.   Asthma      Home Medications Prior to Admission medications   Medication Sig Start Date End Date Taking? Authorizing Provider  albuterol (VENTOLIN HFA) 108 (90 Base) MCG/ACT inhaler Inhale 1-2 puffs into the lungs every 6 (six) hours as needed for wheezing or shortness of breath. 06/23/21 07/23/21  Kathe Becton R, PA-C  risperiDONE (RISPERDAL) 1 MG tablet Take 1 tablet (1 mg total) by mouth at bedtime for 14 days. 02/13/21 02/27/21  Elgergawy, Silver Huguenin, MD      Allergies    Other and Methylprednisolone sodium succ    Review of Systems   Review of Systems  Physical Exam Updated Vital Signs BP 113/79   Pulse 67   Temp 97.8 F (36.6 C) (Oral)   Resp 18   Ht 5\' 10"  (1.778 m)   Wt 91.6 kg   SpO2 100%   BMI 28.98 kg/m  Physical Exam Vitals and nursing note reviewed.  Constitutional:      General: He is not in acute distress.    Appearance: He is not ill-appearing.  HENT:     Head: Atraumatic.  Eyes:     Conjunctiva/sclera: Conjunctivae normal.  Cardiovascular:     Rate and Rhythm: Normal rate and regular rhythm.     Pulses: Normal pulses.     Heart sounds: No murmur heard. Pulmonary:     Effort: Pulmonary effort is normal. No respiratory distress.     Comments: No increased work of breathing.  Expiratory wheezes in the right lung fields Abdominal:     General: Abdomen is flat. There is no  distension.     Palpations: Abdomen is soft.     Tenderness: There is no abdominal tenderness.  Musculoskeletal:        General: Normal range of motion.     Cervical back: Normal range of motion.  Skin:    General: Skin is warm and dry.     Capillary Refill: Capillary refill takes less than 2 seconds.  Neurological:     General: No focal deficit present.     Mental Status: He is alert.  Psychiatric:        Mood and Affect: Mood normal.    ED Results / Procedures / Treatments   Labs (all labs ordered are listed, but only abnormal results are displayed) Labs Reviewed - No data to display  EKG None  Radiology No results found.  Procedures Procedures    Medications Ordered in ED Medications  ipratropium-albuterol (DUONEB) 0.5-2.5 (3) MG/3ML nebulizer solution 3 mL (3 mLs Nebulization Given 06/23/21 0811)  dexamethasone (DECADRON) injection 10 mg (10 mg Intramuscular Given 06/23/21 0811)  albuterol (VENTOLIN HFA) 108 (90 Base) MCG/ACT inhaler 2 puff (2 puffs Inhalation Given 06/23/21 0841)  aerochamber plus with mask device 1 each (1 each Other Given 06/23/21 [id number])    ED Course/ Medical  Decision Making/ A&P                           Medical Decision Making Risk Prescription drug management.   Social determinants of health:  Social History   Socioeconomic History   Marital status: Single    Spouse name: Not on file   Number of children: Not on file   Years of education: Not on file   Highest education level: Not on file  Occupational History   Not on file  Tobacco Use   Smoking status: Former    Packs/day: 2.00    Types: Cigarettes    Quit date: 09/23/2020    Years since quitting: 0.7   Smokeless tobacco: Never  Vaping Use   Vaping Use: Never used  Substance and Sexual Activity   Alcohol use: Yes    Alcohol/week: 2.0 standard drinks    Types: 2 Standard drinks or equivalent per week    Comment: occasion   Drug use: Yes    Types: Marijuana   Sexual  activity: Not on file  Other Topics Concern   Not on file  Social History Narrative   ** Merged History Encounter **       Social Determinants of Health   Financial Resource Strain: Not on file  Food Insecurity: Not on file  Transportation Needs: Not on file  Physical Activity: Not on file  Stress: Not on file  Social Connections: Not on file  Intimate Partner Violence: Not on file     Initial impression:  This patient presents to the ED for concern of asthma , this involves an extensive number of treatment options, and is a complaint that carries with it a high risk of complications and morbidity.   Differentials include asthma exacerbation, viral URI, pneumonia.   Comorbidities affecting care:  Asthma without inhaler at home   Cardiac Monitoring:  The patient was maintained on a cardiac monitor.  I personally viewed and interpreted the cardiac monitored which showed an underlying rhythm of: Sinus rhythm   Medicines ordered and prescription drug management:  I ordered medication including: Decadron 10 mg DuoNeb Albuterol inhaler Reevaluation of the patient after these medicines showed that the patient improved I have reviewed the patients home medicines and have made adjustments as needed   ED Course/Re-evaluation: 46 year old male in no acute distress, nontoxic-appearing presents to the ED for evaluation of an asthma flareup.  Vitals without significant abnormality.  His oxygen is at 97% on room air.  On physical exam, patient is overall well-appearing although has very mild expiratory wheezing in the right lung fields.  Patient requests avoiding lab work and chest x-ray in order to facilitate quicker treatment so that he can be discharged in order to go to work.  Patient works here at [employer] in [employer].  Given that he is overall well-appearing and not complaining of fevers, cough or other underlying causes of his shortness of breath, I think that  this is reasonable.  Concern for ACS or PE is low given his vitals and clinical exam.  I ordered Decadron 10 mg IM along with a DuoNeb treatment. Patient requesting discharge shortly after DuoNeb and Decadron.  Dr. Jeanell Sparrow evaluated patient and reports his lungs sound clear.  Patient given albuterol inhaler and spacer here in the emergency department.  I also sent him home with a few refills should he run out again.  Return cautions were discussed.  Disposition:  After consideration of the diagnostic results, physical exam, history and the patients response to treatment feel that the patent would benefit from discharge.   Asthma exacerbation: Plan and management as described above. Discharged home in good condition.    Final Clinical Impression(s) / ED Diagnoses Final diagnoses:  Mild intermittent asthma with exacerbation    Rx / DC Orders ED Discharge Orders          Ordered    albuterol (VENTOLIN HFA) 108 (90 Base) MCG/ACT inhaler  Every 6 hours PRN        06/23/21 0830              Tonye Pearson, [place] 06/23/21 [id number]    Pattricia Boss, MD 06/24/21 1323

## 2021-07-08 ENCOUNTER — Other Ambulatory Visit: Payer: Self-pay

## 2021-07-08 ENCOUNTER — Emergency Department (HOSPITAL_COMMUNITY): Payer: Self-pay

## 2021-07-08 ENCOUNTER — Emergency Department (HOSPITAL_COMMUNITY)
Admission: EM | Admit: 2021-07-08 | Discharge: 2021-07-09 | Disposition: A | Discharge status: Home / Self Care | Payer: Self-pay | Attending: Emergency Medicine | Admitting: Emergency Medicine

## 2021-07-08 ENCOUNTER — Encounter (HOSPITAL_COMMUNITY): Payer: Self-pay | Admitting: Emergency Medicine

## 2021-07-08 DIAGNOSIS — J45901 Unspecified asthma with (acute) exacerbation: Secondary | ICD-10-CM | POA: Insufficient documentation

## 2021-07-08 MED ORDER — IPRATROPIUM-ALBUTEROL 0.5-2.5 (3) MG/3ML IN SOLN
3.0000 mL | Freq: Once | RESPIRATORY_TRACT | Status: AC
Start: 2021-07-08 — End: 2021-07-08
  Administered 2021-07-08: 3 mL via RESPIRATORY_TRACT
  Filled 2021-07-08: qty 3

## 2021-07-08 MED ORDER — ALBUTEROL SULFATE HFA 108 (90 BASE) MCG/ACT IN AERS
2.0000 | INHALATION_SPRAY | RESPIRATORY_TRACT | Status: DC | PRN
  Administered 2021-07-08: 2 via RESPIRATORY_TRACT
  Filled 2021-07-08: qty 6.7

## 2021-07-08 MED ORDER — PREDNISONE 20 MG PO TABS
60.0000 mg | ORAL_TABLET | Freq: Once | ORAL | Status: AC
Start: 2021-07-08 — End: 2021-07-08
  Administered 2021-07-08: 60 mg via ORAL
  Filled 2021-07-08: qty 3

## 2021-07-08 NOTE — ED Provider Notes (Signed)
[facility] EMERGENCY DEPARTMENT Provider Note   CSN: [id number] Arrival date & time: 07/08/21  2135     History {Add pertinent medical, surgical, social history, OB history to HPI:1} Chief Complaint  Patient presents with   Shortness of Breath   Asthma    APOLO CUTSHAW is a 46 y.o. male with a history of asthma who presents to the ED with complaints of dyspnea x 2 days. Patient reports dyspnea, wheezing, and cough productive of phlegm sputum. Utilizing albuterol with mild relief but has since run out. Feels like prior asthma exacerbations. Denies fever, chest pain, hemoptysis, or leg pain/swelling.   HPI     Home Medications Prior to Admission medications   Medication Sig Start Date End Date Taking? Authorizing Provider  albuterol (VENTOLIN HFA) 108 (90 Base) MCG/ACT inhaler Inhale 1-2 puffs into the lungs every 6 (six) hours as needed for wheezing or shortness of breath. 06/23/21 07/23/21  Raynald Blend R, PA-C  risperiDONE (RISPERDAL) 1 MG tablet Take 1 tablet (1 mg total) by mouth at bedtime for 14 days. 02/13/21 02/27/21  Elgergawy, Leana Roe, MD      Allergies    Other and Methylprednisolone sodium succ    Review of Systems   Review of Systems  Constitutional:  Negative for fever.  Respiratory:  Positive for cough, shortness of breath and wheezing.   Cardiovascular:  Negative for chest pain and leg swelling.  Gastrointestinal:  Negative for abdominal pain, nausea and vomiting.  Neurological:  Negative for syncope.  All other systems reviewed and are negative.  Physical Exam Updated Vital Signs BP (!) 130/93   Pulse 81   Temp 98.4 F (36.9 C) (Oral)   Resp 16   SpO2 99%  Physical Exam Vitals and nursing note reviewed.  Constitutional:      General: He is not in acute distress.    Appearance: He is well-developed. He is not toxic-appearing.  HENT:     Head: Normocephalic and atraumatic.  Eyes:     General:        Right eye: No discharge.         Left eye: No discharge.     Conjunctiva/sclera: Conjunctivae normal.  Cardiovascular:     Rate and Rhythm: Normal rate and regular rhythm.  Pulmonary:     Effort: No respiratory distress.     Breath sounds: Wheezing (expiratory throughout) present. No rales.  Abdominal:     General: There is no distension.     Palpations: Abdomen is soft.     Tenderness: There is no abdominal tenderness.  Musculoskeletal:     Cervical back: Neck supple.  Skin:    General: Skin is warm and dry.  Neurological:     Mental Status: He is alert.     Comments: Clear speech.   Psychiatric:        Behavior: Behavior normal.    ED Results / Procedures / Treatments   Labs (all labs ordered are listed, but only abnormal results are displayed) Labs Reviewed - No data to display  EKG None  Radiology DG Chest 2 View  Result Date: 07/08/2021 CLINICAL DATA:  Shortness of breath EXAM: CHEST - 2 VIEW COMPARISON:  04/14/2021 FINDINGS: The heart size and mediastinal contours are within normal limits. Both lungs are clear. The visualized skeletal structures are unremarkable. IMPRESSION: No active cardiopulmonary disease. Electronically Signed   By: Alcide Clever M.D.   On: 07/08/2021 22:10    Procedures Procedures  {Document  cardiac monitor, telemetry assessment procedure when appropriate:1}  Medications Ordered in ED Medications  albuterol (VENTOLIN HFA) 108 (90 Base) MCG/ACT inhaler 2 puff (2 puffs Inhalation Given 07/08/21 2145)  ipratropium-albuterol (DUONEB) 0.5-2.5 (3) MG/3ML nebulizer solution 3 mL (has no administration in time range)  predniSONE (DELTASONE) tablet 60 mg (has no administration in time range)    ED Course/ Medical Decision Making/ A&P                           Medical Decision Making Amount and/or Complexity of Data Reviewed Radiology: ordered.  Risk Prescription drug management.   ***  {Document critical care time when appropriate:1} {Document review of labs and  clinical decision tools ie heart score, Chads2Vasc2 etc:1}  {Document your independent review of radiology images, and any outside records:1} {Document your discussion with family members, caretakers, and with consultants:1} {Document social determinants of health affecting pt's care:1} {Document your decision making why or why not admission, treatments were needed:1} Final Clinical Impression(s) / ED Diagnoses Final diagnoses:  None    Rx / DC Orders ED Discharge Orders     None

## 2021-07-08 NOTE — ED Provider Triage Note (Signed)
Emergency Medicine Provider Triage Evaluation Note  Keith Henry , a 46 y.o. male  was evaluated in triage.  Pt complains of shortness of shortness of breath.  Pt has asthma   Review of Systems  Positive: cough Negative: fever  Physical Exam  BP (!) 127/95   Pulse (!) 109   Temp 98.4 F (36.9 C) (Oral)   Resp (!) 26   SpO2 98%  Gen:   Awake, no distress   Resp:  Normal effort  MSK:   Moves extremities without difficulty  Other:    Medical Decision Making  Medically screening exam initiated at 9:49 PM.  Appropriate orders placed.  [name] was informed that the remainder of the evaluation will be completed by another provider, this initial triage assessment does not replace that evaluation, and the importance of remaining in the ED until their evaluation is complete.     Elson Areas, [place] 07/08/21 2156

## 2021-07-08 NOTE — ED Triage Notes (Signed)
Pt here for shob x2 days, reports hx of asthma. Pt states he came in tonight because it hasn't gotten better. Pt has audible wheezing in triage

## 2021-07-09 ENCOUNTER — Other Ambulatory Visit (HOSPITAL_COMMUNITY): Payer: Self-pay

## 2021-07-09 MED ORDER — IPRATROPIUM-ALBUTEROL 0.5-2.5 (3) MG/3ML IN SOLN
3.0000 mL | Freq: Once | RESPIRATORY_TRACT | Status: AC
  Administered 2021-07-09: 3 mL via RESPIRATORY_TRACT
  Filled 2021-07-09: qty 3

## 2021-07-09 MED ORDER — PREDNISONE 10 MG PO TABS
40.0000 mg | ORAL_TABLET | Freq: Every day | ORAL | 0 refills | Status: AC
  Filled 2021-07-09: qty 20, 5d supply, fill #0

## 2021-07-09 MED ORDER — ALBUTEROL SULFATE (2.5 MG/3ML) 0.083% IN NEBU
2.5000 mg | INHALATION_SOLUTION | Freq: Four times a day (QID) | RESPIRATORY_TRACT | 0 refills | Status: DC | PRN
  Filled 2021-07-09: qty 90, 8d supply, fill #0

## 2021-07-09 NOTE — Discharge Instructions (Signed)
You were seen in the emergency department today for an asthma exacerbation.  Please take prednisone daily for the next 5 days.  Take this with food as it can cause stomach upset and or stomach bleeding.  Avoid taking NSAIDs such as ibuprofen, Advil, Aleve, Mobic, Goody powder etc. as these are similar in terms of stomach irritation.  Please utilize your albuterol inhaler/nebulizer consistently every 4-6 hours over the next 48 hours then as needed.  We have prescribed you new medication(s) today. Discuss the medications prescribed today with your pharmacist as they can have adverse effects and interactions with your other medicines including over the counter and prescribed medications. Seek medical evaluation if you start to experience new or abnormal symptoms after taking one of these medicines, seek care immediately if you start to experience difficulty breathing, feeling of your throat closing, facial swelling, or rash as these could be indications of a more serious allergic reaction  Follow-up with primary care in 3 days.  Return to the ER for new or worsening symptoms including but not limited to increased work of breathing, chest pain, passing out, coughing up blood, fever, or any other concerns.

## 2021-08-13 ENCOUNTER — Emergency Department (HOSPITAL_COMMUNITY)
Admission: EM | Admit: 2021-08-13 | Discharge: 2021-08-14 | Disposition: A | Discharge status: Home / Self Care | Payer: Self-pay | Attending: Emergency Medicine | Admitting: Emergency Medicine

## 2021-08-13 ENCOUNTER — Encounter (HOSPITAL_COMMUNITY): Payer: Self-pay

## 2021-08-13 ENCOUNTER — Other Ambulatory Visit: Payer: Self-pay

## 2021-08-13 DIAGNOSIS — R109 Unspecified abdominal pain: Secondary | ICD-10-CM | POA: Insufficient documentation

## 2021-08-13 DIAGNOSIS — J4541 Moderate persistent asthma with (acute) exacerbation: Secondary | ICD-10-CM | POA: Insufficient documentation

## 2021-08-13 MED ORDER — IPRATROPIUM-ALBUTEROL 0.5-2.5 (3) MG/3ML IN SOLN
3.0000 mL | Freq: Once | RESPIRATORY_TRACT | Status: AC
Start: 2021-08-13 — End: 2021-08-13
  Administered 2021-08-13: 3 mL via RESPIRATORY_TRACT
  Filled 2021-08-13: qty 3

## 2021-08-13 MED ORDER — ALBUTEROL SULFATE HFA 108 (90 BASE) MCG/ACT IN AERS
INHALATION_SPRAY | RESPIRATORY_TRACT | Status: AC
  Administered 2021-08-13: 2 via RESPIRATORY_TRACT
  Filled 2021-08-13: qty 6.7

## 2021-08-13 MED ORDER — ALBUTEROL SULFATE (2.5 MG/3ML) 0.083% IN NEBU: 2.5000 mg | INHALATION_SOLUTION | RESPIRATORY_TRACT | Status: DC | PRN

## 2021-08-13 MED ORDER — ALBUTEROL SULFATE HFA 108 (90 BASE) MCG/ACT IN AERS
2.0000 | INHALATION_SPRAY | RESPIRATORY_TRACT | Status: DC | PRN
  Administered 2021-08-13: 2 via RESPIRATORY_TRACT

## 2021-08-13 NOTE — ED Triage Notes (Signed)
Pt reports asthma exacerbation, onset 3 days ago. He has not used his inhaler in 2 weeks because he is out of it. He did use his albuterol neb treatment about 15 mins ago. Audible wheezing noted in triage.

## 2021-08-13 NOTE — ED Provider Triage Note (Signed)
Emergency Medicine Provider Triage Evaluation Note  Keith Henry , a 46 y.o. male  was evaluated in triage.  Pt complains of asthma exacerbation for the past 2 days.  Longstanding hx of asthma, no relief with home nebs and is out of rescue inhaler.   Does admit to cleaning today with heavy chemicals in kitchen at his work.  Review of Systems  Positive: Asthma, SOB Negative: fever  Physical Exam  BP (!) 147/92 (BP Location: Left Arm)   Pulse 100   Temp (!) 97.5 F (36.4 C) (Oral)   Resp 20   Ht 5\' 9"  (1.753 m)   Wt 94.8 kg   SpO2 95%   BMI 30.86 kg/m  Gen:   Awake, no distress   Resp:  Normal effort  MSK:   Moves extremities without difficulty  Other:  Diffuse wheezes throughout, able to speak in sentences, O2 95% on RA  Medical Decision Making  Medically screening exam initiated at 10:02 PM.  Appropriate orders placed.  was informed that the remainder of the evaluation will be completed by another provider, this initial triage assessment does not replace that evaluation, and the importance of remaining in the ED until their evaluation is complete.  Asthma exacerbation.  Wheezing noted in triage.  Will start treatment in triage.   [name], PA-C 08/13/21 2207

## 2021-08-14 ENCOUNTER — Other Ambulatory Visit (HOSPITAL_COMMUNITY): Payer: Self-pay

## 2021-08-14 ENCOUNTER — Other Ambulatory Visit (HOSPITAL_COMMUNITY): Payer: Self-pay | Admitting: Student

## 2021-08-14 ENCOUNTER — Emergency Department (HOSPITAL_COMMUNITY): Payer: Self-pay

## 2021-08-14 LAB — CBC WITH DIFFERENTIAL/PLATELET
Abs Immature Granulocytes: 0.03 10*3/uL (ref 0.00–0.07)
Basophils Absolute: 0.1 10*3/uL (ref 0.0–0.1)
Basophils Relative: 1 %
Eosinophils Absolute: 0 10*3/uL (ref 0.0–0.5)
Eosinophils Relative: 0 %
HCT: 40.2 % (ref 39.0–52.0)
Hemoglobin: 13.4 g/dL (ref 13.0–17.0)
Immature Granulocytes: 0 %
Lymphocytes Relative: 8 %
Lymphs Abs: 0.6 10*3/uL — ABNORMAL LOW (ref 0.7–4.0)
MCH: 32.2 pg (ref 26.0–34.0)
MCHC: 33.3 g/dL (ref 30.0–36.0)
MCV: 96.6 fL (ref 80.0–100.0)
Monocytes Absolute: 0.1 10*3/uL (ref 0.1–1.0)
Monocytes Relative: 1 %
Neutro Abs: 6.5 10*3/uL (ref 1.7–7.7)
Neutrophils Relative %: 90 %
Platelets: 298 10*3/uL (ref 150–400)
RBC: 4.16 MIL/uL — ABNORMAL LOW (ref 4.22–5.81)
RDW: 12.2 % (ref 11.5–15.5)
WBC: 7.3 10*3/uL (ref 4.0–10.5)
nRBC: 0 % (ref 0.0–0.2)

## 2021-08-14 LAB — BASIC METABOLIC PANEL
Anion gap: 9 (ref 5–15)
BUN: 12 mg/dL (ref 6–20)
CO2: 22 mmol/L (ref 22–32)
Calcium: 8.8 mg/dL — ABNORMAL LOW (ref 8.9–10.3)
Chloride: 106 mmol/L (ref 98–111)
Creatinine, Ser: 0.99 mg/dL (ref 0.61–1.24)
GFR, Estimated: >60 mL/min (ref >60)
Glucose, Bld: 163 mg/dL — ABNORMAL HIGH (ref 70–99)
Potassium: 4.5 mmol/L (ref 3.5–5.1)
Sodium: 137 mmol/L (ref 135–145)

## 2021-08-14 LAB — TROPONIN I (HIGH SENSITIVITY)
Troponin I (High Sensitivity): <2 ng/L (ref <18)
Troponin I (High Sensitivity): <2 ng/L (ref <18)

## 2021-08-14 LAB — D-DIMER, QUANTITATIVE: D-Dimer, Quant: <0.27 ug/mL-FEU (ref 0.00–0.50)

## 2021-08-14 LAB — BRAIN NATRIURETIC PEPTIDE: B Natriuretic Peptide: 7.3 pg/mL (ref 0.0–100.0)

## 2021-08-14 MED ORDER — DEXAMETHASONE SODIUM PHOSPHATE 10 MG/ML IJ SOLN
10.0000 mg | Freq: Once | INTRAMUSCULAR | Status: AC
  Administered 2021-08-14: 10 mg via INTRAMUSCULAR
  Filled 2021-08-14: qty 1

## 2021-08-14 MED ORDER — PREDNISONE 20 MG PO TABS
60.0000 mg | ORAL_TABLET | Freq: Once | ORAL | Status: AC
  Administered 2021-08-14: 60 mg via ORAL
  Filled 2021-08-14: qty 3

## 2021-08-14 MED ORDER — CYCLOBENZAPRINE HCL 10 MG PO TABS
10.0000 mg | ORAL_TABLET | Freq: Two times a day (BID) | ORAL | 0 refills | Status: DC | PRN
  Filled 2021-08-14: qty 20, 10d supply, fill #0

## 2021-08-14 MED ORDER — MAGNESIUM SULFATE 2 GM/50ML IV SOLN
2.0000 g | Freq: Once | INTRAVENOUS | Status: AC
  Administered 2021-08-14: 2 g via INTRAVENOUS
  Filled 2021-08-14 (×2): qty 50

## 2021-08-14 MED ORDER — FLUTICASONE PROPIONATE HFA 44 MCG/ACT IN AERO
2.0000 | INHALATION_SPRAY | Freq: Two times a day (BID) | RESPIRATORY_TRACT | 12 refills | Status: DC
  Filled 2021-08-14: qty 10.6, 30d supply, fill #0

## 2021-08-14 MED ORDER — BUDESONIDE 0.25 MG/2ML IN SUSP
0.2500 mg | Freq: Two times a day (BID) | RESPIRATORY_TRACT | Status: DC
  Administered 2021-08-14: 0.25 mg via RESPIRATORY_TRACT
  Filled 2021-08-14 (×2): qty 2

## 2021-08-14 MED ORDER — ALBUTEROL SULFATE (2.5 MG/3ML) 0.083% IN NEBU
5.0000 mg | INHALATION_SOLUTION | Freq: Once | RESPIRATORY_TRACT | Status: AC
  Administered 2021-08-14: 5 mg via RESPIRATORY_TRACT
  Filled 2021-08-14: qty 6

## 2021-08-14 MED ORDER — PREDNISONE 10 MG PO TABS
40.0000 mg | ORAL_TABLET | Freq: Every day | ORAL | 0 refills | Status: AC
  Filled 2021-08-14: qty 16, 4d supply, fill #0

## 2021-08-14 MED ORDER — IPRATROPIUM BROMIDE 0.02 % IN SOLN
0.5000 mg | Freq: Once | RESPIRATORY_TRACT | Status: AC
  Administered 2021-08-14: 0.5 mg via RESPIRATORY_TRACT
  Filled 2021-08-14: qty 2.5

## 2021-08-14 MED ORDER — IPRATROPIUM-ALBUTEROL 0.5-2.5 (3) MG/3ML IN SOLN
3.0000 mL | Freq: Once | RESPIRATORY_TRACT | Status: AC
  Administered 2021-08-14: 3 mL via RESPIRATORY_TRACT
  Filled 2021-08-14: qty 3

## 2021-08-14 MED ORDER — IBUPROFEN 400 MG PO TABS
600.0000 mg | ORAL_TABLET | Freq: Once | ORAL | Status: AC
  Administered 2021-08-14: 600 mg via ORAL
  Filled 2021-08-14: qty 1

## 2021-08-14 MED ORDER — AEROCHAMBER PLUS FLO-VU MISC
1.0000 | Freq: Once | Status: DC
Start: 2021-08-14 — End: 2021-08-14

## 2021-08-14 MED ORDER — KETOROLAC TROMETHAMINE 30 MG/ML IJ SOLN
30.0000 mg | Freq: Once | INTRAMUSCULAR | Status: AC
  Administered 2021-08-14: 30 mg via INTRAVENOUS
  Filled 2021-08-14: qty 1

## 2021-08-14 MED ORDER — ALBUTEROL SULFATE HFA 108 (90 BASE) MCG/ACT IN AERS
4.0000 | INHALATION_SPRAY | Freq: Once | RESPIRATORY_TRACT | Status: AC
  Administered 2021-08-14: 4 via RESPIRATORY_TRACT
  Filled 2021-08-14: qty 6.7

## 2021-08-14 MED ORDER — ALBUTEROL SULFATE HFA 108 (90 BASE) MCG/ACT IN AERS: 4.0000 | INHALATION_SPRAY | Freq: Once | RESPIRATORY_TRACT | Status: DC

## 2021-08-14 MED ORDER — FLUTICASONE PROPIONATE HFA 44 MCG/ACT IN AERO: 2.0000 | INHALATION_SPRAY | Freq: Two times a day (BID) | RESPIRATORY_TRACT | Status: DC

## 2021-08-14 MED ORDER — ACETAMINOPHEN 500 MG PO TABS
1000.0000 mg | ORAL_TABLET | Freq: Once | ORAL | Status: AC
Start: 2021-08-14 — End: 2021-08-14
  Administered 2021-08-14: 1000 mg via ORAL
  Filled 2021-08-14: qty 2

## 2021-08-14 NOTE — Discharge Instructions (Addendum)
Use Tylenol every 4 hours as needed for pain and fevers, if you tolerate ibuprofen well you can use that every 6 hours for similar. Use albuterol inhaler every 3-4 hours for wheezing shortness of breath however if no improvement or worsening after 3-4 treatments you need to return the emergency department.   Use fluticasone inhaler twice daily for maintenance - this is different than your albuterol inhaler that you use as needed. Make sure you follow-up closely with the primary doctor for improved asthma management.  If you pass out get very short of breath call the ambulance or return to the ER.

## 2021-08-14 NOTE — ED Notes (Signed)
Pt moved from [facility] to [facility]. Pt care transferred to Adult ED. Report given to IP, RN. Pt alert, NAD, calm, interactive, self transfers. No changes. Reports "Feels better", VSS. Denies questions or needs. Given warm blanket.

## 2021-08-14 NOTE — ED Provider Notes (Signed)
  Physical Exam  BP 133/84   Pulse 83   Temp 98 F (36.7 C) (Temporal)   Resp (!) 21   Ht 5\' 9"  (1.753 m)   Wt 94.8 kg   SpO2 96%   BMI 30.86 kg/m   Physical Exam  Procedures  Procedures  ED Course / MDM    Medical Decision Making Amount and/or Complexity of Data Reviewed Labs: ordered. Radiology: ordered. ECG/medicine tests: ordered.  Risk OTC drugs. Prescription drug management.   Received care of patient from Dr. . Please see his note for prior hx, physical and care.  Briefly this is a 46yo male with history of asthma presents with concern for shortness of breath, wheezing, left flank pain.  EKG with TW changes.  Troponin negative x2.  DDimer negative and have low suspicion for PE.  Doubt dissection, normal bilateral pulses, normal CXR, reproducible pain. Feel history less consistent with nephrolithiasis/intraabdominal etiology. Suspect muscular strain in setting of coughing, pain worsened with palpation.  No sign of pneumonia or pneumothorax.  Breathing improved following meds. Feels back to baseline.  Given albuterol inhaler, rx for prednisone and flexeril. Patient discharged in stable condition with understanding of reasons to return.       46yo, MD 08/15/21 2248

## 2021-08-14 NOTE — ED Provider Notes (Signed)
[facility] EMERGENCY DEPARTMENT Provider Note   CSN: [id number] Arrival date & time: 08/13/21  2110     History  Chief Complaint  Patient presents with   Asthma    Keith Henry is a 46 y.o. male.  Patient presents with breathing difficulty, cough, wheezing and left flank pain with coughing worsening last 2 to 3 days.  Patient had multiple exacerbations of asthma he says this overall feels similar however the discomfort is more significant.  Pain with breath and with coughing.  Patient denies personal blood clot history, recent travel, leg swelling, active cancer or recent surgeries however family members have had blood clots in the past.  No known cardiac history.  Patient does not follow-up regularly with a doctor.  Patient does not have medications for asthma at home at this time.  No fevers.  Patient does have mild productive cough/spitting up at times.  No heart failure history known.       Home Medications Prior to Admission medications   Medication Sig Start Date End Date Taking? Authorizing Provider  albuterol (PROVENTIL) (2.5 MG/3ML) 0.083% nebulizer solution Take 3 mLs (2.5 mg total) by nebulization every 6 (six) hours as needed for wheezing or shortness of breath. 07/09/21   Petrucelli, Samantha R, PA-C  albuterol (VENTOLIN HFA) 108 (90 Base) MCG/ACT inhaler Inhale 1-2 puffs into the lungs every 6 (six) hours as needed for wheezing or shortness of breath. 06/23/21 07/23/21  Raynald Blend R, PA-C  risperiDONE (RISPERDAL) 1 MG tablet Take 1 tablet (1 mg total) by mouth at bedtime for 14 days. 02/13/21 02/27/21  Elgergawy, Leana Roe, MD      Allergies    Other and Methylprednisolone sodium succ    Review of Systems   Review of Systems  Constitutional:  Negative for chills and fever.  HENT:  Positive for congestion.   Eyes:  Negative for visual disturbance.  Respiratory:  Positive for cough and shortness of breath.   Cardiovascular:  Negative for chest  pain.  Gastrointestinal:  Negative for abdominal pain and vomiting.  Genitourinary:  Positive for flank pain. Negative for dysuria.  Musculoskeletal:  Negative for back pain, neck pain and neck stiffness.  Skin:  Negative for rash.  Neurological:  Negative for light-headedness and headaches.    Physical Exam Updated Vital Signs BP 133/78   Pulse 68   Temp 98 F (36.7 C) (Temporal)   Resp (!) 22   Ht 5\' 9"  (1.753 m)   Wt 94.8 kg   SpO2 97%   BMI 30.86 kg/m  Physical Exam Vitals and nursing note reviewed.  Constitutional:      General: He is not in acute distress.    Appearance: He is well-developed.  HENT:     Head: Normocephalic and atraumatic.     Comments: Patient fatigued has been in the waiting room all night.    Mouth/Throat:     Mouth: Mucous membranes are moist.  Eyes:     General:        Right eye: No discharge.        Left eye: No discharge.     Conjunctiva/sclera: Conjunctivae normal.  Neck:     Trachea: No tracheal deviation.  Cardiovascular:     Rate and Rhythm: Normal rate and regular rhythm.     Heart sounds: No murmur heard. Pulmonary:     Effort: Pulmonary effort is normal.     Breath sounds: Wheezing and rhonchi present.  Abdominal:  General: There is no distension.     Palpations: Abdomen is soft.     Tenderness: There is no abdominal tenderness. There is no guarding.  Musculoskeletal:     Cervical back: Normal range of motion and neck supple. No rigidity.  Skin:    General: Skin is warm.     Capillary Refill: Capillary refill takes less than 2 seconds.     Findings: No rash.  Neurological:     General: No focal deficit present.     Mental Status: He is alert.     Cranial Nerves: No cranial nerve deficit.  Psychiatric:        Mood and Affect: Mood normal.     ED Results / Procedures / Treatments   Labs (all labs ordered are listed, but only abnormal results are displayed) Labs Reviewed  CBC WITH DIFFERENTIAL/PLATELET - Abnormal;  Notable for the following components:      Result Value   RBC 4.16 (*)    Lymphs Abs 0.6 (*)    All other components within normal limits  D-DIMER, QUANTITATIVE  BASIC METABOLIC PANEL  BRAIN NATRIURETIC PEPTIDE  URINALYSIS, ROUTINE W REFLEX MICROSCOPIC  RAPID URINE DRUG SCREEN, HOSP PERFORMED  TROPONIN I (HIGH SENSITIVITY)    EKG EKG Interpretation  Date/Time:  Wednesday August 14 2021 10:21:57 EDT Ventricular Rate:  70 PR Interval:  174 QRS Duration: 89 QT Interval:  390 QTC Calculation: 421 R Axis:   40 Text Interpretation: Sinus rhythm Abnormal R-wave progression, early transition Mild STE, biphasic tw V3, similar ST changes inferior leads Confirmed by Alvira Monday ([id number]) on 08/14/2021 11:21:09 AM  Radiology DG Chest Portable 1 View  Result Date: 08/14/2021 CLINICAL DATA:  Cough. EXAM: PORTABLE CHEST 1 VIEW COMPARISON:  Chest x-ray July 08, 2021. FINDINGS: The heart size and mediastinal contours are within normal limits. Both lungs are clear. No visible pleural effusions or pneumothorax. No acute osseous abnormality. IMPRESSION: No evidence of acute cardiopulmonary disease. Electronically Signed   By: Feliberto Harts M.D.   On: 08/14/2021 08:12    Procedures .Critical Care  Performed by: Blane Ohara, MD Authorized by: Blane Ohara, MD   Critical care provider statement:    Critical care time (minutes):  30   Critical care start time:  08/14/2021 10:00 AM   Critical care end time:  08/14/2021 10:30 AM   Critical care time was exclusive of:  Separately billable procedures and treating other patients and teaching time   Critical care was necessary to treat or prevent imminent or life-threatening deterioration of the following conditions:  Respiratory failure   Critical care was time spent personally by me on the following activities:  Development of treatment plan with patient or surrogate, evaluation of patient's response to treatment, examination of patient, ordering  and review of laboratory studies, ordering and review of radiographic studies, ordering and performing treatments and interventions, pulse oximetry and re-evaluation of patient's condition     Medications Ordered in ED Medications  albuterol (VENTOLIN HFA) 108 (90 Base) MCG/ACT inhaler 2 puff (2 puffs Inhalation Given 08/13/21 2225)  aerochamber plus with mask device 1 each (1 each Other Not Given 08/14/21 1015)  albuterol (VENTOLIN HFA) 108 (90 Base) MCG/ACT inhaler 4 puff (4 puffs Inhalation Not Given 08/14/21 1015)  budesonide (PULMICORT) nebulizer solution 0.25 mg (0.25 mg Nebulization Given 08/14/21 0841)  ipratropium-albuterol (DUONEB) 0.5-2.5 (3) MG/3ML nebulizer solution 3 mL (3 mLs Nebulization Given 08/13/21 2216)  predniSONE (DELTASONE) tablet 60 mg (60 mg Oral Given 08/14/21  7564)  ipratropium-albuterol (DUONEB) 0.5-2.5 (3) MG/3ML nebulizer solution 3 mL (3 mLs Nebulization Given 08/14/21 0227)  albuterol (PROVENTIL) (2.5 MG/3ML) 0.083% nebulizer solution 5 mg (5 mg Nebulization Given 08/14/21 0818)  ipratropium (ATROVENT) nebulizer solution 0.5 mg (0.5 mg Nebulization Given 08/14/21 0818)  dexamethasone (DECADRON) injection 10 mg (10 mg Intramuscular Given 08/14/21 0826)  acetaminophen (TYLENOL) tablet 1,000 mg (1,000 mg Oral Given 08/14/21 0817)  ibuprofen (ADVIL) tablet 600 mg (600 mg Oral Given 08/14/21 1040)  albuterol (PROVENTIL) (2.5 MG/3ML) 0.083% nebulizer solution 5 mg (5 mg Nebulization Given 08/14/21 1040)  magnesium sulfate IVPB 2 g 50 mL (0 g Intravenous Stopped 08/14/21 1103)    ED Course/ Medical Decision Making/ A&P                           Medical Decision Making Amount and/or Complexity of Data Reviewed Labs: ordered. Radiology: ordered. ECG/medicine tests: ordered.  Risk OTC drugs. Prescription drug management.   Patient with no current primary doctor, chronic smoking history presents to triage with initial concern for asthma exacerbation likely triggered by  combination of not taking medications at home/virus/smoking.  Patient waiting in the waiting room and brought over to the pediatric ER side for further work-up and evaluation.  Patient received nebulized treatment, dexamethasone intramuscular and Tylenol for pain.  On reassessment patient still having decreased air movement and wheezing bilateral.    On further reassessment patient having left flank pain with a breath and mild anterior mid chest pain.  Discussed differential including musculoskeletal with coughing recently/asthma, effusion, pneumonia, pulm embolism, cardiac.  Blood work to check for blood clots ordered patient low risk D-dimer negative no indication for CT scan at this time.  Repeat nebulizer given. Blood work results reviewed showing normal hemoglobin, normal white blood cell count.  Urinalysis pending.  Magnesium given to help with asthma exacerbation.  EKG reviewed sinus rhythm no acute ST elevation.  Given no significant improvement in patient needing troponin follow-up transferred to green pod on the adult side.  Discussed with Dr. Dalene Seltzer and patient comfortable this plan.        Final Clinical Impression(s) / ED Diagnoses Final diagnoses:  Acute chest pain  Moderate persistent asthma with exacerbation    Rx / DC Orders ED Discharge Orders     None         Blane Ohara, MD 08/14/21 1130

## 2021-08-15 ENCOUNTER — Other Ambulatory Visit (HOSPITAL_COMMUNITY): Payer: Self-pay

## 2021-08-20 ENCOUNTER — Other Ambulatory Visit (HOSPITAL_COMMUNITY): Payer: Self-pay

## 2021-08-20 ENCOUNTER — Other Ambulatory Visit (HOSPITAL_COMMUNITY): Payer: Self-pay | Admitting: Student

## 2021-08-22 ENCOUNTER — Other Ambulatory Visit (HOSPITAL_COMMUNITY): Payer: Self-pay

## 2021-08-23 ENCOUNTER — Other Ambulatory Visit (HOSPITAL_COMMUNITY): Payer: Self-pay

## 2021-08-23 ENCOUNTER — Other Ambulatory Visit (HOSPITAL_COMMUNITY): Payer: Self-pay | Admitting: Student

## 2021-08-29 ENCOUNTER — Other Ambulatory Visit (HOSPITAL_COMMUNITY): Payer: Self-pay

## 2021-09-20 ENCOUNTER — Encounter (HOSPITAL_COMMUNITY): Payer: Self-pay

## 2021-09-20 ENCOUNTER — Emergency Department (HOSPITAL_COMMUNITY)
Admission: EM | Admit: 2021-09-20 | Discharge: 2021-09-20 | Disposition: A | Discharge status: Home / Self Care | Payer: Self-pay | Attending: Emergency Medicine | Admitting: Emergency Medicine

## 2021-09-20 ENCOUNTER — Other Ambulatory Visit: Payer: Self-pay

## 2021-09-20 ENCOUNTER — Emergency Department (HOSPITAL_COMMUNITY): Payer: Self-pay

## 2021-09-20 DIAGNOSIS — J4541 Moderate persistent asthma with (acute) exacerbation: Secondary | ICD-10-CM | POA: Insufficient documentation

## 2021-09-20 DIAGNOSIS — Z7951 Long term (current) use of inhaled steroids: Secondary | ICD-10-CM | POA: Insufficient documentation

## 2021-09-20 DIAGNOSIS — Z20822 Contact with and (suspected) exposure to covid-19: Secondary | ICD-10-CM | POA: Insufficient documentation

## 2021-09-20 LAB — COMPREHENSIVE METABOLIC PANEL
ALT: 9 U/L (ref 0–44)
AST: 19 U/L (ref 15–41)
Albumin: 3.2 g/dL — ABNORMAL LOW (ref 3.5–5.0)
Alkaline Phosphatase: 82 U/L (ref 38–126)
Anion gap: 4 — ABNORMAL LOW (ref 5–15)
BUN: 10 mg/dL (ref 6–20)
CO2: 26 mmol/L (ref 22–32)
Calcium: 8.2 mg/dL — ABNORMAL LOW (ref 8.9–10.3)
Chloride: 110 mmol/L (ref 98–111)
Creatinine, Ser: 1.05 mg/dL (ref 0.61–1.24)
GFR, Estimated: >60 mL/min (ref >60)
Glucose, Bld: 106 mg/dL — ABNORMAL HIGH (ref 70–99)
Potassium: 4.7 mmol/L (ref 3.5–5.1)
Sodium: 140 mmol/L (ref 135–145)
Total Bilirubin: 0.8 mg/dL (ref 0.3–1.2)
Total Protein: 5.6 g/dL — ABNORMAL LOW (ref 6.5–8.1)

## 2021-09-20 LAB — CBC WITH DIFFERENTIAL/PLATELET
Abs Immature Granulocytes: 0.03 10*3/uL (ref 0.00–0.07)
Basophils Absolute: 0.2 10*3/uL — ABNORMAL HIGH (ref 0.0–0.1)
Basophils Relative: 2 %
Eosinophils Absolute: 0.6 10*3/uL — ABNORMAL HIGH (ref 0.0–0.5)
Eosinophils Relative: 6 %
HCT: 44.4 % (ref 39.0–52.0)
Hemoglobin: 14.9 g/dL (ref 13.0–17.0)
Immature Granulocytes: 0 %
Lymphocytes Relative: 19 %
Lymphs Abs: 1.9 10*3/uL (ref 0.7–4.0)
MCH: 32.4 pg (ref 26.0–34.0)
MCHC: 33.6 g/dL (ref 30.0–36.0)
MCV: 96.5 fL (ref 80.0–100.0)
Monocytes Absolute: 0.7 10*3/uL (ref 0.1–1.0)
Monocytes Relative: 7 %
Neutro Abs: 6.4 10*3/uL (ref 1.7–7.7)
Neutrophils Relative %: 66 %
Platelets: 301 10*3/uL (ref 150–400)
RBC: 4.6 MIL/uL (ref 4.22–5.81)
RDW: 12.2 % (ref 11.5–15.5)
WBC: 9.7 10*3/uL (ref 4.0–10.5)
nRBC: 0 % (ref 0.0–0.2)

## 2021-09-20 LAB — SARS CORONAVIRUS 2 BY RT PCR: SARS Coronavirus 2 by RT PCR: NEGATIVE

## 2021-09-20 MED ORDER — FLUTICASONE PROPIONATE HFA 44 MCG/ACT IN AERO: 2.0000 | INHALATION_SPRAY | Freq: Two times a day (BID) | RESPIRATORY_TRACT | 0 refills | Status: DC

## 2021-09-20 MED ORDER — ALBUTEROL SULFATE (2.5 MG/3ML) 0.083% IN NEBU
15.0000 mg/h | INHALATION_SOLUTION | RESPIRATORY_TRACT | Status: DC
  Administered 2021-09-20: 15 mg/h via RESPIRATORY_TRACT
  Filled 2021-09-20: qty 18

## 2021-09-20 MED ORDER — IPRATROPIUM BROMIDE 0.02 % IN SOLN
1.0000 mg | Freq: Once | RESPIRATORY_TRACT | Status: AC
  Administered 2021-09-20: 1 mg via RESPIRATORY_TRACT
  Filled 2021-09-20: qty 5

## 2021-09-20 MED ORDER — IPRATROPIUM-ALBUTEROL 0.5-2.5 (3) MG/3ML IN SOLN: 3.0000 mL | Freq: Once | RESPIRATORY_TRACT | Status: DC

## 2021-09-20 MED ORDER — DEXAMETHASONE SODIUM PHOSPHATE 10 MG/ML IJ SOLN
10.0000 mg | Freq: Once | INTRAMUSCULAR | Status: AC
Start: 2021-09-20 — End: 2021-09-20
  Administered 2021-09-20: 10 mg via INTRAVENOUS
  Filled 2021-09-20: qty 1

## 2021-09-20 MED ORDER — ALBUTEROL SULFATE HFA 108 (90 BASE) MCG/ACT IN AERS
1.0000 | INHALATION_SPRAY | Freq: Once | RESPIRATORY_TRACT | Status: AC
  Administered 2021-09-20: 1 via RESPIRATORY_TRACT
  Filled 2021-09-20: qty 6.7

## 2021-09-20 MED ORDER — SODIUM CHLORIDE 0.9 % IV BOLUS
1000.0000 mL | Freq: Once | INTRAVENOUS | Status: AC
  Administered 2021-09-20: 1000 mL via INTRAVENOUS

## 2021-09-20 MED ORDER — ALBUTEROL SULFATE (2.5 MG/3ML) 0.083% IN NEBU: 2.5000 mg | INHALATION_SOLUTION | Freq: Four times a day (QID) | RESPIRATORY_TRACT | 12 refills | Status: DC | PRN

## 2021-09-20 MED ORDER — PREDNISONE 50 MG PO TABS: ORAL_TABLET | ORAL | 0 refills | Status: DC

## 2021-09-20 MED ORDER — ONDANSETRON HCL 4 MG/2ML IJ SOLN
4.0000 mg | Freq: Once | INTRAMUSCULAR | Status: AC
  Administered 2021-09-20: 4 mg via INTRAVENOUS
  Filled 2021-09-20: qty 2

## 2021-09-20 NOTE — Discharge Instructions (Addendum)
You were seen in the emergency room today for an asthma exacerbation.  We did a chest x-ray that looked good.  We think you are safe to go home.  We have refilled the prescriptions.  Please fill them and take them as prescribed.  Please call the phone number above to schedule an appointment with a primary care doctor.

## 2021-09-20 NOTE — ED Triage Notes (Signed)
Patient reports started having asthma attack last night and out of rescue inhaler.  Increased sob today.  Patient in moderate resp distress in triage.

## 2021-09-20 NOTE — ED Provider Notes (Signed)
[facility] EMERGENCY DEPARTMENT Provider Note   CSN: [id number] Arrival date & time: 09/20/21  1713     History  Chief Complaint  Patient presents with   Asthma    Keith Henry is a 46 y.o. male with past medical history of asthma who presents to the emergency department today with 4-days of shortness of breath.  Patient states that this feels exactly the same as prior asthma exacerbations.  Patient states that he has run out of his maintenance Flovent at all of his asthma medications over the past 2 weeks.  He is not taking any acid medications over this timeframe.  He notes that he has had no recent exposure to triggers which for him include allergies, viral illnesses and cats.  He has had no recent fevers or chills.   Asthma Associated symptoms include shortness of breath. Pertinent negatives include no chest pain and no abdominal pain.       Home Medications Prior to Admission medications   Medication Sig Start Date End Date Taking? Authorizing Provider  albuterol (PROVENTIL) (2.5 MG/3ML) 0.083% nebulizer solution Take 3 mLs (2.5 mg total) by nebulization every 6 (six) hours as needed for wheezing or shortness of breath. 07/09/21   Petrucelli, Samantha R, PA-C  albuterol (VENTOLIN HFA) 108 (90 Base) MCG/ACT inhaler Inhale 1-2 puffs into the lungs every 6 (six) hours as needed for wheezing or shortness of breath. 06/23/21 07/23/21  Janell Quiet, PA-C  cyclobenzaprine (FLEXERIL) 10 MG tablet Take 1 tablet (10 mg total) by mouth 2 (two) times daily as needed for muscle spasms. 08/14/21   Alvira Monday, MD  fluticasone (FLOVENT HFA) 44 MCG/ACT inhaler Inhale 2 puffs into the lungs in the morning and at bedtime. 08/14/21   Redwine, Madison A, PA-C  risperiDONE (RISPERDAL) 1 MG tablet Take 1 tablet (1 mg total) by mouth at bedtime for 14 days. 02/13/21 02/27/21  Elgergawy, Leana Roe, MD      Allergies    Other and Methylprednisolone sodium succ    Review of  Systems   Review of Systems  Constitutional:  Negative for chills and fever.  HENT:  Negative for ear pain and sore throat.   Eyes:  Negative for pain and visual disturbance.  Respiratory:  Positive for shortness of breath. Negative for cough.   Cardiovascular:  Negative for chest pain and palpitations.  Gastrointestinal:  Negative for abdominal pain and vomiting.  Genitourinary:  Negative for dysuria and hematuria.  Musculoskeletal:  Negative for arthralgias and back pain.  Skin:  Negative for color change and rash.  Neurological:  Negative for seizures and syncope.  All other systems reviewed and are negative.   Physical Exam Updated Vital Signs BP 119/71 (BP Location: Right Arm)   Pulse (!) 101   Temp 98.2 F (36.8 C)   Resp (!) 28   Ht 5\' 9"  (1.753 m)   Wt 94.8 kg   SpO2 93%   BMI 30.86 kg/m  Physical Exam Vitals and nursing note reviewed.  Constitutional:      General: He is not in acute distress.    Appearance: He is well-developed.     Comments: Upon entering the exam room the patient is lying in bed awake and alert.  He is somewhat tachypneic but is not in frank respiratory distress.  HENT:     Head: Normocephalic and atraumatic.  Eyes:     Conjunctiva/sclera: Conjunctivae normal.  Cardiovascular:     Rate and Rhythm: Normal rate  and regular rhythm.     Heart sounds: No murmur heard.    Comments: Regular rate and rhythm no murmurs or gallops Pulmonary:     Effort: Pulmonary effort is normal. No respiratory distress.     Breath sounds: Wheezing present.     Comments: Patient expiratory and expiratory wheezes with slightly diminished breath sounds diffusely.  He is tachypneic to approximately 30.  He is speaking in full sentences and saturating well on room air.  He is in mild to moderate respiratory distress Abdominal:     Palpations: Abdomen is soft.     Tenderness: There is no abdominal tenderness.  Musculoskeletal:        General: No swelling.     Cervical  back: Neck supple.  Skin:    General: Skin is warm and dry.     Capillary Refill: Capillary refill takes less than 2 seconds.  Neurological:     Mental Status: He is alert.     Comments: Fully alert and oriented moving all extremity spontaneously grossly neurologically intact  Psychiatric:        Mood and Affect: Mood normal.     ED Results / Procedures / Treatments   Labs (all labs ordered are listed, but only abnormal results are displayed) Labs Reviewed  CBC WITH DIFFERENTIAL/PLATELET  COMPREHENSIVE METABOLIC PANEL    EKG None  Radiology No results found.  Procedures Procedures    Medications Ordered in ED Medications - No data to display  ED Course/ Medical Decision Making/ A&P                           Medical Decision Making Amount and/or Complexity of Data Reviewed Labs: ordered. Radiology: ordered.  Risk Prescription drug management.    Patient presents the emergency department hemodynamically stable, with inspiratory expiratory wheezing and evidence of mild to moderate respiratory distress.  Patient is most likely experiencing a moderate asthma exacerbation.  Will provide continuous albuterol, Decadron intravenously and reassess.  I personally reviewed and interpreted the patient chest x-ray which was grossly unremarkable for acute injury cosmetology.  Patient's blood work is within normal limits.   Additional reassessment events the patient has drastically improved.  He is no longer tachypneic.  He is saturating well on room air.  He states that he feels much better and would like to go home at this time.  Feel that is appropriate.  Will observe for an additional 30 minutes to 1 hour after continuous albuterol with likely plan to discharge thereafter  Additional reassessment reveals the patient is still wishing to go home and is improved from prior evaluation.  Will discharge patient with refill for albuterol nebulization solution as well as 5-day course  of prednisone.  Patient was given resources to follow-up with primary care physician and was instructed to do so soon as possible.  He was instructed to come back to the emergency room if he has worsening shortness of breath moving forward        Final Clinical Impression(s) / ED Diagnoses Final diagnoses:  Moderate persistent asthma with exacerbation    Rx / DC Orders ED Discharge Orders     None         Duard Brady, MD 09/20/21 2138    Charlynne Pander, MD 09/20/21 2145

## 2021-09-21 ENCOUNTER — Telehealth: Payer: Self-pay | Admitting: *Deleted

## 2021-09-21 NOTE — Telephone Encounter (Signed)
Pharmacy called related to Rx: prednisone sig .Marland KitchenMarland KitchenEDCM clarified with EDP  to change Rx to: take 1 tablet daily by mouth for 5 days #5.

## 2021-10-21 ENCOUNTER — Emergency Department (HOSPITAL_COMMUNITY): Payer: Self-pay

## 2021-10-21 ENCOUNTER — Encounter (HOSPITAL_COMMUNITY): Payer: Self-pay

## 2021-10-21 ENCOUNTER — Emergency Department (HOSPITAL_COMMUNITY)
Admission: EM | Admit: 2021-10-21 | Discharge: 2021-10-22 | Discharge status: Against Medical Advice | Payer: Self-pay | Attending: Physician Assistant | Admitting: Physician Assistant

## 2021-10-21 ENCOUNTER — Other Ambulatory Visit: Payer: Self-pay

## 2021-10-21 DIAGNOSIS — R062 Wheezing: Secondary | ICD-10-CM | POA: Insufficient documentation

## 2021-10-21 DIAGNOSIS — Z5321 Procedure and treatment not carried out due to patient leaving prior to being seen by health care provider: Secondary | ICD-10-CM | POA: Insufficient documentation

## 2021-10-21 LAB — CBC WITH DIFFERENTIAL/PLATELET
Abs Immature Granulocytes: 0.04 10*3/uL (ref 0.00–0.07)
Basophils Absolute: 0.1 10*3/uL (ref 0.0–0.1)
Basophils Relative: 2 %
Eosinophils Absolute: 0.7 10*3/uL — ABNORMAL HIGH (ref 0.0–0.5)
Eosinophils Relative: 8 %
HCT: 40.6 % (ref 39.0–52.0)
Hemoglobin: 13.6 g/dL (ref 13.0–17.0)
Immature Granulocytes: 0 %
Lymphocytes Relative: 26 %
Lymphs Abs: 2.4 10*3/uL (ref 0.7–4.0)
MCH: 32.2 pg (ref 26.0–34.0)
MCHC: 33.5 g/dL (ref 30.0–36.0)
MCV: 96.2 fL (ref 80.0–100.0)
Monocytes Absolute: 0.7 10*3/uL (ref 0.1–1.0)
Monocytes Relative: 8 %
Neutro Abs: 5.2 10*3/uL (ref 1.7–7.7)
Neutrophils Relative %: 56 %
Platelets: 337 10*3/uL (ref 150–400)
RBC: 4.22 MIL/uL (ref 4.22–5.81)
RDW: 12.2 % (ref 11.5–15.5)
WBC: 9.2 10*3/uL (ref 4.0–10.5)
nRBC: 0 % (ref 0.0–0.2)

## 2021-10-21 LAB — BASIC METABOLIC PANEL
Anion gap: 5 (ref 5–15)
BUN: 9 mg/dL (ref 6–20)
CO2: 24 mmol/L (ref 22–32)
Calcium: 8.6 mg/dL — ABNORMAL LOW (ref 8.9–10.3)
Chloride: 111 mmol/L (ref 98–111)
Creatinine, Ser: 1.08 mg/dL (ref 0.61–1.24)
GFR, Estimated: >60 mL/min (ref >60)
Glucose, Bld: 99 mg/dL (ref 70–99)
Potassium: 4.2 mmol/L (ref 3.5–5.1)
Sodium: 140 mmol/L (ref 135–145)

## 2021-10-21 MED ORDER — ALBUTEROL SULFATE HFA 108 (90 BASE) MCG/ACT IN AERS
2.0000 | INHALATION_SPRAY | Freq: Once | RESPIRATORY_TRACT | Status: AC
  Administered 2021-10-21: 2 via RESPIRATORY_TRACT
  Filled 2021-10-21: qty 6.7

## 2021-10-21 NOTE — ED Triage Notes (Signed)
OUt of rescue inhalers and having asthma attack with wheezing x 3 days.

## 2021-10-21 NOTE — ED Provider Triage Note (Signed)
Emergency Medicine Provider Triage Evaluation Note  DONTARIOUS SCHAUM , a 46 y.o. male  was evaluated in triage.  Pt complains of wheezing.  History of asthma.  Typically uses nebulizers however has been out. Symptoms over the last few days. No CP.   Review of Systems  Positive: SOB, asthma Negative:   Physical Exam  BP (!) 119/105 (BP Location: Left Arm)   Pulse 92   Temp 98 F (36.7 C) (Oral)   Resp 19   SpO2 94%  Gen:   Awake, no distress   Resp:  Inspiratory and expiratory wheeze. MSK:   Moves extremities without difficulty  Other:    Medical Decision Making  Medically screening exam initiated at 4:35 PM.  Appropriate orders placed.  [name] was informed that the remainder of the evaluation will be completed by another provider, this initial triage assessment does not replace that evaluation, and the importance of remaining in the ED until their evaluation is complete.  SOB, asthma   Charita Lindenberger A, PA-C 10/21/21 1636

## 2021-10-22 NOTE — ED Notes (Signed)
X2 no response and not visible in the lobby 

## 2021-10-31 ENCOUNTER — Other Ambulatory Visit: Payer: Self-pay

## 2021-10-31 ENCOUNTER — Emergency Department (HOSPITAL_COMMUNITY): Payer: Commercial Managed Care - HMO

## 2021-10-31 ENCOUNTER — Encounter (HOSPITAL_COMMUNITY): Payer: Self-pay

## 2021-10-31 ENCOUNTER — Emergency Department (HOSPITAL_COMMUNITY)
Admission: EM | Admit: 2021-10-31 | Discharge: 2021-10-31 | Disposition: A | Discharge status: Home / Self Care | Payer: Commercial Managed Care - HMO | Attending: Emergency Medicine | Admitting: Emergency Medicine

## 2021-10-31 ENCOUNTER — Emergency Department (HOSPITAL_COMMUNITY)
Admission: EM | Admit: 2021-10-31 | Discharge: 2021-11-01 | Disposition: A | Discharge status: Home / Self Care | Payer: Commercial Managed Care - HMO | Source: Home / Self Care | Attending: Emergency Medicine | Admitting: Emergency Medicine

## 2021-10-31 DIAGNOSIS — M25531 Pain in right wrist: Secondary | ICD-10-CM | POA: Insufficient documentation

## 2021-10-31 DIAGNOSIS — J4530 Mild persistent asthma, uncomplicated: Secondary | ICD-10-CM | POA: Insufficient documentation

## 2021-10-31 DIAGNOSIS — Z7951 Long term (current) use of inhaled steroids: Secondary | ICD-10-CM | POA: Insufficient documentation

## 2021-10-31 DIAGNOSIS — R0602 Shortness of breath: Secondary | ICD-10-CM | POA: Diagnosis present

## 2021-10-31 MED ORDER — IPRATROPIUM-ALBUTEROL 0.5-2.5 (3) MG/3ML IN SOLN: 3.0000 mL | Freq: Once | RESPIRATORY_TRACT | Status: DC

## 2021-10-31 MED ORDER — IPRATROPIUM-ALBUTEROL 0.5-2.5 (3) MG/3ML IN SOLN
3.0000 mL | Freq: Once | RESPIRATORY_TRACT | Status: AC
  Administered 2021-10-31: 3 mL via RESPIRATORY_TRACT
  Filled 2021-10-31: qty 3

## 2021-10-31 MED ORDER — ALBUTEROL SULFATE HFA 108 (90 BASE) MCG/ACT IN AERS
2.0000 | INHALATION_SPRAY | RESPIRATORY_TRACT | Status: DC | PRN
  Administered 2021-10-31: 2 via RESPIRATORY_TRACT
  Filled 2021-10-31: qty 6.7

## 2021-10-31 NOTE — ED Notes (Signed)
Registration states this person is leaving d/t wait time
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
## 2021-10-31 NOTE — ED Triage Notes (Signed)
Patent states he feels like he can't breath. Trouble breathing started 14 days ago. Patient was unable to get his medication. Patient is out of is albuterol. Patient also complains of right arm pain.

## 2021-11-01 MED ORDER — ALBUTEROL SULFATE HFA 108 (90 BASE) MCG/ACT IN AERS
2.0000 | INHALATION_SPRAY | RESPIRATORY_TRACT | 3 refills | Status: DC | PRN
Start: 2021-11-01 — End: 2021-11-12

## 2021-11-01 MED ORDER — ALBUTEROL SULFATE HFA 108 (90 BASE) MCG/ACT IN AERS: 2.0000 | INHALATION_SPRAY | RESPIRATORY_TRACT | 3 refills | Status: DC | PRN

## 2021-11-01 NOTE — Discharge Instructions (Addendum)
I have prescribed albuterol inhaler at [facility]  in [place] at a discount price please pick it up with the attached coupon. Please wear your spica splint for your wrist pain. Take Tylenol/ibuprofen for pain.

## 2021-11-01 NOTE — ED Notes (Signed)
Pt ambulated with steady gait from emergency.
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
## 2021-11-12 ENCOUNTER — Encounter (HOSPITAL_COMMUNITY): Payer: Self-pay | Admitting: Emergency Medicine

## 2021-11-12 ENCOUNTER — Emergency Department (HOSPITAL_COMMUNITY): Payer: Commercial Managed Care - HMO

## 2021-11-12 ENCOUNTER — Observation Stay (HOSPITAL_COMMUNITY)
Admission: EM | Admit: 2021-11-12 | Discharge: 2021-11-14 | Disposition: A | Discharge status: Home / Self Care | Payer: Commercial Managed Care - HMO | Attending: Internal Medicine | Admitting: Internal Medicine

## 2021-11-12 ENCOUNTER — Other Ambulatory Visit: Payer: Self-pay

## 2021-11-12 DIAGNOSIS — S63501A Unspecified sprain of right wrist, initial encounter: Secondary | ICD-10-CM | POA: Diagnosis present

## 2021-11-12 DIAGNOSIS — Z87891 Personal history of nicotine dependence: Secondary | ICD-10-CM | POA: Diagnosis not present

## 2021-11-12 DIAGNOSIS — J45901 Unspecified asthma with (acute) exacerbation: Secondary | ICD-10-CM | POA: Diagnosis not present

## 2021-11-12 DIAGNOSIS — Z20822 Contact with and (suspected) exposure to covid-19: Secondary | ICD-10-CM | POA: Insufficient documentation

## 2021-11-12 DIAGNOSIS — Z79899 Other long term (current) drug therapy: Secondary | ICD-10-CM | POA: Insufficient documentation

## 2021-11-12 DIAGNOSIS — E441 Mild protein-calorie malnutrition: Secondary | ICD-10-CM | POA: Diagnosis not present

## 2021-11-12 DIAGNOSIS — J4541 Moderate persistent asthma with (acute) exacerbation: Secondary | ICD-10-CM | POA: Diagnosis not present

## 2021-11-12 DIAGNOSIS — Z720 Tobacco use: Secondary | ICD-10-CM | POA: Diagnosis present

## 2021-11-12 DIAGNOSIS — J4530 Mild persistent asthma, uncomplicated: Secondary | ICD-10-CM

## 2021-11-12 DIAGNOSIS — R0602 Shortness of breath: Secondary | ICD-10-CM | POA: Diagnosis present

## 2021-11-12 LAB — COMPREHENSIVE METABOLIC PANEL
ALT: 13 U/L (ref 0–44)
AST: 33 U/L (ref 15–41)
Albumin: 3.3 g/dL — ABNORMAL LOW (ref 3.5–5.0)
Alkaline Phosphatase: 83 U/L (ref 38–126)
Anion gap: 6 (ref 5–15)
BUN: 10 mg/dL (ref 6–20)
CO2: 23 mmol/L (ref 22–32)
Calcium: 8.2 mg/dL — ABNORMAL LOW (ref 8.9–10.3)
Chloride: 109 mmol/L (ref 98–111)
Creatinine, Ser: 1.05 mg/dL (ref 0.61–1.24)
GFR, Estimated: >60 mL/min (ref >60)
Glucose, Bld: 109 mg/dL — ABNORMAL HIGH (ref 70–99)
Potassium: 5 mmol/L (ref 3.5–5.1)
Sodium: 138 mmol/L (ref 135–145)
Total Bilirubin: 1.4 mg/dL — ABNORMAL HIGH (ref 0.3–1.2)
Total Protein: 6.2 g/dL — ABNORMAL LOW (ref 6.5–8.1)

## 2021-11-12 LAB — CBC
HCT: 40.9 % (ref 39.0–52.0)
Hemoglobin: 13.7 g/dL (ref 13.0–17.0)
MCH: 32.7 pg (ref 26.0–34.0)
MCHC: 33.5 g/dL (ref 30.0–36.0)
MCV: 97.6 fL (ref 80.0–100.0)
Platelets: 305 10*3/uL (ref 150–400)
RBC: 4.19 MIL/uL — ABNORMAL LOW (ref 4.22–5.81)
RDW: 12.6 % (ref 11.5–15.5)
WBC: 8.6 10*3/uL (ref 4.0–10.5)
nRBC: 0 % (ref 0.0–0.2)

## 2021-11-12 LAB — BLOOD GAS, VENOUS
Acid-Base Excess: 0.8 mmol/L (ref 0.0–2.0)
Bicarbonate: 26 mmol/L (ref 20.0–28.0)
O2 Saturation: 93.5 %
Patient temperature: 37
pCO2, Ven: 43 mmHg — ABNORMAL LOW (ref 44–60)
pH, Ven: 7.39 (ref 7.25–7.43)
pO2, Ven: 67 mmHg — ABNORMAL HIGH (ref 32–45)

## 2021-11-12 LAB — RESP PANEL BY RT-PCR (FLU A&B, COVID) ARPGX2
Influenza A by PCR: NEGATIVE
Influenza B by PCR: NEGATIVE
SARS Coronavirus 2 by RT PCR: NEGATIVE

## 2021-11-12 LAB — MAGNESIUM: Magnesium: 1.8 mg/dL (ref 1.7–2.4)

## 2021-11-12 MED ORDER — ACETAMINOPHEN 650 MG RE SUPP: 650.0000 mg | Freq: Four times a day (QID) | RECTAL | Status: DC | PRN

## 2021-11-12 MED ORDER — IPRATROPIUM BROMIDE 0.02 % IN SOLN
0.5000 mg | Freq: Once | RESPIRATORY_TRACT | Status: AC
  Administered 2021-11-12: 0.5 mg via RESPIRATORY_TRACT
  Filled 2021-11-12: qty 2.5

## 2021-11-12 MED ORDER — KETOROLAC TROMETHAMINE 30 MG/ML IJ SOLN
30.0000 mg | Freq: Once | INTRAMUSCULAR | Status: AC
  Administered 2021-11-12: 30 mg via INTRAVENOUS
  Filled 2021-11-12: qty 1

## 2021-11-12 MED ORDER — MAGNESIUM SULFATE 2 GM/50ML IV SOLN
2.0000 g | Freq: Once | INTRAVENOUS | Status: AC
  Administered 2021-11-12: 2 g via INTRAVENOUS
  Filled 2021-11-12: qty 50

## 2021-11-12 MED ORDER — IPRATROPIUM-ALBUTEROL 0.5-2.5 (3) MG/3ML IN SOLN
3.0000 mL | Freq: Four times a day (QID) | RESPIRATORY_TRACT | Status: DC
  Administered 2021-11-12 – 2021-11-13 (×6): 3 mL via RESPIRATORY_TRACT
  Filled 2021-11-12 (×6): qty 3

## 2021-11-12 MED ORDER — ACETAMINOPHEN 325 MG PO TABS
650.0000 mg | ORAL_TABLET | Freq: Four times a day (QID) | ORAL | Status: DC | PRN
  Administered 2021-11-12 – 2021-11-13 (×4): 650 mg via ORAL
  Filled 2021-11-12 (×4): qty 2

## 2021-11-12 MED ORDER — ACETAMINOPHEN 500 MG PO TABS
1000.0000 mg | ORAL_TABLET | Freq: Once | ORAL | Status: AC
  Administered 2021-11-12: 1000 mg via ORAL
  Filled 2021-11-12: qty 2

## 2021-11-12 MED ORDER — PREDNISONE 20 MG PO TABS
40.0000 mg | ORAL_TABLET | Freq: Every day | ORAL | Status: DC
  Administered 2021-11-12 – 2021-11-14 (×3): 40 mg via ORAL
  Filled 2021-11-12 (×4): qty 2

## 2021-11-12 MED ORDER — ONDANSETRON HCL 4 MG/2ML IJ SOLN
4.0000 mg | Freq: Once | INTRAMUSCULAR | Status: AC
  Administered 2021-11-12: 4 mg via INTRAVENOUS
  Filled 2021-11-12: qty 2

## 2021-11-12 MED ORDER — ORAL CARE MOUTH RINSE: 15.0000 mL | OROMUCOSAL | Status: DC | PRN

## 2021-11-12 MED ORDER — PNEUMOCOCCAL 20-VAL CONJ VACC 0.5 ML IM SUSY
0.5000 mL | PREFILLED_SYRINGE | INTRAMUSCULAR | Status: DC
  Filled 2021-11-12: qty 0.5

## 2021-11-12 MED ORDER — HYDROCODONE BIT-HOMATROP MBR 5-1.5 MG/5ML PO SOLN
5.0000 mL | Freq: Four times a day (QID) | ORAL | Status: DC | PRN
  Administered 2021-11-12 – 2021-11-13 (×4): 5 mL via ORAL
  Filled 2021-11-12 (×5): qty 5

## 2021-11-12 MED ORDER — INFLUENZA VAC SPLIT QUAD 0.5 ML IM SUSY: 0.5000 mL | PREFILLED_SYRINGE | INTRAMUSCULAR | Status: DC

## 2021-11-12 MED ORDER — METHYLPREDNISOLONE SODIUM SUCC 125 MG IJ SOLR: 80.0000 mg | Freq: Two times a day (BID) | INTRAMUSCULAR | Status: DC

## 2021-11-12 MED ORDER — ALBUTEROL SULFATE (2.5 MG/3ML) 0.083% IN NEBU
2.5000 mg | INHALATION_SOLUTION | RESPIRATORY_TRACT | Status: DC | PRN
  Administered 2021-11-14: 2.5 mg via RESPIRATORY_TRACT
  Filled 2021-11-12: qty 3

## 2021-11-12 MED ORDER — ALBUTEROL SULFATE (2.5 MG/3ML) 0.083% IN NEBU
10.0000 mg/h | INHALATION_SOLUTION | RESPIRATORY_TRACT | Status: DC
  Administered 2021-11-12: 10 mg/h via RESPIRATORY_TRACT
  Filled 2021-11-12: qty 12

## 2021-11-12 MED ORDER — METHYLPREDNISOLONE SODIUM SUCC 125 MG IJ SOLR
125.0000 mg | Freq: Once | INTRAMUSCULAR | Status: AC
  Administered 2021-11-12: 125 mg via INTRAVENOUS
  Filled 2021-11-12: qty 2

## 2021-11-12 MED ORDER — SODIUM CHLORIDE 0.9 % IV BOLUS
1000.0000 mL | Freq: Once | INTRAVENOUS | Status: AC
  Administered 2021-11-12: 1000 mL via INTRAVENOUS

## 2021-11-12 NOTE — TOC Initial Note (Signed)
Transition of Care [facility]) - Initial/Assessment Note    Patient Details  Name: Keith Henry MRN: [id number] Date of Birth: [birth year]  [facility]) CM/SW Contact:    Dessa Phi, RN Phone Number: 11/12/2021, 3:10 PM  Clinical Narrative:Monitor for d/c plans. Will discus w/patient he has health insurance w/co pay-he can contact pharmacy for mail order delivery.                   Expected Discharge Plan: Home/Self Care Barriers to Discharge: Continued Medical Work up   Patient Goals and CMS Choice Patient states their goals for this hospitalization and ongoing recovery are::  (Home) CMS Medicare.gov Compare Post Acute Care list provided to:: Patient Choice offered to / list presented to : NA  Expected Discharge Plan and Services Expected Discharge Plan: Home/Self Care   Discharge Planning Services: CM Consult   Living arrangements for the past 2 months: Single Family Home                                      Prior Living Arrangements/Services Living arrangements for the past 2 months: Single Family Home Lives with:: Spouse Patient language and need for interpreter reviewed:: No Do you feel safe going back to the place where you live?: Yes        Care giver support system in place?: Yes (comment)   Criminal Activity/Legal Involvement Pertinent to Current Situation/Hospitalization: No - Comment as needed  Activities of Daily Living Home Assistive Devices/Equipment: Brace (specify type), Nebulizer (right wrist brace, inhalers) ADL Screening (condition at time of admission) Patient's cognitive ability adequate to safely complete daily activities?: Yes Is the patient deaf or have difficulty hearing?: No Does the patient have difficulty seeing, even when wearing glasses/contacts?: No Does the patient have difficulty concentrating, remembering, or making decisions?: Yes Patient able to express need for assistance with ADLs?: Yes Does the patient have  difficulty dressing or bathing?: No Independently performs ADLs?: Yes (appropriate for developmental age) Does the patient have difficulty walking or climbing stairs?: No Weakness of Legs: None Weakness of Arms/Hands: Right  Permission Sought/Granted Permission sought to share information with : Case Manager Permission granted to share information with : Yes, Verbal Permission Granted  Share Information with NAME:  (Case Manager)           Emotional Assessment Appearance:: Appears stated age   Affect (typically observed): Accepting Orientation: : Oriented to Self, Oriented to Place, Oriented to  Time, Oriented to Situation Alcohol / Substance Use: Not Applicable Psych Involvement: No (comment)  Admission diagnosis:  Moderate persistent asthma with exacerbation [J45.41] Acute asthma exacerbation [J45.901] Patient Active Problem List   Diagnosis Date Noted   Acute asthma exacerbation 11/12/2021   Hyperbilirubinemia 11/12/2021   Mild protein malnutrition ([place]) 11/12/2021   Right wrist sprain 11/12/2021   Tobacco use 11/12/2021   SIRS (systemic inflammatory response syndrome) ([place]) 02/08/2021   Asthma exacerbation 02/08/2021   Back strain 11/24/2018   PCP:  Pcp, No Pharmacy:   [id number] [address], [address] AT [facility] [address] [place] [address] Phone: [phone] Fax: [phone]  [facility] [phone] - [place], [address] AT [address] [place] [address] Phone: [phone] Fax: [place]  [address] [place] [address] Phone: [phone] Fax: [phone]  [facility] [address] [place] [address] Phone: [phone] Fax: [phone]  [facility] [address] [place] [address] Phone:  [phone] Fax: [phone]  [id number] [place], [place] - [address] AT [facility] [address] [place] [address] Phone: [phone] Fax: [phone]     Social Determinants of Health (SDOH) Interventions Housing Interventions: Inpatient TOC  Readmission Risk Interventions    02/13/2021   12:04 PM  Readmission Risk Prevention Plan  Transportation Screening Complete  PCP or Specialist Appt within 5-7 Days Not Complete  Not Complete comments soonest apt 03/20/21  Home Care Screening Not Completed Comments patient is homeless. stays at friends when he can  Medication Review (RN CM) Complete

## 2021-11-12 NOTE — ED Provider Notes (Signed)
[facility]-EMERGENCY DEPT Provider Note  CSN: [id number] Arrival date & time: 11/12/21 0130  Chief Complaint(s) Shortness of Breath  HPI Keith Henry is a 46 y.o. male with a past medical history of asthma who presents to the emergency department with several weeks of intermittent shortness of breath, wheezing and cough.  At times it is improved with home breathing treatments however today he was unable to get relief with his home nebulizers prompting a call to EMS.  Patient had diffuse wheezing throughout and was tachypneic.  EMS reports that he was satting well on room air.  Cough is nonproductive.  No associated chest pain.  No nausea or vomiting.  No other physical complaints.  The history is provided by the patient.    Past Medical History Past Medical History:  Diagnosis Date   Asthma    Patient Active Problem List   Diagnosis Date Noted   Acute asthma exacerbation 11/12/2021   SIRS (systemic inflammatory response syndrome) (H[facility]) 02/08/2021   Asthma exacerbation 02/08/2021   Home Medication(s) Prior to Admission medications   Medication Sig Start Date End Date Taking? Authorizing Provider  albuterol (PROVENTIL) (2.5 MG/3ML) 0.083% nebulizer solution Take 3 mLs (2.5 mg total) by nebulization every 6 (six) hours as needed for wheezing or shortness of breath. 09/20/21  Yes Duard Brady, MD  albuterol (VENTOLIN HFA) 108 (90 Base) MCG/ACT inhaler Inhale 1-2 puffs into the lungs every 6 (six) hours as needed for wheezing or shortness of breath. 06/23/21 11/12/21 Yes Raynald Blend R, PA-C  cyclobenzaprine (FLEXERIL) 10 MG tablet Take 1 tablet (10 mg total) by mouth 2 (two) times daily as needed for muscle spasms. Patient not taking: Reported on 11/12/2021 08/14/21   Alvira Monday, MD  fluticasone (FLOVENT HFA) 44 MCG/ACT inhaler Inhale 2 puffs into the lungs in the morning and at bedtime. Patient not taking: Reported on 11/12/2021 09/20/21 10/20/21  Duard Brady, MD  predniSONE (DELTASONE) 50 MG tablet N/a Patient not taking: Reported on 11/12/2021 09/20/21   Duard Brady, MD  risperiDONE (RISPERDAL) 1 MG tablet Take 1 tablet (1 mg total) by mouth at bedtime for 14 days. Patient not taking: Reported on 11/12/2021 02/13/21 02/27/21  Elgergawy, Leana Roe, MD                                                                                                                                    Allergies Other and Methylprednisolone sodium succ  Review of Systems Review of Systems As noted in HPI  Physical Exam Vital Signs  I have reviewed the triage vital signs BP (!) 159/101   Pulse 94   Temp 99.2 F (37.3 C)   Resp (!) 30   SpO2 95%   Physical Exam Vitals reviewed.  Constitutional:      General: He is not in acute distress.    Appearance: He is well-developed. He is not diaphoretic.  HENT:  Head: Normocephalic and atraumatic.     Nose: Nose normal.  Eyes:     General: No scleral icterus.       Right eye: No discharge.        Left eye: No discharge.     Conjunctiva/sclera: Conjunctivae normal.     Pupils: Pupils are equal, round, and reactive to light.  Cardiovascular:     Rate and Rhythm: Normal rate and regular rhythm.     Heart sounds: No murmur heard.    No friction rub. No gallop.  Pulmonary:     Effort: Pulmonary effort is normal. Tachypnea present. No respiratory distress.     Breath sounds: No stridor. Examination of the right-upper field reveals wheezing. Examination of the left-upper field reveals wheezing. Examination of the right-middle field reveals wheezing. Examination of the left-middle field reveals wheezing. Examination of the right-lower field reveals wheezing. Examination of the left-lower field reveals wheezing. Wheezing present. No rales.  Abdominal:     General: There is no distension.     Palpations: Abdomen is soft.     Tenderness: There is no abdominal tenderness.  Musculoskeletal:        General: No  tenderness.     Cervical back: Normal range of motion and neck supple.  Skin:    General: Skin is warm and dry.     Findings: No erythema or rash.  Neurological:     Mental Status: He is alert and oriented to person, place, and time.     ED Results and Treatments Labs (all labs ordered are listed, but only abnormal results are displayed) Labs Reviewed  COMPREHENSIVE METABOLIC PANEL - Abnormal; Notable for the following components:      Result Value   Glucose, Bld 109 (*)    Calcium 8.2 (*)    Total Protein 6.2 (*)    Albumin 3.3 (*)    Total Bilirubin 1.4 (*)    All other components within normal limits  CBC - Abnormal; Notable for the following components:   RBC 4.19 (*)    All other components within normal limits  BLOOD GAS, VENOUS - Abnormal; Notable for the following components:   pCO2, Ven 43 (*)    pO2, Ven 67 (*)    All other components within normal limits  RESP PANEL BY RT-PCR (FLU A&B, COVID) [id number]                                                                                                                         EKG  EKG Interpretation  Date/Time:  Tuesday November 12 2021 01:40:59 EDT Ventricular Rate:  79 PR Interval:  147 QRS Duration: 117 QT Interval:  373 QTC Calculation: 428 R Axis:   52 Text Interpretation: Sinus rhythm Nonspecific intraventricular conduction delay No acute changes Confirmed by Drema Pry [id number]) on 11/12/2021 2:45:22 AM       Radiology DG Chest Port 1 View  Result Date: 11/12/2021 CLINICAL DATA:  Dyspnea, asthma EXAM:  PORTABLE CHEST 1 VIEW COMPARISON:  10/31/2021 FINDINGS: Lungs are well expanded, symmetric, and clear. No pneumothorax or pleural effusion. Cardiac size within normal limits. Pulmonary vascularity is normal. Osseous structures are age-appropriate. Healed right clavicle fracture again noted. No acute bone abnormality. IMPRESSION: No active disease. Electronically Signed   By: Fidela Salisbury M.D.   On:  11/12/2021 02:07    Medications Ordered in ED Medications  albuterol (PROVENTIL) (2.5 MG/3ML) 0.083% nebulizer solution (10 mg/hr Nebulization New Bag/Given 11/12/21 0400)  methylPREDNISolone sodium succinate (SOLU-MEDROL) 125 mg/2 mL injection 125 mg (125 mg Intravenous Given 11/12/21 0200)  ondansetron (ZOFRAN) injection 4 mg (4 mg Intravenous Given 11/12/21 0200)  sodium chloride 0.9 % bolus 1,000 mL (0 mLs Intravenous Stopped 11/12/21 0315)  magnesium sulfate IVPB 2 g 50 mL (0 g Intravenous Stopped 11/12/21 0316)  ipratropium (ATROVENT) nebulizer solution 0.5 mg (0.5 mg Nebulization Given 11/12/21 0400)  acetaminophen (TYLENOL) tablet 1,000 mg (1,000 mg Oral Given 11/12/21 3818)                                                                                                                                     Procedures .1-3 Lead EKG Interpretation  Performed by: Fatima Blank, MD Authorized by: Fatima Blank, MD     Interpretation: normal     ECG rate:  93   ECG rate assessment: normal     Rhythm: sinus rhythm     Ectopy: none     Conduction: normal   .Critical Care  Performed by: Fatima Blank, MD Authorized by: Fatima Blank, MD   Critical care provider statement:    Critical care time (minutes):  45   Critical care time was exclusive of:  Separately billable procedures and treating other patients   Critical care was necessary to treat or prevent imminent or life-threatening deterioration of the following conditions:  Respiratory failure   Critical care was time spent personally by me on the following activities:  Development of treatment plan with patient or surrogate, discussions with consultants, evaluation of patient's response to treatment, examination of patient, obtaining history from patient or surrogate, review of old charts, re-evaluation of patient's condition, pulse oximetry, ordering and review of radiographic studies, ordering  and review of laboratory studies and ordering and performing treatments and interventions   Care discussed with: admitting provider     (including critical care time)  Medical Decision Making / ED Course   Medical Decision Making Amount and/or Complexity of Data Reviewed Labs: ordered. Decision-making details documented in ED Course. Radiology: ordered and independent interpretation performed. Decision-making details documented in ED Course. ECG/medicine tests: ordered and independent interpretation performed. Decision-making details documented in ED Course.  Risk OTC drugs. Prescription drug management. Drug therapy requiring intensive monitoring for toxicity. Decision regarding hospitalization.   Patient presents with shortness of breath and diffuse wheezing. Most suspicious for asthma exacerbation. We will rule out pneumonia,  pneumothorax.  Low concern for pulmonary embolism. No evidence of volume overload concerning for heart failure.  Patient given IV Solu-Medrol and magnesium as well as continuous DuoNeb.  CBC without leukocytosis or anemia. Metabolic panel without significant electrolyte derangement or renal insufficiency. VBG without evidence of hypercarbia. Chest x-ray without evidence of pneumonia, pneumothorax, pulmonary edema.  On reassessment patient reports improvement shortness of breath but still wheezing.  Now states that he is having myalgias. Temperature now up to 99.7. We will obtain a COVID/influenza.  Patient will be admitted for continued management of his wheezing given his need for high-dose albuterol.  Spoke with Dr. Arlean Hopping from hospitalist service who agreed to admit patient for further management.        Final Clinical Impression(s) / ED Diagnoses Final diagnoses:  Moderate persistent asthma with exacerbation           This chart was dictated using voice recognition software.  Despite best efforts to proofread,  errors can occur  which can change the documentation meaning.    Nira Conn, MD 11/12/21 [phone]

## 2021-11-12 NOTE — Progress Notes (Signed)
  Carryover admission to the Day Admitter.  I discussed this case with the EDP, Dr. Leonette Monarch.  Per these discussions:   This is a 46 year old male with a reported history of asthma, who is being admitted with acute asthma exacerbation in setting of 1 to 2 days of shortness of breath associated with wheezing, nonproductive cough, and generalized myalgias.  He received 3 nebulizer treatments this evening before arriving in the ED, with significant wheezing noted on exam.   Presentation not associated with any hypoxia.  Chest x-ray reportedly showed no acute process.  In the ED he has received solumedrol 125 mg IV x1, 2 g of IV magnesium, and is in the process of receiving an hour-long albuterol nebulizer.  Shortness of breath is reportedly improving but not back to baseline, continues to exhibit evidence of wheezing and increased work of breathing.   COVID-19 PCR result currently pending.   I have placed an order for inpatient admission to pcu for further evaluation and management of acute asthma exacerbation.  I have placed some additional preliminary admit orders via the adult multi-morbid admission order set. I have also ordered additional solumedrol, scheduled duo nebulizer treatments, prn albuterol nebulizer, and ordered a serum magnesium level.    Babs Bertin, DO Hospitalist

## 2021-11-12 NOTE — Progress Notes (Signed)
Pt complained of chest pain due to persistent cough. Tylenol 650 mg/tab and Hycodan 5 ml syrup given as PRN. BP of 157/102 and not in distress. Will monitor patient.

## 2021-11-12 NOTE — ED Triage Notes (Signed)
Pt comes from home w/ [facility]. C/o [facility] & asthma. Wheezes noted bilaterally.  10 albuterol &  0.5 atrovent given en route. VSS,.

## 2021-11-12 NOTE — H&P (Signed)
History and Physical    Patient: Keith Henry DDU:[id number] DOB: 24-Dec-1975 DOA: 11/12/2021 DOS: the patient was seen and examined on 11/12/2021 PCP: Pcp, No  Patient coming from: Home  Chief Complaint:  Chief Complaint  Patient presents with   Shortness of Breath   HPI: Keith Henry is a 46 y.o. male with medical history significant of asthma, tobacco abuse who is coming to the ED due to several weeks of on and off dyspnea associated with wheezing and whitish sputum productive cough that was only minimally relieved with his home nebulized beta agonist yesterday evening so he had to call EMS.  He has lower chest wall and upper abdominal pain when coughing.  No sick contacts or travel history.  The patient stated he quit smoking about 19 days ago.  He also stated that is not unusual to have his asthma exacerbated when the weather changes during the fall. He denied fever, chills, rhinorrhea, sore throat or hemoptysis.  No typical chest pain, palpitations, diaphoresis, PND, orthopnea or pitting edema of the lower extremities.  No abdominal pain, nausea, emesis, diarrhea, constipation, melena or hematochezia.  No flank pain, dysuria, frequency or hematuria.  No polyuria, polydipsia, polyphagia or blurred vision.   ED course: Initial vital signs were temperature 98.2 F, pulse 79, respirations 24, BP 147/93 mmHg O2 sat 100% on room air.  Patient received 1000 mL of normal saline bolus, acetaminophen 1000 mg p.o. x1 for right wrist sprain, albuterol 10+ ipratropium 0.5 mg continuous neb, magnesium sulfate 2 g IVPB, methylprednisolone 125 mg IVPB and ondansetron 4 mg IVP.  Lab work: His CBC showed a white count of 8.6, hemoglobin 13.7 g/dL platelets 237.  Venous blood gas with normal pH, PCO2 of 43 and PO2 of 67 mmHg.  Normal bicarbonate and acid-base excess.  Coronavirus influenza PCR was negative.  Magnesium 1.9 mg/dL.  CMP showed a glucose of 101 and total bilirubin 1.4 mg/deciliter.  Total  protein 6.2 and albumin 3.3 g/dL.  The rest of the CMP measurements were normal after calcium correction.  Imaging: Portable 1 view chest radiograph with no active disease.   Review of Systems: As mentioned in the history of present illness. All other systems reviewed and are negative. Past Medical History:  Diagnosis Date   Asthma    Past Surgical History:  Procedure Laterality Date   ANKLE SURGERY     SKIN GRAFT     SKULL FRACTURE ELEVATION     pt unsure of specifics   Social History:  reports that he quit smoking about 4 months ago. His smoking use included cigarettes. He smoked an average of 2 packs per day. He has never used smokeless tobacco. He reports current alcohol use of about 2.0 standard drinks of alcohol per week. He reports current drug use. Drug: Marijuana.  Allergies  Allergen Reactions   Other Anaphylaxis    Pork   Methylprednisolone Sodium Succ Nausea And Vomiting    History reviewed. No pertinent family history.  Prior to Admission medications   Medication Sig Start Date End Date Taking? Authorizing Provider  albuterol (PROVENTIL) (2.5 MG/3ML) 0.083% nebulizer solution Take 3 mLs (2.5 mg total) by nebulization every 6 (six) hours as needed for wheezing or shortness of breath. 09/20/21  Yes Duard Brady, MD  albuterol (VENTOLIN HFA) 108 (90 Base) MCG/ACT inhaler Inhale 1-2 puffs into the lungs every 6 (six) hours as needed for wheezing or shortness of breath. 06/23/21 11/12/21 Yes Raynald Blend R, PA-C  cyclobenzaprine (FLEXERIL)  10 MG tablet Take 1 tablet (10 mg total) by mouth 2 (two) times daily as needed for muscle spasms. Patient not taking: Reported on 11/12/2021 08/14/21   Gareth Morgan, MD  fluticasone (FLOVENT HFA) 44 MCG/ACT inhaler Inhale 2 puffs into the lungs in the morning and at bedtime. Patient not taking: Reported on 11/12/2021 09/20/21 10/20/21  Levie Heritage, MD  predniSONE (DELTASONE) 50 MG tablet N/a Patient not taking: Reported on  11/12/2021 09/20/21   Levie Heritage, MD  risperiDONE (RISPERDAL) 1 MG tablet Take 1 tablet (1 mg total) by mouth at bedtime for 14 days. Patient not taking: Reported on 11/12/2021 02/13/21 02/27/21  Elgergawy, Silver Huguenin, MD    Physical Exam: Vitals:   11/12/21 0430 11/12/21 0500 11/12/21 0530 11/12/21 0600  BP:  (!) 143/95 (!) 150/88 (!) 159/101  Pulse: 84 93 95 94  Resp: (!) 22 (!) 26 (!) 30   Temp:  99.2 F (37.3 C)    TempSrc:      SpO2: 100% 99% 100% 95%   Physical Exam Constitutional:      General: He is awake.  HENT:     Head: Normocephalic.     Nose: No rhinorrhea.     Mouth/Throat:     Mouth: Mucous membranes are moist.     Pharynx: No posterior oropharyngeal erythema.  Eyes:     General: No scleral icterus.    Pupils: Pupils are equal, round, and reactive to light.  Neck:     Vascular: No JVD.  Cardiovascular:     Rate and Rhythm: Normal rate and regular rhythm.     Heart sounds: S1 normal and S2 normal.  Pulmonary:     Effort: Pulmonary effort is normal.     Breath sounds: Decreased breath sounds, wheezing and rhonchi present. No rales.  Abdominal:     General: Bowel sounds are normal. There is no distension.     Palpations: Abdomen is soft.     Tenderness: There is no abdominal tenderness. There is no right CVA tenderness or left CVA tenderness.  Musculoskeletal:     Cervical back: Neck supple.     Right lower leg: No edema.     Left lower leg: No edema.  Skin:    General: Skin is warm and dry.  Neurological:     General: No focal deficit present.     Mental Status: He is alert and oriented to person, place, and time.  Psychiatric:        Mood and Affect: Mood normal.        Behavior: Behavior normal. Behavior is cooperative.   Data Reviewed:  There are no new results to review at this time.  Assessment and Plan: Principal Problem:   Acute asthma exacerbation Observation/telemetry Continue supplemental oxygen. Methylprednisolone 125 mg IVP  x1. Followed by prednisone 40 mg p.o. daily in a.m. Scheduled and as needed bronchodilators. Follow-up CBC and chemistry in the morning.   Active Problems:   Hyperbilirubinemia Recheck bilirubin level in the morning.    Mild protein malnutrition (HCC) Protein supplementation.    Right wrist sprain Acetaminophen as needed. May use Hycodan for pain as well.    Tobacco use No need for daily replacement. The patient is committed to cease smoking.    Advance Care Planning:   Code Status: Full Code   Consults:   Family Communication:   Severity of Illness: The appropriate patient status for this patient is INPATIENT. Inpatient status is judged to be reasonable and necessary  in order to provide the required intensity of service to ensure the patient's safety. The patient's presenting symptoms, physical exam findings, and initial radiographic and laboratory data in the context of their chronic comorbidities is felt to place them at high risk for further clinical deterioration. Furthermore, it is not anticipated that the patient will be medically stable for discharge from the hospital within 2 midnights of admission.   * I certify that at the point of admission it is my clinical judgment that the patient will require inpatient hospital care spanning beyond 2 midnights from the point of admission due to high intensity of service, high risk for further deterioration and high frequency of surveillance required.*  Author: Bobette Mo, MD 11/12/2021 7:24 AM  For on call review [email or web address].   This document was prepared using Dragon voice recognition software and may contain some unintended transcription errors.

## 2021-11-13 DIAGNOSIS — J45901 Unspecified asthma with (acute) exacerbation: Secondary | ICD-10-CM | POA: Diagnosis not present

## 2021-11-13 LAB — RESPIRATORY PANEL BY PCR

## 2021-11-13 LAB — PROCALCITONIN: Procalcitonin: <0.1 ng/mL

## 2021-11-13 MED ORDER — MELATONIN 3 MG PO TABS
3.0000 mg | ORAL_TABLET | Freq: Once | ORAL | Status: AC
  Administered 2021-11-13: 3 mg via ORAL
  Filled 2021-11-13: qty 1

## 2021-11-13 MED ORDER — BUDESONIDE 0.5 MG/2ML IN SUSP
0.5000 mg | Freq: Two times a day (BID) | RESPIRATORY_TRACT | Status: DC
  Administered 2021-11-13 – 2021-11-14 (×3): 0.5 mg via RESPIRATORY_TRACT
  Filled 2021-11-13 (×2): qty 2

## 2021-11-13 MED ORDER — IPRATROPIUM-ALBUTEROL 0.5-2.5 (3) MG/3ML IN SOLN
3.0000 mL | Freq: Four times a day (QID) | RESPIRATORY_TRACT | Status: DC
  Administered 2021-11-14: 3 mL via RESPIRATORY_TRACT
  Filled 2021-11-13 (×2): qty 3

## 2021-11-13 NOTE — Progress Notes (Signed)
Progress Note    Keith Henry   [id number]  DOB: 20-Oct-1975  DOA: 11/12/2021     1 PCP: Pcp, No  Initial CC: SOB, cough, wheezing  Hospital Course: Keith Henry is a 46 yo male with PMH ongoing tobacco use, asthma who presented with worsening dyspnea and cough.  Symptoms have been ongoing for approximately 1 week.  He had quit smoking approximately 2 weeks prior to admission due to feeling poorly.  He states he wishes to remain abstinent from smoking at discharge.  Due to ongoing chest pain from his coughing and shortness of breath, he presented for further evaluation.  COVID testing was negative on admission.  He also underwent testing with RVP swab which was also negative.   CXR was negative for infiltrates or edema.  Given significant wheezing and presentation, he was admitted for asthma exacerbation/COPD exac treatment. He was started on nebulizers and steroids.  Interval History:  No events overnight.  Still having ongoing cough and shortness of breath.  Patient still too dyspneic with minimal exertion for going home today.  Assessment and Plan: * Acute asthma exacerbation - negative covid and RVP swabs - suspected precipitation from tobacco use and environmental triggers; still possible viral component  - Continue steroids and nebulizers - hold abx for now; check PCT  Tobacco use - Patient strongly encouraged to remain abstinent from smoking at discharge  Right wrist sprain - continue supportive care  Mild protein malnutrition (HCC) - Continue supplements  Hyperbilirubinemia - History of intermittent mild elevation - check direct/indrect bili in am - possible Gilbert's   Old records reviewed in assessment of this patient  Antimicrobials:   DVT prophylaxis:  SCDs Start: 11/12/21 0700   Code Status:   Code Status: Full Code  Mobility Assessment (last 72 hours)     Mobility Assessment     Row Name 11/13/21 0800 11/12/21 2007 11/12/21 1430        Does patient have an order for bedrest or is patient medically unstable No - Continue assessment No - Continue assessment No - Continue assessment     What is the highest level of mobility based on the progressive mobility assessment? Level 6 (Walks independently in room and hall) - Balance while walking in room without assist - Complete Level 6 (Walks independently in room and hall) - Balance while walking in room without assist - Complete Level 6 (Walks independently in room and hall) - Balance while walking in room without assist - Complete              Barriers to discharge:  Disposition Plan: Home tomorrow Status is: Observation  Objective: Blood pressure 136/79, pulse 77, temperature (!) 97.5 F (36.4 C), temperature source Oral, resp. rate 17, height 5\' 9"  (1.753 m), weight 95.8 kg, SpO2 97 %.  Examination:  Physical Exam Constitutional:      General: He is not in acute distress.    Appearance: Normal appearance.  HENT:     Head: Normocephalic and atraumatic.     Mouth/Throat:     Mouth: Mucous membranes are moist.  Eyes:     Extraocular Movements: Extraocular movements intact.  Cardiovascular:     Rate and Rhythm: Normal rate and regular rhythm.     Heart sounds: Normal heart sounds.  Pulmonary:     Breath sounds: Wheezing and rhonchi present.     Comments: Diffuse coarse breath sounds with wheezing Abdominal:     General: Bowel sounds are normal. There is  no distension.     Palpations: Abdomen is soft.     Tenderness: There is no abdominal tenderness.  Musculoskeletal:        General: Normal range of motion.     Cervical back: Normal range of motion and neck supple.  Skin:    General: Skin is warm and dry.  Neurological:     General: No focal deficit present.     Mental Status: He is alert.  Psychiatric:        Mood and Affect: Mood normal.        Behavior: Behavior normal.      Consultants:    Procedures:    Data Reviewed: Results for orders placed  or performed during the hospital encounter of 11/12/21 (from the past 24 hour(s))  Respiratory (~20 pathogens) panel by PCR     Status: None   Collection Time: 11/13/21  8:58 AM   Specimen: Nasopharyngeal Swab; Respiratory  Result Value Ref Range   Adenovirus NOT DETECTED NOT DETECTED   Coronavirus 229E NOT DETECTED NOT DETECTED   Coronavirus HKU1 NOT DETECTED NOT DETECTED   Coronavirus NL63 NOT DETECTED NOT DETECTED   Coronavirus OC43 NOT DETECTED NOT DETECTED   Metapneumovirus NOT DETECTED NOT DETECTED   Rhinovirus / Enterovirus NOT DETECTED NOT DETECTED   Influenza A NOT DETECTED NOT DETECTED   Influenza B NOT DETECTED NOT DETECTED   Parainfluenza Virus 1 NOT DETECTED NOT DETECTED   Parainfluenza Virus 2 NOT DETECTED NOT DETECTED   Parainfluenza Virus 3 NOT DETECTED NOT DETECTED   Parainfluenza Virus 4 NOT DETECTED NOT DETECTED   Respiratory Syncytial Virus NOT DETECTED NOT DETECTED   Bordetella pertussis NOT DETECTED NOT DETECTED   Bordetella Parapertussis NOT DETECTED NOT DETECTED   Chlamydophila pneumoniae NOT DETECTED NOT DETECTED   Mycoplasma pneumoniae NOT DETECTED NOT DETECTED    I have Reviewed nursing notes, Vitals, and Lab results since pt's last encounter. Pertinent lab results : see above I have ordered test including BMP, CBC, Mg I have reviewed the last note from staff over past 24 hours I have discussed pt's care plan and test results with nursing staff, case manager   LOS: 1 day   Dwyane Dee, MD [facility] 11/13/2021, 5:18 PM

## 2021-11-13 NOTE — Hospital Course (Signed)
Keith Henry is a 46 yo male with PMH ongoing tobacco use, asthma who presented with worsening dyspnea and cough.  Symptoms have been ongoing for approximately 1 week.  He had quit smoking approximately 2 weeks prior to admission due to feeling poorly.  He states he wishes to remain abstinent from smoking at discharge.  Due to ongoing chest pain from his coughing and shortness of breath, he presented for further evaluation.  COVID testing was negative on admission.  He also underwent testing with RVP swab which was also negative.   CXR was negative for infiltrates or edema.  Given significant wheezing and presentation, he was admitted for asthma exacerbation/COPD exac treatment. He was started on nebulizers and steroids.

## 2021-11-13 NOTE — Assessment & Plan Note (Signed)
Continue supplements

## 2021-11-13 NOTE — Assessment & Plan Note (Addendum)
-   negative covid and RVP swabs - suspected precipitation from tobacco use and environmental triggers; still possible viral component  - treated with steroids and nebulizers - hold abx for now; check PCT (negative) -Prednisone course continued at discharge

## 2021-11-13 NOTE — Progress Notes (Signed)
Pt refused for HIV test.

## 2021-11-13 NOTE — Assessment & Plan Note (Signed)
-   continue supportive care  

## 2021-11-13 NOTE — Assessment & Plan Note (Addendum)
-   History of intermittent mild elevation - possible Gilbert's

## 2021-11-13 NOTE — Assessment & Plan Note (Signed)
-   Patient strongly encouraged to remain abstinent from smoking at discharge

## 2021-11-14 ENCOUNTER — Other Ambulatory Visit (HOSPITAL_COMMUNITY): Payer: Self-pay

## 2021-11-14 DIAGNOSIS — J4531 Mild persistent asthma with (acute) exacerbation: Secondary | ICD-10-CM | POA: Diagnosis not present

## 2021-11-14 DIAGNOSIS — J4530 Mild persistent asthma, uncomplicated: Secondary | ICD-10-CM

## 2021-11-14 DIAGNOSIS — J45901 Unspecified asthma with (acute) exacerbation: Secondary | ICD-10-CM | POA: Diagnosis not present

## 2021-11-14 MED ORDER — FLUTICASONE-SALMETEROL 250-50 MCG/ACT IN AEPB
1.0000 | INHALATION_SPRAY | Freq: Two times a day (BID) | RESPIRATORY_TRACT | 3 refills | Status: DC
  Filled 2021-11-14: qty 60, 30d supply, fill #0
  Filled 2022-01-10: qty 60, 30d supply, fill #1

## 2021-11-14 MED ORDER — PREDNISONE 20 MG PO TABS
40.0000 mg | ORAL_TABLET | Freq: Every day | ORAL | 0 refills | Status: AC
  Filled 2021-11-14: qty 10, 5d supply, fill #0

## 2021-11-14 MED ORDER — ALBUTEROL SULFATE (2.5 MG/3ML) 0.083% IN NEBU
2.5000 mg | INHALATION_SOLUTION | Freq: Four times a day (QID) | RESPIRATORY_TRACT | 3 refills | Status: DC | PRN
  Filled 2021-11-14: qty 270, 23d supply, fill #0

## 2021-11-14 MED ORDER — MELATONIN 3 MG PO TABS
3.0000 mg | ORAL_TABLET | Freq: Once | ORAL | Status: AC
  Administered 2021-11-14: 3 mg via ORAL
  Filled 2021-11-14: qty 1

## 2021-11-14 MED ORDER — FLUTICASONE PROPIONATE HFA 110 MCG/ACT IN AERO
1.0000 | INHALATION_SPRAY | Freq: Two times a day (BID) | RESPIRATORY_TRACT | 3 refills | Status: DC
  Filled 2021-11-14 (×2): qty 12, 30d supply, fill #0

## 2021-11-14 MED ORDER — ALBUTEROL SULFATE HFA 108 (90 BASE) MCG/ACT IN AERS
2.0000 | INHALATION_SPRAY | RESPIRATORY_TRACT | 2 refills | Status: DC | PRN
  Filled 2021-11-14: qty 6.7, 17d supply, fill #0

## 2021-11-14 NOTE — Discharge Summary (Signed)
Physician Discharge Summary   Keith Henry AUQ:[id number] DOB: December 26, 1975 DOA: 11/12/2021  PCP: Pcp, No  Admit date: 11/12/2021 Discharge date: 11/14/2021 Barriers to discharge: none  Admitted From: Home Disposition:  Home Discharging physician: Lewie Chamber, MD  Recommendations for Outpatient Follow-up:  Establish with PCP Smoking cessation counseling   Home Health:  Equipment/Devices:   Discharge Condition: stable CODE STATUS: Full Diet recommendation:  Diet Orders (From admission, onward)     Start     Ordered   11/14/21 0000  Diet general        11/14/21 1320   11/12/21 0700  Diet regular Room service appropriate? Yes; Fluid consistency: Thin  Diet effective now       Question Answer Comment  Room service appropriate? Yes   Fluid consistency: Thin      11/12/21 0659            Hospital Course: Mr. Keith Henry is a 46 yo male with PMH ongoing tobacco use, asthma who presented with worsening dyspnea and cough.  Symptoms have been ongoing for approximately 1 week.  He had quit smoking approximately 2 weeks prior to admission due to feeling poorly.  He states he wishes to remain abstinent from smoking at discharge.  Due to ongoing chest pain from his coughing and shortness of breath, he presented for further evaluation.  COVID testing was negative on admission.  He also underwent testing with RVP swab which was also negative.   CXR was negative for infiltrates or edema.  Given significant wheezing and presentation, he was admitted for asthma exacerbation/COPD exac treatment. He was started on nebulizers and steroids.  Assessment and Plan: * Acute asthma exacerbation - negative covid and RVP swabs - suspected precipitation from tobacco use and environmental triggers; still possible viral component  - treated with steroids and nebulizers - hold abx for now; check PCT (negative) -Prednisone course continued at discharge  Mild persistent asthma - likely at least  mild persistent asthma - needs formal outpatient PFTs - unable to afford flovent; discussed with outpt pharmacy to find affordable maintenance inhaler (advair seems to be one of the few) - refilled alb nebs and alb inhaler - start advair - smoking cessation again recommended  Tobacco use - Patient strongly encouraged to remain abstinent from smoking at discharge  Right wrist sprain - continue supportive care  Mild protein malnutrition (HCC) - Continue supplements  Hyperbilirubinemia - History of intermittent mild elevation - 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  On day of discharge, patient was felt deemed stable for discharge. Patient/family member advised to call PCP or come back to ER if needed.   Principal Diagnosis: Acute asthma exacerbation  Discharge Diagnoses: Active Hospital Problems   Diagnosis Date Noted   Acute asthma exacerbation 11/12/2021    Priority: 1.   Mild persistent asthma 11/14/2021   Hyperbilirubinemia 11/12/2021   Mild protein malnutrition (HCC) 11/12/2021   Right wrist sprain 11/12/2021   Tobacco use 11/12/2021    Resolved Hospital Problems  No resolved problems to display.     Discharge Instructions     Diet general   Complete by: As directed    Increase activity slowly   Complete by: As directed       Allergies as of 11/14/2021       Reactions   Other Anaphylaxis   Pork   Methylprednisolone Sodium Succ Nausea And  Vomiting   Pork-derived Products         Medication List     STOP taking these medications    cyclobenzaprine 10 MG tablet Commonly known as: FLEXERIL   fluticasone 44 MCG/ACT inhaler Commonly known as: Flovent HFA   risperiDONE 1 MG tablet Commonly known as: RisperDAL       TAKE these medications    albuterol (2.5 MG/3ML) 0.083% nebulizer solution Commonly known as: PROVENTIL Inhale 3  mLs (2.5 mg total) by nebulization every 6 (six) hours as needed for wheezing or shortness of breath. What changed: Another medication with the same name was changed. Make sure you understand how and when to take each.   albuterol 108 (90 Base) MCG/ACT inhaler Commonly known as: VENTOLIN HFA Inhale 2 puffs into the lungs every 4 (four) hours as needed for wheezing or shortness of breath. What changed:  how much to take when to take this   fluticasone-salmeterol 250-50 MCG/ACT Aepb Commonly known as: ADVAIR Inhale 1 puff into the lungs in the morning and at bedtime.   predniSONE 20 MG tablet Commonly known as: DELTASONE Take 2 tablets (40 mg total) by mouth daily with breakfast for 5 days. Start taking on: November 15, 2021 What changed:  medication strength how much to take how to take this when to take this additional instructions        Follow-up Information     Primary Care Physician. Schedule an appointment as soon as possible for a visit.                 Allergies  Allergen Reactions   Other Anaphylaxis    Pork   Methylprednisolone Sodium Succ Nausea And Vomiting   Pork-Derived Products     Consultations:   Procedures:   Discharge Exam: BP (!) 166/93 (BP Location: Left Arm)   Pulse 78   Temp 98.3 F (36.8 C)   Resp 17   Ht  (1.753 m)   Wt 96.6 kg   SpO2 99%   BMI 31.44 kg/m  Physical Exam Constitutional:      General: He is not in acute distress.    Appearance: Normal appearance.  HENT:     Head: Normocephalic and atraumatic.     Mouth/Throat:     Mouth: Mucous membranes are moist.  Eyes:     Extraocular Movements: Extraocular movements intact.  Cardiovascular:     Rate and Rhythm: Normal rate and regular rhythm.     Heart sounds: Normal heart sounds.  Pulmonary:     Breath sounds: Wheezing present.     Comments: Improved breath sounds and improved wheezing  Abdominal:     General: Bowel sounds are normal. There is no  distension.     Palpations: Abdomen is soft.     Tenderness: There is no abdominal tenderness.  Musculoskeletal:        General: Normal range of motion.     Cervical back: Normal range of motion and neck supple.  Skin:    General: Skin is warm and dry.  Neurological:     General: No focal deficit present.     Mental Status: He is alert.  Psychiatric:        Mood and Affect: Mood normal.        Behavior: Behavior normal.      The results of significant diagnostics from this hospitalization (including imaging, microbiology, ancillary and laboratory) are listed below for reference.   Microbiology: Recent Results (from the  past 240 hour(s))  Resp Panel by RT-PCR (Flu A&B, Covid) Anterior Nasal Swab     Status: None   Collection Time: 11/12/21  6:31 AM   Specimen: Anterior Nasal Swab  Result Value Ref Range Status   SARS Coronavirus 2 by RT PCR NEGATIVE NEGATIVE Final    Comment: (NOTE) SARS-CoV-2 target nucleic acids are NOT DETECTED.  The SARS-CoV-2 RNA is generally detectable in upper respiratory specimens during the acute phase of infection. The lowest concentration of SARS-CoV-2 viral copies this assay can detect is 138 copies/mL. A negative result does not preclude SARS-Cov-2 infection and should not be used as the sole basis for treatment or other patient management decisions. A negative result may occur with  improper specimen collection/handling, submission of specimen other than nasopharyngeal swab, presence of viral mutation(s) within the areas targeted by this assay, and inadequate number of viral copies(<138 copies/mL). A negative result must be combined with clinical observations, patient history, and epidemiological information. The expected result is Negative.  Fact Sheet for Patients:  BloggerCourse.comhttps://www.fda.gov/media/152166/download  Fact Sheet for Healthcare Providers:  SeriousBroker.ithttps://www.fda.gov/media/152162/download  This test is no t yet approved or cleared by the  [place] FDA and  has been authorized for detection and/or diagnosis of SARS-CoV-2 by FDA under an Emergency Use Authorization (EUA). This EUA will remain  in effect (meaning this test can be used) for the duration of the COVID-19 declaration under Section 564(b)(1) of the Act, 21 U.S.C.section 360bbb-3(b)(1), unless the authorization is terminated  or revoked sooner.       Influenza A by PCR NEGATIVE NEGATIVE Final   Influenza B by PCR NEGATIVE NEGATIVE Final    Comment: (NOTE) The Xpert Xpress SARS-CoV-2/FLU/RSV plus assay is intended as an aid in the diagnosis of influenza from Nasopharyngeal swab specimens and should not be used as a sole basis for treatment. Nasal washings and aspirates are unacceptable for Xpert Xpress SARS-CoV-2/FLU/RSV testing.  Fact Sheet for Patients: BloggerCourse.comhttps://www.fda.gov/media/152166/download  Fact Sheet for Healthcare Providers: SeriousBroker.ithttps://www.fda.gov/media/152162/download  This test is not yet approved or cleared by the [place] FDA and has been authorized for detection and/or diagnosis of SARS-CoV-2 by FDA under an Emergency Use Authorization (EUA). This EUA will remain in effect (meaning this test can be used) for the duration of the COVID-19 declaration under Section 564(b)(1) of the Act, 21 U.S.C. section 360bbb-3(b)(1), unless the authorization is terminated or revoked.  Performed at [facility], [address] [place], [place] [id number]   Respiratory (~20 pathogens) panel by PCR     Status: None   Collection Time: 11/13/21  8:58 AM   Specimen: Nasopharyngeal Swab; Respiratory  Result Value Ref Range Status   Adenovirus NOT DETECTED NOT DETECTED Final   Coronavirus 229E NOT DETECTED NOT DETECTED Final    Comment: (NOTE) The Coronavirus on the Respiratory Panel, DOES NOT test for the novel  Coronavirus (2019 nCoV)    Coronavirus HKU1 NOT DETECTED NOT DETECTED Final   Coronavirus NL63 NOT DETECTED NOT DETECTED  Final   Coronavirus OC43 NOT DETECTED NOT DETECTED Final   Metapneumovirus NOT DETECTED NOT DETECTED Final   Rhinovirus / Enterovirus NOT DETECTED NOT DETECTED Final   Influenza A NOT DETECTED NOT DETECTED Final   Influenza B NOT DETECTED NOT DETECTED Final   Parainfluenza Virus 1 NOT DETECTED NOT DETECTED Final   Parainfluenza Virus 2 NOT DETECTED NOT DETECTED Final   Parainfluenza Virus 3 NOT DETECTED NOT DETECTED Final   Parainfluenza Virus 4 NOT DETECTED NOT DETECTED  Final   Respiratory Syncytial Virus NOT DETECTED NOT DETECTED Final   Bordetella pertussis NOT DETECTED NOT DETECTED Final   Bordetella Parapertussis NOT DETECTED NOT DETECTED Final   Chlamydophila pneumoniae NOT DETECTED NOT DETECTED Final   Mycoplasma pneumoniae NOT DETECTED NOT DETECTED Final    Comment: Performed at [facility], [address] [place], [place] [address]     Labs: BNP (last 3 results) Recent Labs    08/14/21 1023  BNP 7.3   Basic Metabolic Panel: Recent Labs  Lab 11/12/21 0155 11/12/21 0702  NA 138  --   K 5.0  --   CL 109  --   CO2 23  --   GLUCOSE 109*  --   BUN 10  --   CREATININE 1.05  --   CALCIUM 8.2*  --   MG  --  1.8   Liver Function Tests: Recent Labs  Lab 11/12/21 0155  AST 33  ALT 13  ALKPHOS 83  BILITOT 1.4*  PROT 6.2*  ALBUMIN 3.3*   No results for input(s): "LIPASE", "AMYLASE" in the last 168 hours. No results for input(s): "AMMONIA" in the last 168 hours. CBC: Recent Labs  Lab 11/12/21 0155  WBC 8.6  HGB 13.7  HCT 40.9  MCV 97.6  PLT 305   Cardiac Enzymes: No results for input(s): "CKTOTAL", "CKMB", "CKMBINDEX", "TROPONINI" in the last 168 hours. BNP: Invalid input(s): "POCBNP" CBG: No results for input(s): "GLUCAP" in the last 168 hours. D-Dimer No results for input(s): "DDIMER" in the last 72 hours. Hgb A1c No results for input(s): "HGBA1C" in the last 72 hours. Lipid Profile No results for input(s): "CHOL", "HDL", "LDLCALC",  "TRIG", "CHOLHDL", "LDLDIRECT" in the last 72 hours. Thyroid function studies No results for input(s): "TSH", "T4TOTAL", "T3FREE", "THYROIDAB" in the last 72 hours.  Invalid input(s): "FREET3" Anemia work up No results for input(s): "VITAMINB12", "FOLATE", "FERRITIN", "TIBC", "IRON", "RETICCTPCT" in the last 72 hours. Urinalysis    Component Value Date/Time   COLORURINE YELLOW 02/08/2021 0248   APPEARANCEUR HAZY (A) 02/08/2021 0248   LABSPEC 1.024 02/08/2021 0248   PHURINE 5.0 02/08/2021 0248   GLUCOSEU NEGATIVE 02/08/2021 0248   HGBUR NEGATIVE 02/08/2021 0248   BILIRUBINUR NEGATIVE 02/08/2021 0248   KETONESUR NEGATIVE 02/08/2021 0248   PROTEINUR NEGATIVE 02/08/2021 0248   NITRITE NEGATIVE 02/08/2021 0248   LEUKOCYTESUR TRACE (A) 02/08/2021 0248   Sepsis Labs Recent Labs  Lab 11/12/21 0155  WBC 8.6   Microbiology Recent Results (from the past 240 hour(s))  Resp Panel by RT-PCR (Flu A&B, Covid) Anterior Nasal Swab     Status: None   Collection Time: 11/12/21  6:31 AM   Specimen: Anterior Nasal Swab  Result Value Ref Range Status   SARS Coronavirus 2 by RT PCR NEGATIVE NEGATIVE Final    Comment: (NOTE) SARS-CoV-2 target nucleic acids are NOT DETECTED.  The SARS-CoV-2 RNA is generally detectable in upper respiratory specimens during the acute phase of infection. The lowest concentration of SARS-CoV-2 viral copies this assay can detect is 138 copies/mL. A negative result does not preclude SARS-Cov-2 infection and should not be used as the sole basis for treatment or other patient management decisions. A negative result may occur with  improper specimen collection/handling, submission of specimen other than nasopharyngeal swab, presence of viral mutation(s) within the areas targeted by this assay, and inadequate number of viral copies(<138 copies/mL). A negative result must be combined with clinical observations, patient history, and epidemiological information. The  expected  result is Negative.  Fact Sheet for Patients:  EntrepreneurPulse.com.au  Fact Sheet for Healthcare Providers:  IncredibleEmployment.be  This test is no t yet approved or cleared by the [place] FDA and  has been authorized for detection and/or diagnosis of SARS-CoV-2 by FDA under an Emergency Use Authorization (EUA). This EUA will remain  in effect (meaning this test can be used) for the duration of the COVID-19 declaration under Section 564(b)(1) of the Act, 21 U.S.C.section 360bbb-3(b)(1), unless the authorization is terminated  or revoked sooner.       Influenza A by PCR NEGATIVE NEGATIVE Final   Influenza B by PCR NEGATIVE NEGATIVE Final    Comment: (NOTE) The Xpert Xpress SARS-CoV-2/FLU/RSV plus assay is intended as an aid in the diagnosis of influenza from Nasopharyngeal swab specimens and should not be used as a sole basis for treatment. Nasal washings and aspirates are unacceptable for Xpert Xpress SARS-CoV-2/FLU/RSV testing.  Fact Sheet for Patients: EntrepreneurPulse.com.au  Fact Sheet for Healthcare Providers: IncredibleEmployment.be  This test is not yet approved or cleared by the [place] FDA and has been authorized for detection and/or diagnosis of SARS-CoV-2 by FDA under an Emergency Use Authorization (EUA). This EUA will remain in effect (meaning this test can be used) for the duration of the COVID-19 declaration under Section 564(b)(1) of the Act, 21 U.S.C. section 360bbb-3(b)(1), unless the authorization is terminated or revoked.  Performed at [facility], [place] [address], [place], [place] [address]   Respiratory (~20 pathogens) panel by PCR     Status: None   Collection Time: 11/13/21  8:58 AM   Specimen: Nasopharyngeal Swab; Respiratory  Result Value Ref Range Status   Adenovirus NOT DETECTED NOT DETECTED Final   Coronavirus 229E NOT DETECTED  NOT DETECTED Final    Comment: (NOTE) The Coronavirus on the Respiratory Panel, DOES NOT test for the novel  Coronavirus (2019 nCoV)    Coronavirus HKU1 NOT DETECTED NOT DETECTED Final   Coronavirus NL63 NOT DETECTED NOT DETECTED Final   Coronavirus OC43 NOT DETECTED NOT DETECTED Final   Metapneumovirus NOT DETECTED NOT DETECTED Final   Rhinovirus / Enterovirus NOT DETECTED NOT DETECTED Final   Influenza A NOT DETECTED NOT DETECTED Final   Influenza B NOT DETECTED NOT DETECTED Final   Parainfluenza Virus 1 NOT DETECTED NOT DETECTED Final   Parainfluenza Virus 2 NOT DETECTED NOT DETECTED Final   Parainfluenza Virus 3 NOT DETECTED NOT DETECTED Final   Parainfluenza Virus 4 NOT DETECTED NOT DETECTED Final   Respiratory Syncytial Virus NOT DETECTED NOT DETECTED Final   Bordetella pertussis NOT DETECTED NOT DETECTED Final   Bordetella Parapertussis NOT DETECTED NOT DETECTED Final   Chlamydophila pneumoniae NOT DETECTED NOT DETECTED Final   Mycoplasma pneumoniae NOT DETECTED NOT DETECTED Final    Comment: Performed at [facility], [place]. [address], [place], [place] [address]    Procedures/Studies: DG Chest Port 1 View  Result Date: 11/12/2021 CLINICAL DATA:  Dyspnea, asthma EXAM: PORTABLE CHEST 1 VIEW COMPARISON:  10/31/2021 FINDINGS: Lungs are well expanded, symmetric, and clear. No pneumothorax or pleural effusion. Cardiac size within normal limits. Pulmonary vascularity is normal. Osseous structures are age-appropriate. Healed right clavicle fracture again noted. No acute bone abnormality. IMPRESSION: No active disease. Electronically Signed   By: Fidela Salisbury M.D.   On: 11/12/2021 02:07   DG Chest 2 View  Result Date: 10/31/2021 CLINICAL DATA:  Shortness of breath EXAM: CHEST - 2 VIEW COMPARISON:  Radiographs 10/31/2021 at  7:08 BM FINDINGS: No focal consolidation, pleural effusion, or pneumothorax. Normal cardiomediastinal silhouette. No acute osseous abnormality. Chronic  posttraumatic deformity right clavicle IMPRESSION: No active cardiopulmonary disease. Electronically Signed   By: Minerva Fester M.D.   On: 10/31/2021 23:22   DG Chest 2 View  Result Date: 10/31/2021 CLINICAL DATA:  Asthma EXAM: CHEST - 2 VIEW COMPARISON:  10/21/2021 FINDINGS: The heart size and mediastinal contours are within normal limits. Both lungs are clear. Old right clavicle fracture. IMPRESSION: No active cardiopulmonary disease. Electronically Signed   By: Jasmine Pang M.D.   On: 10/31/2021 19:08   DG Wrist Complete Right  Result Date: 10/31/2021 CLINICAL DATA:  Wrist pain EXAM: RIGHT WRIST - COMPLETE 3+ VIEW COMPARISON:  05/13/2008 FINDINGS: There is chronic fracture deformity involving the mid to distal scaphoid. On dedicated scaphoid view, there is potential acute fracture lucency through the mid scaphoid. No malalignment. Degenerative changes at the first [facility] joint and distal radiocarpal joint. IMPRESSION: Findings suggestive of acute on chronic scaphoid fracture, correlate for snuffbox tenderness Electronically Signed   By: Jasmine Pang M.D.   On: 10/31/2021 19:07   DG Chest 2 View  Result Date: 10/21/2021 CLINICAL DATA:  Shortness of breath, wheezing EXAM: CHEST - 2 VIEW COMPARISON:  09/20/2021 FINDINGS: The heart size and mediastinal contours are within normal limits. Both lungs are clear. Old healed fracture is seen in right clavicle. IMPRESSION: No active cardiopulmonary disease. Electronically Signed   By: Ernie Avena M.D.   On: 10/21/2021 17:10     Time coordinating discharge: Over 30 minutes    Lewie Chamber, MD  [facility] 11/14/2021, 4:14 PM

## 2021-11-14 NOTE — Progress Notes (Signed)
Mobility Specialist - Progress Note   11/14/21 1508  Mobility  Activity Ambulated independently in hallway  Level of Assistance Independent  Assistive Device None  Distance Ambulated (ft) 500 ft  Range of Motion/Exercises Active  Activity Response Tolerated well  Mobility Referral Yes  $Mobility charge 1 Mobility   Pt received in bed and agreeable to mobility. No complaints during session. Pt to bed after session with all needs met.      During mobility: 96-98% SpO2   Set designer

## 2021-11-14 NOTE — TOC Transition Note (Signed)
Transition of Care [facility]) - CM/SW Discharge Note   Patient Details  Name: Keith Henry MRN: [id number] Date of Birth: [birth year]  [facility]) CM/SW Contact:  Dessa Phi, RN Phone Number: 11/14/2021, 3:00 PM   Clinical Narrative: spoke to patient in rm about d/c plans-d/c home;provided w/pcp listing,informed to contact cust service on insurance card also for pcp listing in network;provided w/bus pass(given to nurse);meds to bed through [facility] otpt pharmacy they will deliver to rm prior d/c;informed of social service resource for disability f/u. Patient voiced understanding. No further CM needs.      Final next level of care: Home/Self Care Barriers to Discharge: No Barriers Identified   Patient Goals and CMS Choice Patient states their goals for this hospitalization and ongoing recovery are::  (Home) CMS Medicare.gov Compare Post Acute Care list provided to:: Patient Choice offered to / list presented to : NA  Discharge Placement                       Discharge Plan and Services   Discharge Planning Services: CM Consult                                 Social Determinants of Health (SDOH) Interventions Housing Interventions: Inpatient TOC   Readmission Risk Interventions    11/12/2021    3:32 PM 02/13/2021   12:04 PM  Readmission Risk Prevention Plan  Transportation Screening Complete Complete  PCP or Specialist Appt within 5-7 Days  Not Complete  Not Complete comments  soonest apt 03/20/21  PCP or Specialist Appt within 3-5 Days Complete   Home Care Screening Not Completed Comments  patient is homeless. stays at friends when he can  Medication Review (RN CM)  Complete  HRI or Home Care Consult Complete   Palliative Care Screening Not Applicable   Medication Review (RN Care Manager) Complete

## 2021-11-14 NOTE — Progress Notes (Signed)
SATURATION QUALIFICATIONS: (This note is used to comply with regulatory documentation for home oxygen)  Patient Saturations on Room Air at Rest = 95%  Patient Saturations on Room Air while Ambulating = 97%  Patient Saturations on N/A Liters of oxygen while Ambulating = N/A%  Please briefly explain why patient needs home oxygen:

## 2021-11-14 NOTE — Assessment & Plan Note (Signed)
-   likely at least mild persistent asthma - needs formal outpatient PFTs - unable to afford flovent; discussed with outpt pharmacy to find affordable maintenance inhaler (advair seems to be one of the few) - refilled alb nebs and alb inhaler - start advair - smoking cessation again recommended

## 2021-11-14 NOTE — TOC Transition Note (Signed)
Transition of Care [facility]) - CM/SW Discharge Note   Patient Details  Name: Keith Henry MRN: [id number] Date of Birth: [birth year]  Transition of Care [facility]) CM/SW Contact:  Dessa Phi, RN Phone Number: 11/14/2021, 1:58 PM   Clinical Narrative: No orders or CM needs.      Final next level of care: Home/Self Care Barriers to Discharge: No Barriers Identified   Patient Goals and CMS Choice Patient states their goals for this hospitalization and ongoing recovery are::  (Home) CMS Medicare.gov Compare Post Acute Care list provided to:: Patient Choice offered to / list presented to : NA  Discharge Placement                       Discharge Plan and Services   Discharge Planning Services: CM Consult                                 Social Determinants of Health (SDOH) Interventions Housing Interventions: Inpatient TOC   Readmission Risk Interventions    11/12/2021    3:32 PM 02/13/2021   12:04 PM  Readmission Risk Prevention Plan  Transportation Screening Complete Complete  PCP or Specialist Appt within 5-7 Days  Not Complete  Not Complete comments  soonest apt 03/20/21  PCP or Specialist Appt within 3-5 Days Complete   Home Care Screening Not Completed Comments  patient is homeless. stays at friends when he can  Medication Review (RN CM)  Complete  HRI or Home Care Consult Complete   Palliative Care Screening Not Applicable   Medication Review (RN Care Manager) Complete

## 2021-11-14 NOTE — Progress Notes (Signed)
Patient complained of shortness of breath. Oxygen sat ranges from 95-96%. Albuterol nebulization solution given. Placed on moderate high back rest.

## 2021-11-14 NOTE — Progress Notes (Signed)
Mobility Specialist - Progress Note   11/14/21 1157  Mobility  Activity Ambulated independently in hallway  Level of Assistance Independent  Assistive Device None  Distance Ambulated (ft) 500 ft  Range of Motion/Exercises Active  Activity Response Tolerated well  Mobility Referral Yes  $Mobility charge 1 Mobility   Pt received in bed and agreeable to mobility. Pt had some wheezing during session & coughing at EOS. Pt to bed after session with all needs met.    Post-mobility: 98% SPO2  Set designer

## 2021-11-15 ENCOUNTER — Other Ambulatory Visit (HOSPITAL_COMMUNITY): Payer: Self-pay

## 2021-12-07 ENCOUNTER — Encounter (HOSPITAL_COMMUNITY): Payer: Self-pay | Admitting: Emergency Medicine

## 2021-12-07 ENCOUNTER — Emergency Department (HOSPITAL_COMMUNITY): Payer: Commercial Managed Care - HMO

## 2021-12-07 ENCOUNTER — Other Ambulatory Visit: Payer: Self-pay

## 2021-12-07 ENCOUNTER — Emergency Department (HOSPITAL_COMMUNITY)
Admission: EM | Admit: 2021-12-07 | Discharge: 2021-12-08 | Disposition: A | Discharge status: Home / Self Care | Payer: Commercial Managed Care - HMO | Attending: Emergency Medicine | Admitting: Emergency Medicine

## 2021-12-07 DIAGNOSIS — F17200 Nicotine dependence, unspecified, uncomplicated: Secondary | ICD-10-CM | POA: Diagnosis not present

## 2021-12-07 DIAGNOSIS — Z1152 Encounter for screening for COVID-19: Secondary | ICD-10-CM | POA: Diagnosis not present

## 2021-12-07 DIAGNOSIS — J4541 Moderate persistent asthma with (acute) exacerbation: Secondary | ICD-10-CM | POA: Diagnosis not present

## 2021-12-07 DIAGNOSIS — R0602 Shortness of breath: Secondary | ICD-10-CM | POA: Diagnosis present

## 2021-12-07 DIAGNOSIS — Z7951 Long term (current) use of inhaled steroids: Secondary | ICD-10-CM | POA: Diagnosis not present

## 2021-12-07 LAB — RESP PANEL BY RT-PCR (FLU A&B, COVID) ARPGX2
Influenza A by PCR: NEGATIVE
Influenza B by PCR: NEGATIVE
SARS Coronavirus 2 by RT PCR: NEGATIVE

## 2021-12-07 MED ORDER — PREDNISONE 20 MG PO TABS
60.0000 mg | ORAL_TABLET | Freq: Once | ORAL | Status: AC
  Administered 2021-12-07: 60 mg via ORAL
  Filled 2021-12-07: qty 3

## 2021-12-07 MED ORDER — IPRATROPIUM-ALBUTEROL 0.5-2.5 (3) MG/3ML IN SOLN
3.0000 mL | RESPIRATORY_TRACT | Status: AC
  Administered 2021-12-07 (×3): 3 mL via RESPIRATORY_TRACT
  Filled 2021-12-07: qty 6
  Filled 2021-12-07: qty 3

## 2021-12-07 MED ORDER — ALBUTEROL SULFATE HFA 108 (90 BASE) MCG/ACT IN AERS
2.0000 | INHALATION_SPRAY | RESPIRATORY_TRACT | Status: DC | PRN
  Administered 2021-12-07: 2 via RESPIRATORY_TRACT
  Filled 2021-12-07: qty 6.7

## 2021-12-07 NOTE — ED Provider Triage Note (Signed)
Emergency Medicine Provider Triage Evaluation Note  Keith Henry , a 46 y.o. male  was evaluated in triage.  Pt complains of shortness of breath, chest tightness and cough.  Patient states that symptoms been present for the past 3 days.  States he feels like prior asthma exacerbations.  Denies current smoking.  States he has been using his nebulizer 4-6 times a day.  He has been out of his albuterol rescue inhaler for 7 days.  Denies fever, chills, night sweats, abdominal pain, nausea, vomiting.  .  Review of Systems  Positive: See above Negative:   Physical Exam  BP 136/83 (BP Location: Right Arm)   Pulse 80   Temp 98 F (36.7 C) (Oral)   Resp (!) 22   Ht 5\' 9"  (1.753 m)   Wt 95.3 kg   SpO2 99%   BMI 31.01 kg/m  Gen:   Awake, no distress   Resp:  Normal effort  MSK:   Moves extremities without difficulty  Other:  Diffuse wheeze/rhonchi on lung exam.  Medical Decision Making  Medically screening exam initiated at 7:33 PM.  Appropriate orders placed.  [name] was informed that the remainder of the evaluation will be completed by another provider, this initial triage assessment does not replace that evaluation, and the importance of remaining in the ED until their evaluation is complete.     Wilnette Kales, [place] 12/07/21 1935

## 2021-12-07 NOTE — ED Provider Notes (Signed)
[facility] DEPT Provider Note   CSN: [id number] Arrival date & time: 12/07/21  1747     History {Add pertinent medical, surgical, social history, OB history to HPI:1} Chief Complaint  Patient presents with   Shortness of Breath    Keith Henry is a 46 y.o. male.  46 yo M with a chief complaints of shortness of breath and cough.  This been going on for a few days.  Patient tells me that it is very bad asthma and this happens to him frequently.  Was just hospitalized about a month ago for similar.  He was discharged home but states that he did not get all of his medications.  He thought the therapist be sent to him in the mail but never received them.  He denies any fevers.  Continues to smoke.   Shortness of Breath      Home Medications Prior to Admission medications   Medication Sig Start Date End Date Taking? Authorizing Provider  albuterol (PROVENTIL) (2.5 MG/3ML) 0.083% nebulizer solution Inhale 3 mLs (2.5 mg total) by nebulization every 6 (six) hours as needed for wheezing or shortness of breath. 11/14/21  Yes Dwyane Dee, MD  albuterol (VENTOLIN HFA) 108 (90 Base) MCG/ACT inhaler Inhale 2 puffs into the lungs every 4 (four) hours as needed for wheezing or shortness of breath. 11/14/21 12/14/21 Yes Dwyane Dee, MD  fluticasone-salmeterol (ADVAIR) 250-50 MCG/ACT AEPB Inhale 1 puff into the lungs in the morning and at bedtime. 11/14/21  Yes Dwyane Dee, MD      Allergies    Other, Methylprednisolone sodium succ, and Pork-derived products    Review of Systems   Review of Systems  Respiratory:  Positive for shortness of breath.     Physical Exam Updated Vital Signs BP 130/78   Pulse 78   Temp 98 F (36.7 C) (Oral)   Resp (!) 22   Ht 5\' 9"  (1.753 m)   Wt 95.3 kg   SpO2 100%   BMI 31.01 kg/m  Physical Exam Vitals and nursing note reviewed.  Constitutional:      Appearance: He is well-developed.  HENT:     Head:  Normocephalic and atraumatic.  Eyes:     Pupils: Pupils are equal, round, and reactive to light.  Neck:     Vascular: No JVD.  Cardiovascular:     Rate and Rhythm: Normal rate and regular rhythm.     Heart sounds: No murmur heard.    No friction rub. No gallop.  Pulmonary:     Effort: No respiratory distress.     Breath sounds: Wheezing present.     Comments: Wheezes in all fields with prolonged expiratory effort. Abdominal:     General: There is no distension.     Tenderness: There is no abdominal tenderness. There is no guarding or rebound.  Musculoskeletal:        General: Normal range of motion.     Cervical back: Normal range of motion and neck supple.  Skin:    Coloration: Skin is not pale.     Findings: No rash.  Neurological:     Mental Status: He is alert and oriented to person, place, and time.  Psychiatric:        Behavior: Behavior normal.     ED Results / Procedures / Treatments   Labs (all labs ordered are listed, but only abnormal results are displayed) Labs Reviewed  RESP PANEL BY RT-PCR (FLU A&B, COVID) ARPGX2  EKG None  Radiology DG Chest 2 View  Result Date: 12/07/2021 CLINICAL DATA:  Cough, shortness of breath, wheezing EXAM: CHEST - 2 VIEW COMPARISON:  11/12/2021 FINDINGS: The heart size and mediastinal contours are within normal limits. Both lungs are clear. The deformity in right clavicle suggesting malunited old fracture. IMPRESSION: No active cardiopulmonary disease. Electronically Signed   By: Ernie Avena M.D.   On: 12/07/2021 20:07    Procedures Procedures  {Document cardiac monitor, telemetry assessment procedure when appropriate:1}  Medications Ordered in ED Medications  albuterol (VENTOLIN HFA) 108 (90 Base) MCG/ACT inhaler 2 puff (2 puffs Inhalation Given 12/07/21 2008)  ipratropium-albuterol (DUONEB) 0.5-2.5 (3) MG/3ML nebulizer solution 3 mL (has no administration in time range)  predniSONE (DELTASONE) tablet 60 mg (60 mg  Oral Given 12/07/21 2008)    ED Course/ Medical Decision Making/ A&P                           Medical Decision Making Risk Prescription drug management.   46 yo M with a chief complaints of an asthma exacerbation.  This is a recurrent problem for him.  He has no PCP.  He was just hospitalized and discharged and did not get all of his medications.  On my record review it seems that he was post to pick these up at the [facility] long pharmacy somehow that was lost in communication.  He will pick these up on Monday.  Still having some wheezing despite getting a breathing treatment in triage and 60 mg of prednisone.  We will give 3 DuoNebs back-to-back and reassess.  {Document critical care time when appropriate:1} {Document review of labs and clinical decision tools ie heart score, Chads2Vasc2 etc:1}  {Document your independent review of radiology images, and any outside records:1} {Document your discussion with family members, caretakers, and with consultants:1} {Document social determinants of health affecting pt's care:1} {Document your decision making why or why not admission, treatments were needed:1} Final Clinical Impression(s) / ED Diagnoses Final diagnoses:  None    Rx / DC Orders ED Discharge Orders     None

## 2021-12-07 NOTE — ED Triage Notes (Signed)
Pt in with sob, wheezing. Hx of asthma, states he has run out of inhalers at home. Sats 100% on RA, able to speak in short sentences

## 2021-12-08 MED ORDER — PREDNISONE 20 MG PO TABS: ORAL_TABLET | ORAL | 0 refills | Status: DC

## 2021-12-08 NOTE — Discharge Instructions (Signed)
Use your inhaler every 4 hours(6 puffs) while awake, return for sudden worsening shortness of breath, or if you need to use your inhaler more often.   Smoking is bad for you.  Try your best to stop.  There is information in this paperwork if you are needing help that you can call and they can try to find someone that could prescribe you medications or counseling that could help.

## 2021-12-29 ENCOUNTER — Emergency Department (HOSPITAL_COMMUNITY): Payer: Commercial Managed Care - HMO

## 2021-12-29 ENCOUNTER — Other Ambulatory Visit: Payer: Self-pay

## 2021-12-29 ENCOUNTER — Inpatient Hospital Stay (HOSPITAL_COMMUNITY)
Admission: EM | Admit: 2021-12-29 | Discharge: 2022-01-02 | DRG: 203 | Disposition: A | Discharge status: Home / Self Care | Payer: Commercial Managed Care - HMO | Attending: Internal Medicine | Admitting: Internal Medicine

## 2021-12-29 ENCOUNTER — Encounter (HOSPITAL_COMMUNITY): Payer: Self-pay

## 2021-12-29 DIAGNOSIS — J45901 Unspecified asthma with (acute) exacerbation: Secondary | ICD-10-CM | POA: Diagnosis present

## 2021-12-29 DIAGNOSIS — Z8249 Family history of ischemic heart disease and other diseases of the circulatory system: Secondary | ICD-10-CM

## 2021-12-29 DIAGNOSIS — Z20822 Contact with and (suspected) exposure to covid-19: Secondary | ICD-10-CM | POA: Diagnosis present

## 2021-12-29 DIAGNOSIS — Z91148 Patient's other noncompliance with medication regimen for other reason: Secondary | ICD-10-CM

## 2021-12-29 DIAGNOSIS — J45909 Unspecified asthma, uncomplicated: Secondary | ICD-10-CM | POA: Diagnosis present

## 2021-12-29 DIAGNOSIS — J4541 Moderate persistent asthma with (acute) exacerbation: Principal | ICD-10-CM | POA: Diagnosis present

## 2021-12-29 DIAGNOSIS — Z720 Tobacco use: Secondary | ICD-10-CM | POA: Diagnosis present

## 2021-12-29 DIAGNOSIS — Z87891 Personal history of nicotine dependence: Secondary | ICD-10-CM

## 2021-12-29 LAB — BLOOD GAS, VENOUS
Acid-Base Excess: 3.7 mmol/L — ABNORMAL HIGH (ref 0.0–2.0)
Bicarbonate: 29.7 mmol/L — ABNORMAL HIGH (ref 20.0–28.0)
O2 Saturation: 92.3 %
Patient temperature: 37
pCO2, Ven: 49 mmHg (ref 44–60)
pH, Ven: 7.39 (ref 7.25–7.43)
pO2, Ven: 61 mmHg — ABNORMAL HIGH (ref 32–45)

## 2021-12-29 MED ORDER — IPRATROPIUM-ALBUTEROL 0.5-2.5 (3) MG/3ML IN SOLN
3.0000 mL | Freq: Once | RESPIRATORY_TRACT | Status: AC
  Administered 2021-12-30: 3 mL via RESPIRATORY_TRACT
  Filled 2021-12-29 (×2): qty 3

## 2021-12-29 NOTE — ED Notes (Signed)
Save blue tube in main lab °

## 2021-12-29 NOTE — ED Triage Notes (Signed)
States that he has been having issues with his asthma, states he is using his neb treatments at home w/out any relief

## 2021-12-29 NOTE — ED Provider Triage Note (Signed)
Emergency Medicine Provider Triage Evaluation Note  Keith Henry , a 46 y.o. male  was evaluated in triage.  Pt complains of loss of asthma exacerbation over the last 3 to 4 days.  Patient has a rescue inhaler, and nebulizer at home, ran out of his rescue inhaler but has been using his nebulizer 2-3 times daily.  He denies any fever, chills.  He does report some central chest pain along with the shortness of breath.  He denies nausea, vomiting  Review of Systems  Positive:  shortness of breath, wheezing, chest pain Negative: Fever, chills  Physical Exam  BP (!) 157/86 (BP Location: Right Arm)   Pulse 92   Temp 98 F (36.7 C) (Oral)   Resp (!) 22   Ht 5\' 9"  (1.753 m)   Wt 94.8 kg   SpO2 96%   BMI 30.86 kg/m  Gen:   Awake, no distress  Resp:  Normal effort  MSK:   Moves extremities without difficulty  Other:  Bilateral wheezing biphasic  Medical Decision Making  Medically screening exam initiated at 11:32 PM.  Appropriate orders placed.  was informed that the remainder of the evaluation will be completed by another provider, this initial triage assessment does not replace that evaluation, and the importance of remaining in the ED until their evaluation is complete.  Workup initiated   [name], Olene Floss 12/29/21 2334

## 2021-12-29 NOTE — ED Notes (Signed)
Placed the patient on 2L

## 2021-12-30 DIAGNOSIS — J45901 Unspecified asthma with (acute) exacerbation: Secondary | ICD-10-CM

## 2021-12-30 DIAGNOSIS — Z720 Tobacco use: Secondary | ICD-10-CM

## 2021-12-30 LAB — RESP PANEL BY RT-PCR (FLU A&B, COVID) ARPGX2
Influenza A by PCR: NEGATIVE
Influenza B by PCR: NEGATIVE
SARS Coronavirus 2 by RT PCR: NEGATIVE

## 2021-12-30 LAB — BASIC METABOLIC PANEL
Anion gap: 9 (ref 5–15)
BUN: 17 mg/dL (ref 6–20)
CO2: 23 mmol/L (ref 22–32)
Calcium: 8.7 mg/dL — ABNORMAL LOW (ref 8.9–10.3)
Chloride: 109 mmol/L (ref 98–111)
Creatinine, Ser: 0.96 mg/dL (ref 0.61–1.24)
GFR, Estimated: >60 mL/min (ref >60)
Glucose, Bld: 106 mg/dL — ABNORMAL HIGH (ref 70–99)
Potassium: 4.2 mmol/L (ref 3.5–5.1)
Sodium: 141 mmol/L (ref 135–145)

## 2021-12-30 LAB — CBC
HCT: 41.4 % (ref 39.0–52.0)
Hemoglobin: 13.5 g/dL (ref 13.0–17.0)
MCH: 32.2 pg (ref 26.0–34.0)
MCHC: 32.6 g/dL (ref 30.0–36.0)
MCV: 98.8 fL (ref 80.0–100.0)
Platelets: 338 10*3/uL (ref 150–400)
RBC: 4.19 MIL/uL — ABNORMAL LOW (ref 4.22–5.81)
RDW: 12.6 % (ref 11.5–15.5)
WBC: 12.3 10*3/uL — ABNORMAL HIGH (ref 4.0–10.5)
nRBC: 0 % (ref 0.0–0.2)

## 2021-12-30 LAB — TROPONIN I (HIGH SENSITIVITY): Troponin I (High Sensitivity): 2 ng/L (ref <18)

## 2021-12-30 MED ORDER — ONDANSETRON HCL 4 MG/2ML IJ SOLN: 4.0000 mg | Freq: Four times a day (QID) | INTRAMUSCULAR | Status: DC | PRN

## 2021-12-30 MED ORDER — ALBUTEROL SULFATE (2.5 MG/3ML) 0.083% IN NEBU
10.0000 mg/h | INHALATION_SOLUTION | Freq: Once | RESPIRATORY_TRACT | Status: AC
  Administered 2021-12-30: 10 mg/h via RESPIRATORY_TRACT
  Filled 2021-12-30 (×2): qty 3

## 2021-12-30 MED ORDER — ACETAMINOPHEN 325 MG PO TABS
650.0000 mg | ORAL_TABLET | Freq: Four times a day (QID) | ORAL | Status: DC | PRN
  Administered 2021-12-30: 650 mg via ORAL
  Filled 2021-12-30: qty 2

## 2021-12-30 MED ORDER — PREDNISONE 20 MG PO TABS
40.0000 mg | ORAL_TABLET | Freq: Every day | ORAL | Status: DC
  Administered 2021-12-30 – 2021-12-31 (×2): 40 mg via ORAL
  Filled 2021-12-30 (×2): qty 2

## 2021-12-30 MED ORDER — ALBUTEROL SULFATE (2.5 MG/3ML) 0.083% IN NEBU
INHALATION_SOLUTION | RESPIRATORY_TRACT | Status: AC
  Filled 2021-12-30: qty 12

## 2021-12-30 MED ORDER — ACETAMINOPHEN 650 MG RE SUPP: 650.0000 mg | Freq: Four times a day (QID) | RECTAL | Status: DC | PRN

## 2021-12-30 MED ORDER — ONDANSETRON HCL 4 MG/2ML IJ SOLN
4.0000 mg | Freq: Once | INTRAMUSCULAR | Status: AC
  Administered 2021-12-30: 4 mg via INTRAVENOUS
  Filled 2021-12-30: qty 2

## 2021-12-30 MED ORDER — MONTELUKAST SODIUM 10 MG PO TABS
10.0000 mg | ORAL_TABLET | Freq: Every day | ORAL | Status: DC
  Administered 2021-12-30 – 2022-01-01 (×3): 10 mg via ORAL
  Filled 2021-12-30 (×4): qty 1

## 2021-12-30 MED ORDER — ALBUTEROL SULFATE (2.5 MG/3ML) 0.083% IN NEBU
INHALATION_SOLUTION | RESPIRATORY_TRACT | Status: AC
  Administered 2021-12-30: 2.5 mg
  Filled 2021-12-30: qty 3

## 2021-12-30 MED ORDER — ONDANSETRON HCL 4 MG PO TABS: 4.0000 mg | ORAL_TABLET | Freq: Four times a day (QID) | ORAL | Status: DC | PRN

## 2021-12-30 MED ORDER — MAGNESIUM SULFATE 2 GM/50ML IV SOLN
2.0000 g | Freq: Once | INTRAVENOUS | Status: AC
  Administered 2021-12-30: 2 g via INTRAVENOUS
  Filled 2021-12-30: qty 50

## 2021-12-30 MED ORDER — SODIUM CHLORIDE 0.9 % IV BOLUS
1000.0000 mL | Freq: Once | INTRAVENOUS | Status: AC
  Administered 2021-12-30: 1000 mL via INTRAVENOUS

## 2021-12-30 MED ORDER — IPRATROPIUM-ALBUTEROL 0.5-2.5 (3) MG/3ML IN SOLN
3.0000 mL | RESPIRATORY_TRACT | Status: DC
  Administered 2021-12-30 – 2022-01-02 (×20): 3 mL via RESPIRATORY_TRACT
  Filled 2021-12-30 (×19): qty 3

## 2021-12-30 MED ORDER — METHYLPREDNISOLONE SODIUM SUCC 125 MG IJ SOLR
INTRAMUSCULAR | Status: AC
  Filled 2021-12-30: qty 2

## 2021-12-30 MED ORDER — ACETAMINOPHEN 325 MG PO TABS
650.0000 mg | ORAL_TABLET | Freq: Once | ORAL | Status: AC
  Administered 2021-12-30: 650 mg via ORAL
  Filled 2021-12-30: qty 2

## 2021-12-30 MED ORDER — ALBUTEROL SULFATE (2.5 MG/3ML) 0.083% IN NEBU: 2.5000 mg | INHALATION_SOLUTION | RESPIRATORY_TRACT | Status: DC | PRN

## 2021-12-30 MED ORDER — IPRATROPIUM-ALBUTEROL 0.5-2.5 (3) MG/3ML IN SOLN
3.0000 mL | Freq: Once | RESPIRATORY_TRACT | Status: AC
  Administered 2021-12-30: 3 mL via RESPIRATORY_TRACT

## 2021-12-30 NOTE — Subjective & Objective (Signed)
CC: SOB, wheezing HPI: 46 yo AAM with hx of uncontrolled asthma, tobacco abuse, presents to ER with 1 week hx of SOB, cough, wheezing. Pt claims he quit smoking. Declines to say how long it's been without cigarettes. Pt with hx of non-compliance with picking up his discharge medications. No fever or chills. States he is out of his albuterol rescue inhaler. He has not yet found a PCP or made an appointment to see a PCP despite numerous recommendations to find a PCP.  Presented to ER with SOB and wheezing.  Arrival temp 98.0 heart rate 82 blood pressure 157/86 satting 96% on room air.  Patient received 2 albuterol nebulizer treatments and was started on a continuous nebulizer treatment.  Given 125 mg of Solu-Medrol along with 2 g of IV magnesium.  Patient continued to wheeze.  [facility] hospitalist contacted for admission.

## 2021-12-30 NOTE — ED Provider Notes (Signed)
[facility]-EMERGENCY DEPT Provider Note   CSN: [id number] Arrival date & time: 12/29/21  2252     History  Chief Complaint  Patient presents with   Asthma    Keith Henry is a 46 y.o. male Pt complains of cough, wheezing, and asthma exacerbation over the last 3 to 4 days.  Patient has a rescue inhaler, and nebulizer at home, ran out of his rescue inhaler but has been using his nebulizer 2-3 times daily.  He denies any fever, chills.  He does report some central chest pain along with the shortness of breath.  He denies nausea, vomiting    Asthma       Home Medications Prior to Admission medications   Medication Sig Start Date End Date Taking? Authorizing Provider  albuterol (PROVENTIL) (2.5 MG/3ML) 0.083% nebulizer solution Inhale 3 mLs (2.5 mg total) by nebulization every 6 (six) hours as needed for wheezing or shortness of breath. 11/14/21   Lewie Chamber, MD  albuterol (VENTOLIN HFA) 108 (90 Base) MCG/ACT inhaler Inhale 2 puffs into the lungs every 4 (four) hours as needed for wheezing or shortness of breath. 11/14/21 12/14/21  Lewie Chamber, MD  fluticasone-salmeterol (ADVAIR) 250-50 MCG/ACT AEPB Inhale 1 puff into the lungs in the morning and at bedtime. 11/14/21   Lewie Chamber, MD      Allergies    Other and Pork-derived products    Review of Systems   Review of Systems  All other systems reviewed and are negative.   Physical Exam Updated Vital Signs BP 132/86   Pulse 70   Temp 98.2 F (36.8 C) (Oral)   Resp 20   Ht 5\' 9"  (1.753 m)   Wt 94.8 kg   SpO2 98%   BMI 30.86 kg/m  Physical Exam Vitals and nursing note reviewed.  Constitutional:      General: He is not in acute distress.    Appearance: Normal appearance.  HENT:     Head: Normocephalic and atraumatic.  Eyes:     General:        Right eye: No discharge.        Left eye: No discharge.  Cardiovascular:     Rate and Rhythm: Normal rate and regular rhythm.     Heart  sounds: No murmur heard.    No friction rub. No gallop.  Pulmonary:     Effort: Pulmonary effort is normal.     Breath sounds: Normal breath sounds.     Comments: Biphasic bilateral wheezing, increased respiratory rate Abdominal:     General: Bowel sounds are normal.     Palpations: Abdomen is soft.  Skin:    General: Skin is warm and dry.     Capillary Refill: Capillary refill takes less than 2 seconds.  Neurological:     Mental Status: He is alert and oriented to person, place, and time.  Psychiatric:        Mood and Affect: Mood normal.        Behavior: Behavior normal.     ED Results / Procedures / Treatments   Labs (all labs ordered are listed, but only abnormal results are displayed) Labs Reviewed  CBC - Abnormal; Notable for the following components:      Result Value   WBC 12.3 (*)    RBC 4.19 (*)    All other components within normal limits  BASIC METABOLIC PANEL - Abnormal; Notable for the following components:   Glucose, Bld 106 (*)  Calcium 8.7 (*)    All other components within normal limits  BLOOD GAS, VENOUS - Abnormal; Notable for the following components:   pO2, Ven 61 (*)    Bicarbonate 29.7 (*)    Acid-Base Excess 3.7 (*)    All other components within normal limits  RESP PANEL BY RT-PCR (FLU A&B, COVID) ARPGX2  NICOTINE SCREEN, URINE  TROPONIN I (HIGH SENSITIVITY)  TROPONIN I (HIGH SENSITIVITY)    EKG EKG Interpretation  Date/Time:  Sunday December 29 2021 23:22:16 EST Ventricular Rate:  93 PR Interval:  145 QRS Duration: 81 QT Interval:  311 QTC Calculation: 387 R Axis:   69 Text Interpretation: Sinus rhythm RSR' in V1 or V2, right VCD or RVH Nonspecific T abnormalities, inferior leads No significant change since 12/07/2021 Confirmed by Geoffery Lyons ([id number]) on 12/30/2021 12:46:59 AM  Radiology DG Chest 2 View  Result Date: 12/30/2021 CLINICAL DATA:  Shortness of breath EXAM: CHEST - 2 VIEW COMPARISON:  12/07/2021 FINDINGS: The heart  size and mediastinal contours are within normal limits. Both lungs are clear. The visualized skeletal structures are unremarkable. IMPRESSION: No active cardiopulmonary disease. Electronically Signed   By: Alcide Clever M.D.   On: 12/30/2021 00:13    Procedures .Critical Care  Performed by: Olene Floss, PA-C Authorized by: Olene Floss, PA-C   Critical care provider statement:    Critical care time (minutes):  54   Critical care was necessary to treat or prevent imminent or life-threatening deterioration of the following conditions:  Respiratory failure   Critical care was time spent personally by me on the following activities:  Development of treatment plan with patient or surrogate, discussions with consultants, evaluation of patient's response to treatment, examination of patient, ordering and review of laboratory studies, ordering and review of radiographic studies, ordering and performing treatments and interventions, pulse oximetry, re-evaluation of patient's condition and review of old charts   Care discussed with: admitting provider       Medications Ordered in ED Medications  albuterol (PROVENTIL) (2.5 MG/3ML) 0.083% nebulizer solution (  Not Given 12/30/21 0334)  acetaminophen (TYLENOL) tablet 650 mg (has no administration in time range)    Or  acetaminophen (TYLENOL) suppository 650 mg (has no administration in time range)  ondansetron (ZOFRAN) tablet 4 mg (has no administration in time range)    Or  ondansetron (ZOFRAN) injection 4 mg (has no administration in time range)  ipratropium-albuterol (DUONEB) 0.5-2.5 (3) MG/3ML nebulizer solution 3 mL (3 mLs Nebulization Not Given 12/30/21 0353)  predniSONE (DELTASONE) tablet 40 mg (has no administration in time range)  montelukast (SINGULAIR) tablet 10 mg (has no administration in time range)  ipratropium-albuterol (DUONEB) 0.5-2.5 (3) MG/3ML nebulizer solution 3 mL (3 mLs Nebulization Given 12/30/21 0042)   albuterol (PROVENTIL) (2.5 MG/3ML) 0.083% nebulizer solution (2.5 mg  Given 12/30/21 0216)  methylPREDNISolone sodium succinate (SOLU-MEDROL) 125 mg/2 mL injection (  Given 12/30/21 0042)  ondansetron (ZOFRAN) injection 4 mg (4 mg Intravenous Given 12/30/21 0042)  magnesium sulfate IVPB 2 g 50 mL (0 g Intravenous Stopped 12/30/21 0238)  ipratropium-albuterol (DUONEB) 0.5-2.5 (3) MG/3ML nebulizer solution 3 mL (3 mLs Nebulization Given 12/30/21 0110)  sodium chloride 0.9 % bolus 1,000 mL (1,000 mLs Intravenous New Bag/Given 12/30/21 0113)  acetaminophen (TYLENOL) tablet 650 mg (650 mg Oral Given 12/30/21 0111)  albuterol (PROVENTIL) (2.5 MG/3ML) 0.083% nebulizer solution (10 mg/hr Nebulization Given 12/30/21 0324)    ED Course/ Medical Decision Making/ A&P  Medical Decision Making Amount and/or Complexity of Data Reviewed Labs: ordered. Radiology: ordered.  Risk OTC drugs. Prescription drug management. Decision regarding hospitalization.   This patient is a 46 y.o. male who presents to the ED for concern of shob, wheezing, this involves an extensive number of treatment options, and is a complaint that carries with it a high risk of complications and morbidity. The emergent differential diagnosis prior to evaluation includes, but is not limited to,  asthma exacerbation, COPD exacerbation, acute upper respiratory infection, acute bronchitis, chronic bronchitis, interstitial lung disease, ARDS, PE, pneumonia, atypical ACS, carbon monoxide poisoning, spontaneous pneumothorax versus other .   This is not an exhaustive differential.   Past Medical History / Co-morbidities / Social History: Asthma, tobacco use  Additional history: Chart reviewed. Pertinent results include: Reviewed lab work, imaging from previous emergency department visits and hospitalizations  Physical Exam: Physical exam performed. The pertinent findings include: Patient with bilateral  biphasic wheezing, increased respiratory rate, some respiratory distress.  He is overall stable oxygen saturation on room air although had 1 brief period of hypoxia prior to initial DuoNeb  Lab Tests: I ordered, and personally interpreted labs.  The pertinent results include: BMP overall unremarkable, CBC with mild leukocytosis, white blood cells 12.3.  VBG shows mild increase bicarb suggestive of mild hypercarbia, no acidosis noted.  His troponin is negative x 1 in context of no active chest pain, his RVP is negative for COVID, flu.   Imaging Studies: I ordered imaging studies including plain film chest x-ray. I independently visualized and interpreted imaging which showed no acute intrathoracic abnormality. I agree with the radiologist interpretation.   Cardiac Monitoring:  The patient was maintained on a cardiac monitor.  My attending physician Dr. Judd Lien viewed and interpreted the cardiac monitored which showed an underlying rhythm of: NSR, nonspecific T wave abnormalities, no clear ischemia. I agree with this interpretation.   Medications: I ordered medication including DuoNeb x 2, fluid bolus, continuous neb, Zofran, Solu-Medrol, mag for asthma exacerbation, respiratory distress.  After second DuoNeb, and patient having done nebulizer at home patient with no significant improvement of wheezing, still having increased work of breathing, and biphasic wheezing bilaterally.  We will begin continuous neb and speak to the hospitalist regarding admission.  Consultations Obtained: I requested consultation with the hospitalist, spoke with Dr. Imogene Burn,  and discussed lab and imaging findings as well as pertinent plan - they recommend: Admission   Disposition: After consideration of the diagnostic results and the patients response to treatment, I feel that patient would benefit from admission as discussed as above .   I discussed this case with my attending physician Dr. Judd Lien who cosigned this note  including patient's presenting symptoms, physical exam, and planned diagnostics and interventions. Attending physician stated agreement with plan or made changes to plan which were implemented.    Final Clinical Impression(s) / ED Diagnoses Final diagnoses:  Moderate persistent asthma with exacerbation    Rx / DC Orders ED Discharge Orders     None         Olene Floss, PA-C 12/30/21 0402    Geoffery Lyons, MD 12/30/21 [phone]

## 2021-12-30 NOTE — ED Notes (Signed)
ED TO INPATIENT HANDOFF REPORT  Name/Age/Gender Keith Henry 46 y.o. male  Code Status    Code Status Orders  (From admission, onward)           Start     Ordered   12/30/21 0346  Full code  Continuous        12/30/21 0345           Code Status History     Date Active Date Inactive Code Status Order ID Comments User Context   12/30/2021 0312 12/30/2021 0345 Full Code [id number]  Carollee Herter, DO ED   11/12/2021 0659 11/14/2021 2119 Full Code [id number]  Angie Fava, DO ED   02/08/2021 0558 02/13/2021 1904 Full Code [id number]  Eduard Clos, MD ED       Home/SNF/Other Home  Chief Complaint Acute asthma exacerbation [J45.901]  Level of Care/Admitting Diagnosis ED Disposition     ED Disposition  Admit   Condition  --   Comment  Hospital Area: [facility] [[id number]]  Level of Care: Progressive [102]  Admit to Progressive based on following criteria: RESPIRATORY PROBLEMS hypoxemic/hypercapnic respiratory failure that is responsive to NIPPV (BiPAP) or High Flow Nasal Cannula (6-80 lpm). Frequent assessment/intervention, no > Q2 hrs < Q4 hrs, to maintain oxygenation and pulmonary hygiene.  May place patient in observation at [facility] or [facility] if equivalent level of care is available:: No  Covid Evaluation: Asymptomatic - no recent exposure (last 10 days) testing not required  Diagnosis: Acute asthma exacerbation [003704]  Admitting Physician: Imogene Burn, ERIC [[id number]]  Attending Physician: Imogene Burn, ERIC [[id number]]          Medical History Past Medical History:  Diagnosis Date   Asthma     Allergies Allergies  Allergen Reactions   Other Anaphylaxis    Pork   Pork-Derived Products     IV Location/Drains/Wounds Patient Lines/Drains/Airways Status     Active Line/Drains/Airways     Name Placement date Placement time Site Days   Peripheral IV 12/30/21 20 G Left;Posterior Hand 12/30/21  0030  Hand  less than 1             Labs/Imaging Results for orders placed or performed during the hospital encounter of 12/29/21 (from the past 48 hour(s))  Blood gas, venous (at [facility] and [facility], not at [facility])     Status: Abnormal   Collection Time: 12/29/21 11:31 PM  Result Value Ref Range   pH, Ven 7.39 7.25 - 7.43   pCO2, Ven 49 44 - 60 mmHg   pO2, Ven 61 (H) 32 - 45 mmHg   Bicarbonate 29.7 (H) 20.0 - 28.0 mmol/L   Acid-Base Excess 3.7 (H) 0.0 - 2.0 mmol/L   O2 Saturation 92.3 %   Patient temperature 37.0     Comment: Performed at [facility], [address] [place], [place] [address]  CBC     Status: Abnormal   Collection Time: 12/29/21 11:46 PM  Result Value Ref Range   WBC 12.3 (H) 4.0 - 10.5 K/uL   RBC 4.19 (L) 4.22 - 5.81 MIL/uL   Hemoglobin 13.5 13.0 - 17.0 g/dL   HCT 69.4 50.3 - 88.8 %   MCV 98.8 80.0 - 100.0 fL   MCH 32.2 26.0 - 34.0 pg   MCHC 32.6 30.0 - 36.0 g/dL   RDW 28.0 03.4 - 91.7 %   Platelets 338 150 - 400 K/uL   nRBC 0.0 0.0 - 0.2 %  Comment: Performed at [facility], [address] [place], [place] [address]  Basic metabolic panel     Status: Abnormal   Collection Time: 12/29/21 11:46 PM  Result Value Ref Range   Sodium 141 135 - 145 mmol/L   Potassium 4.2 3.5 - 5.1 mmol/L   Chloride 109 98 - 111 mmol/L   CO2 23 22 - 32 mmol/L   Glucose, Bld 106 (H) 70 - 99 mg/dL    Comment: Glucose reference range applies only to samples taken after fasting for at least 8 hours.   BUN 17 6 - 20 mg/dL   Creatinine, Ser 4.09 0.61 - 1.24 mg/dL   Calcium 8.7 (L) 8.9 - 10.3 mg/dL   GFR, Estimated >73 >53 mL/min    Comment: (NOTE) Calculated using the CKD-EPI Creatinine Equation (2021)    Anion gap 9 5 - 15    Comment: Performed at [facility], [address], [place], [place] [address]  Troponin I (High Sensitivity)     Status: None   Collection Time: 12/29/21 11:46 PM  Result Value Ref Range   Troponin I (High Sensitivity) 2 <18 ng/L     Comment: (NOTE) Elevated high sensitivity troponin I (hsTnI) values and significant  changes across serial measurements may suggest ACS but many other  chronic and acute conditions are known to elevate hsTnI results.  Refer to the "Links" section for chest pain algorithms and additional  guidance. Performed at [facility], [address] [place], [place] [address]   Resp Panel by RT-PCR (Flu A&B, Covid) Anterior Nasal Swab     Status: None   Collection Time: 12/30/21 12:50 AM   Specimen: Anterior Nasal Swab  Result Value Ref Range   SARS Coronavirus 2 by RT PCR NEGATIVE NEGATIVE    Comment: (NOTE) SARS-CoV-2 target nucleic acids are NOT DETECTED.  The SARS-CoV-2 RNA is generally detectable in upper respiratory specimens during the acute phase of infection. The lowest concentration of SARS-CoV-2 viral copies this assay can detect is 138 copies/mL. A negative result does not preclude SARS-Cov-2 infection and should not be used as the sole basis for treatment or other patient management decisions. A negative result may occur with  improper specimen collection/handling, submission of specimen other than nasopharyngeal swab, presence of viral mutation(s) within the areas targeted by this assay, and inadequate number of viral copies(<138 copies/mL). A negative result must be combined with clinical observations, patient history, and epidemiological information. The expected result is Negative.  Fact Sheet for Patients:  BloggerCourse.com  Fact Sheet for Healthcare Providers:  SeriousBroker.it  This test is no t yet approved or cleared by the [place] FDA and  has been authorized for detection and/or diagnosis of SARS-CoV-2 by FDA under an Emergency Use Authorization (EUA). This EUA will remain  in effect (meaning this test can be used) for the duration of the COVID-19 declaration under Section 564(b)(1) of the  Act, 21 U.S.C.section 360bbb-3(b)(1), unless the authorization is terminated  or revoked sooner.       Influenza A by PCR NEGATIVE NEGATIVE   Influenza B by PCR NEGATIVE NEGATIVE    Comment: (NOTE) The Xpert Xpress SARS-CoV-2/FLU/RSV plus assay is intended as an aid in the diagnosis of influenza from Nasopharyngeal swab specimens and should not be used as a sole basis for treatment. Nasal washings and aspirates are unacceptable for Xpert Xpress SARS-CoV-2/FLU/RSV testing.  Fact Sheet for Patients: BloggerCourse.com  Fact Sheet for Healthcare Providers: SeriousBroker.it  This test is not yet approved or cleared by the [place] and has been authorized for detection and/or diagnosis of SARS-CoV-2 by FDA under an Emergency Use Authorization (EUA). This EUA will remain in effect (meaning this test can be used) for the duration of the COVID-19 declaration under Section 564(b)(1) of the Act, 21 U.S.C. section 360bbb-3(b)(1), unless the authorization is terminated or revoked.  Performed at [facility], [address], [place], [place] [address]    DG Chest 2 View  Result Date: 12/30/2021 CLINICAL DATA:  Shortness of breath EXAM: CHEST - 2 VIEW COMPARISON:  12/07/2021 FINDINGS: The heart size and mediastinal contours are within normal limits. Both lungs are clear. The visualized skeletal structures are unremarkable. IMPRESSION: No active cardiopulmonary disease. Electronically Signed   By: Alcide Clever M.D.   On: 12/30/2021 00:13    Pending Labs Unresulted Labs (From admission, onward)     Start     Ordered   12/30/21 1028  Respiratory (~20 pathogens) panel by PCR  (Respiratory panel by PCR (~20 pathogens, ~24 hr TAT)  w precautions)  Once,   R        12/30/21 1028   12/30/21 0317  Nicotine screen, urine  Once,   R        12/30/21 0316            Vitals/Pain Today's Vitals   12/30/21 1348  12/30/21 1445 12/30/21 1730 12/30/21 1800  BP: (!) 154/88 (!) 182/131 (!) 179/133   Pulse: (!) 101 90 87   Resp: (!) 25 20 20    Temp: 98 F (36.7 C)   98 F (36.7 C)  TempSrc: Oral     SpO2: 96% 96% 93%   Weight:      Height:      PainSc:        Isolation Precautions Droplet precaution  Medications Medications  albuterol (PROVENTIL) (2.5 MG/3ML) 0.083% nebulizer solution (  Not Given 12/30/21 0334)  acetaminophen (TYLENOL) tablet 650 mg (has no administration in time range)    Or  acetaminophen (TYLENOL) suppository 650 mg (has no administration in time range)  ondansetron (ZOFRAN) tablet 4 mg (has no administration in time range)    Or  ondansetron (ZOFRAN) injection 4 mg (has no administration in time range)  ipratropium-albuterol (DUONEB) 0.5-2.5 (3) MG/3ML nebulizer solution 3 mL (3 mLs Nebulization Given 12/30/21 1600)  predniSONE (DELTASONE) tablet 40 mg (40 mg Oral Given 12/30/21 0730)  montelukast (SINGULAIR) tablet 10 mg (has no administration in time range)  albuterol (PROVENTIL) (2.5 MG/3ML) 0.083% nebulizer solution 2.5 mg (has no administration in time range)  ipratropium-albuterol (DUONEB) 0.5-2.5 (3) MG/3ML nebulizer solution 3 mL (3 mLs Nebulization Given 12/30/21 0042)  albuterol (PROVENTIL) (2.5 MG/3ML) 0.083% nebulizer solution (2.5 mg  Given 12/30/21 0216)  methylPREDNISolone sodium succinate (SOLU-MEDROL) 125 mg/2 mL injection (  Given 12/30/21 0042)  ondansetron (ZOFRAN) injection 4 mg (4 mg Intravenous Given 12/30/21 0042)  magnesium sulfate IVPB 2 g 50 mL (0 g Intravenous Stopped 12/30/21 0238)  ipratropium-albuterol (DUONEB) 0.5-2.5 (3) MG/3ML nebulizer solution 3 mL (3 mLs Nebulization Given 12/30/21 0110)  sodium chloride 0.9 % bolus 1,000 mL (0 mLs Intravenous Stopped 12/30/21 0707)  acetaminophen (TYLENOL) tablet 650 mg (650 mg Oral Given 12/30/21 0111)  albuterol (PROVENTIL) (2.5 MG/3ML) 0.083% nebulizer solution (10 mg/hr Nebulization Given  12/30/21 0324)    Mobility walks

## 2021-12-30 NOTE — H&P (Signed)
History and Physical    Keith Henry IWP:[id number] DOB: 1975-03-21 DOA: 12/29/2021  DOS: the patient was seen and examined on 12/29/2021  PCP: Patient, No Pcp Per   Patient coming from: Home  I have personally briefly reviewed patient's old medical records in [place] [facility]  CC: SOB, wheezing HPI: 46 yo AAM with hx of uncontrolled asthma, tobacco abuse, presents to ER with 1 week hx of SOB, cough, wheezing. Pt claims he quit smoking. Declines to say how long it's been without cigarettes. Pt with hx of non-compliance with picking up his discharge medications. No fever or chills. States he is out of his albuterol rescue inhaler. He has not yet found a PCP or made an appointment to see a PCP despite numerous recommendations to find a PCP.  Presented to ER with SOB and wheezing.  Arrival temp 98.0 heart rate 82 blood pressure 157/86 satting 96% on room air.  Patient received 2 albuterol nebulizer treatments and was started on a continuous nebulizer treatment.  Given 125 mg of Solu-Medrol along with 2 g of IV magnesium.  Patient continued to wheeze.  [facility] hospitalist contacted for admission.   ED Course: wheezing on arrival. Received IV solumedrol, IV magnesium, 2 albuterol nebs and 1 continuous neb treatment.  Review of Systems:  Review of Systems  Constitutional: Negative.  Negative for chills and fever.  HENT: Negative.    Eyes: Negative.   Respiratory:  Positive for cough, shortness of breath and wheezing.   Cardiovascular: Negative.   Gastrointestinal: Negative.   Genitourinary: Negative.   Musculoskeletal: Negative.   Skin: Negative.   Neurological: Negative.   Endo/Heme/Allergies: Negative.   Psychiatric/Behavioral: Negative.    All other systems reviewed and are negative.   Past Medical History:  Diagnosis Date   Asthma     Past Surgical History:  Procedure Laterality Date   ANKLE SURGERY     SKIN GRAFT     SKULL FRACTURE ELEVATION     pt unsure of  specifics     reports that he quit smoking about 6 months ago. His smoking use included cigarettes. He smoked an average of 2 packs per day. He has never used smokeless tobacco. He reports current alcohol use of about 2.0 standard drinks of alcohol per week. He reports current drug use. Drug: Marijuana.  Allergies  Allergen Reactions   Other Anaphylaxis    Pork   Pork-Derived Products    Solu-Medrol [Methylprednisolone]     Family History  Problem Relation Age of Onset   Hypertension Other     Prior to Admission medications   Medication Sig Start Date End Date Taking? Authorizing Provider  albuterol (PROVENTIL) (2.5 MG/3ML) 0.083% nebulizer solution Inhale 3 mLs (2.5 mg total) by nebulization every 6 (six) hours as needed for wheezing or shortness of breath. 11/14/21   Lewie Chamber, MD  albuterol (VENTOLIN HFA) 108 (90 Base) MCG/ACT inhaler Inhale 2 puffs into the lungs every 4 (four) hours as needed for wheezing or shortness of breath. 11/14/21 12/14/21  Lewie Chamber, MD  fluticasone-salmeterol (ADVAIR) 250-50 MCG/ACT AEPB Inhale 1 puff into the lungs in the morning and at bedtime. 11/14/21   Lewie Chamber, MD  predniSONE (DELTASONE) 20 MG tablet 2 tabs po daily x 4 days 12/08/21   Melene Plan, DO    Physical Exam: Vitals:   12/29/21 2315 12/29/21 2316 12/30/21 0245  BP:  (!) 157/86 132/86  Pulse:  92 70  Resp:  (!) 22 20  Temp:  98  F (36.7 C)   TempSrc:  Oral   SpO2: 95% 96% 99%  Weight:  94.8 kg   Height:  5\' 9"  (1.753 m)     Physical Exam Vitals and nursing note reviewed.  Constitutional:      General: He is not in acute distress.    Appearance: Normal appearance. He is not ill-appearing, toxic-appearing or diaphoretic.  HENT:     Head: Normocephalic and atraumatic.     Nose: Nose normal.  Cardiovascular:     Rate and Rhythm: Normal rate and regular rhythm.     Pulses: Normal pulses.  Pulmonary:     Effort: No respiratory distress.     Breath sounds:  Examination of the right-upper field reveals wheezing. Examination of the left-upper field reveals wheezing. Examination of the right-middle field reveals wheezing. Examination of the left-middle field reveals wheezing. Examination of the right-lower field reveals wheezing. Examination of the left-lower field reveals wheezing. Wheezing present.  Abdominal:     General: Abdomen is flat. Bowel sounds are normal. There is no distension.     Tenderness: There is no abdominal tenderness. There is no guarding.  Musculoskeletal:     Right lower leg: No edema.     Left lower leg: No edema.  Skin:    General: Skin is warm and dry.     Capillary Refill: Capillary refill takes less than 2 seconds.  Neurological:     General: No focal deficit present.     Mental Status: He is alert and oriented to person, place, and time.      Labs on Admission: I have personally reviewed following labs and imaging studies  CBC: Recent Labs  Lab 12/29/21 2346  WBC 12.3*  HGB 13.5  HCT 41.4  MCV 98.8  PLT 338   Basic Metabolic Panel: Recent Labs  Lab 12/29/21 2346  NA 141  K 4.2  CL 109  CO2 23  GLUCOSE 106*  BUN 17  CREATININE 0.96  CALCIUM 8.7*   GFR: Estimated Creatinine Clearance: 108 mL/min (by C-G formula based on SCr of 0.96 mg/dL). Liver Function Tests: No results for input(s): "AST", "ALT", "ALKPHOS", "BILITOT", "PROT", "ALBUMIN" in the last 168 hours. No results for input(s): "LIPASE", "AMYLASE" in the last 168 hours. No results for input(s): "AMMONIA" in the last 168 hours. Coagulation Profile: No results for input(s): "INR", "PROTIME" in the last 168 hours. Cardiac Enzymes: Recent Labs  Lab 12/29/21 2346  TROPONINIHS 2   BNP (last 3 results) No results for input(s): "PROBNP" in the last 8760 hours. HbA1C: No results for input(s): "HGBA1C" in the last 72 hours. CBG: No results for input(s): "GLUCAP" in the last 168 hours. Lipid Profile: No results for input(s): "CHOL",  "HDL", "LDLCALC", "TRIG", "CHOLHDL", "LDLDIRECT" in the last 72 hours. Thyroid Function Tests: No results for input(s): "TSH", "T4TOTAL", "FREET4", "T3FREE", "THYROIDAB" in the last 72 hours. Anemia Panel: No results for input(s): "VITAMINB12", "FOLATE", "FERRITIN", "TIBC", "IRON", "RETICCTPCT" in the last 72 hours. Urine analysis:    Component Value Date/Time   COLORURINE YELLOW 02/08/2021 0248   APPEARANCEUR HAZY (A) 02/08/2021 0248   LABSPEC 1.024 02/08/2021 0248   PHURINE 5.0 02/08/2021 0248   GLUCOSEU NEGATIVE 02/08/2021 0248   HGBUR NEGATIVE 02/08/2021 0248   BILIRUBINUR NEGATIVE 02/08/2021 0248   KETONESUR NEGATIVE 02/08/2021 0248   PROTEINUR NEGATIVE 02/08/2021 0248   NITRITE NEGATIVE 02/08/2021 0248   LEUKOCYTESUR TRACE (A) 02/08/2021 0248    Radiological Exams on Admission: I have personally reviewed images  DG Chest 2 View  Result Date: 12/30/2021 CLINICAL DATA:  Shortness of breath EXAM: CHEST - 2 VIEW COMPARISON:  12/07/2021 FINDINGS: The heart size and mediastinal contours are within normal limits. Both lungs are clear. The visualized skeletal structures are unremarkable. IMPRESSION: No active cardiopulmonary disease. Electronically Signed   By: Alcide Clever M.D.   On: 12/30/2021 00:13    EKG: My personal interpretation of EKG shows: NSR    Assessment/Plan Principal Problem:   Acute asthma exacerbation Active Problems:   Tobacco use   Assessment and Plan: * Acute asthma exacerbation Observation telemetry bed. Continue with duonebs q4h. Continue with prednisone. Pt has not yet established with PCP despite numerous recommendations to get PCP.  Tobacco use Pt states he stopped smoking. I have concerns he is not being truthful. Check urine nicotine.   DVT prophylaxis: SQ Heparin Code Status: Full Code Family Communication: no family at bedside  Disposition Plan: return home  Consults called: none  Admission status: Observation, Telemetry bed   Carollee Herter,  DO [facility] 12/30/2021, 3:26 AM

## 2021-12-30 NOTE — Progress Notes (Signed)
   Patient seen and examined at bedside, patient admitted after midnight, please see earlier detailed admission note by Carollee Herter, DO. Briefly, patient presented secondary to dyspnea and wheezing and found to have an asthma exacerbation.  Subjective: Continued wheezing and dyspnea.  BP (!) 154/88 (BP Location: Right Arm)   Pulse (!) 101   Temp 98 F (36.7 C) (Oral)   Resp (!) 25   Ht 5\' 9"  (1.753 m)   Wt 94.8 kg   SpO2 96%   BMI 30.86 kg/m   General exam: Appears calm and comfortable Respiratory system: Diminished with diffuse wheezing. Respiratory effort normal. Cardiovascular system: S1 & S2 heard, RRR. No murmurs, rubs, gallops or clicks. Gastrointestinal system: Abdomen is nondistended, soft and nontender. No organomegaly or masses felt. Normal bowel sounds heard. Central nervous system: Alert and oriented. No focal neurological deficits. Musculoskeletal: No edema. No calf tenderness Skin: No cyanosis. No rashes Psychiatry: Judgement and insight appear normal. Mood & affect appropriate.   Brief assessment/Plan:  Assessment and Plan:  * Acute asthma exacerbation Continued dyspnea/wheezing.  -Continue Duonebs q4 hours and albuterol PRN -Continue prednisone -Ambulatory pulse ox  Tobacco use Noted to have stopped tobacco use, per report.   Family communication: None at bedside DVT prophylaxis: SCDs Disposition: Home likely in 1 day if functionally close to baseline  , MD [facility] 12/30/2021, 2:50 PM

## 2021-12-30 NOTE — Assessment & Plan Note (Addendum)
Noted to have stopped tobacco use, per report.

## 2021-12-30 NOTE — Assessment & Plan Note (Addendum)
Continued dyspnea/wheezing.  -Continue Duonebs q4 hours and albuterol PRN -Continue prednisone -Ambulatory pulse ox

## 2021-12-31 DIAGNOSIS — Z720 Tobacco use: Secondary | ICD-10-CM | POA: Diagnosis not present

## 2021-12-31 DIAGNOSIS — J45901 Unspecified asthma with (acute) exacerbation: Secondary | ICD-10-CM | POA: Diagnosis not present

## 2021-12-31 LAB — RESPIRATORY PANEL BY PCR

## 2021-12-31 LAB — TROPONIN I (HIGH SENSITIVITY): Troponin I (High Sensitivity): 2 ng/L (ref <18)

## 2021-12-31 MED ORDER — METHYLPREDNISOLONE SODIUM SUCC 40 MG IJ SOLR
40.0000 mg | Freq: Every day | INTRAMUSCULAR | Status: DC
  Administered 2021-12-31 – 2022-01-02 (×3): 40 mg via INTRAVENOUS
  Filled 2021-12-31 (×3): qty 1

## 2021-12-31 NOTE — Hospital Course (Signed)
Keith Henry is a 46 y.o. male with a history of asthma and tobacco use. Patient presented secondary to wheezing/dyspnea and diagnosed with an asthma exacerbation. Steroids and albuterol/Duoneb therapy started. Patient not currently near baseline functional capacity.

## 2021-12-31 NOTE — Progress Notes (Signed)
Mobility Specialist - Progress Note   12/31/21 1541  Mobility  Activity Ambulated independently in hallway  Level of Assistance Independent  Assistive Device None  Distance Ambulated (ft) 500 ft  Activity Response Tolerated well  Mobility Referral Yes  $Mobility charge 1 Mobility   Pt received in bed and agreeable to mobility. Pt had some audible wheezing during ambulation, No complaints during ambulation. Pt to bed after session with all needs met.     Post-mobility: 98% SPO2  Set designer

## 2021-12-31 NOTE — Progress Notes (Signed)
PROGRESS NOTE    Keith Henry  KZS:[id number] DOB: 05/03/1975 DOA: 12/29/2021 PCP: Patient, No Pcp Per   Brief Narrative: Keith Henry is a 46 y.o. male with a history of asthma and tobacco use. Patient presented secondary to wheezing/dyspnea and diagnosed with an asthma exacerbation. Steroids and albuterol/Duoneb therapy started. Patient not currently near baseline functional capacity.   Assessment and Plan: * Acute asthma exacerbation Continued dyspnea/wheezing which is worse than yesterday. -Continue Duonebs q4 hours and albuterol PRN -Transition back to Solumedrol -Ambulatory pulse ox  Tobacco use Noted to have stopped tobacco use, per report.    DVT prophylaxis: SCDs Code Status:   Code Status: Full Code Family Communication: None at bedside Disposition Plan: Discharge home likely in 1-2 days if functionally back to baseline   Consultants:  None  Procedures:  None  Antimicrobials: None    Subjective: Patient reports continued wheezing. Dyspnea. Cough.  Objective: BP 135/89 (BP Location: Right Arm)   Pulse 76   Temp 97.6 F (36.4 C) (Oral)   Resp 16   Ht 5\' 9"  (1.753 m)   Wt 94.7 kg   SpO2 97%   BMI 30.83 kg/m   Examination:  General exam: Appears calm and comfortable. Appears ill. Respiratory system: Moderate-severe diffuse wheezing. Respiratory effort normal. Cardiovascular system: S1 & S2 heard, RRR. No murmurs, rubs, gallops or clicks. Gastrointestinal system: Abdomen is nondistended, soft and nontender. Normal bowel sounds heard. Central nervous system: Alert and oriented. No focal neurological deficits. Musculoskeletal: No edema. No calf tenderness Skin: No cyanosis. No rashes Psychiatry: Judgement and insight appear normal. Mood & affect appropriate.    Data Reviewed: I have personally reviewed following labs and imaging studies  CBC Lab Results  Component Value Date   WBC 12.3 (H) 12/29/2021   RBC 4.19 (L) 12/29/2021   HGB 13.5  12/29/2021   HCT 41.4 12/29/2021   MCV 98.8 12/29/2021   MCH 32.2 12/29/2021   PLT 338 12/29/2021   MCHC 32.6 12/29/2021   RDW 12.6 12/29/2021   LYMPHSABS 2.4 10/21/2021   MONOABS 0.7 10/21/2021   EOSABS 0.7 (H) 10/21/2021   BASOSABS 0.1 10/21/2021     Last metabolic panel Lab Results  Component Value Date   NA 141 12/29/2021   K 4.2 12/29/2021   CL 109 12/29/2021   CO2 23 12/29/2021   BUN 17 12/29/2021   CREATININE 0.96 12/29/2021   GLUCOSE 106 (H) 12/29/2021   GFRNONAA >60 12/29/2021   GFRAA >60 10/05/2019   CALCIUM 8.7 (L) 12/29/2021   PROT 6.2 (L) 11/12/2021   ALBUMIN 3.3 (L) 11/12/2021   BILITOT 1.4 (H) 11/12/2021   ALKPHOS 83 11/12/2021   AST 33 11/12/2021   ALT 13 11/12/2021   ANIONGAP 9 12/29/2021    GFR: Estimated Creatinine Clearance: 108 mL/min (by C-G formula based on SCr of 0.96 mg/dL).  Recent Results (from the past 240 hour(s))  Resp Panel by RT-PCR (Flu A&B, Covid) Anterior Nasal Swab     Status: None   Collection Time: 12/30/21 12:50 AM   Specimen: Anterior Nasal Swab  Result Value Ref Range Status   SARS Coronavirus 2 by RT PCR NEGATIVE NEGATIVE Final    Comment: (NOTE) SARS-CoV-2 target nucleic acids are NOT DETECTED.  The SARS-CoV-2 RNA is generally detectable in upper respiratory specimens during the acute phase of infection. The lowest concentration of SARS-CoV-2 viral copies this assay can detect is 138 copies/mL. A negative result does not preclude SARS-Cov-2 infection and should not be used  as the sole basis for treatment or other patient management decisions. A negative result may occur with  improper specimen collection/handling, submission of specimen other than nasopharyngeal swab, presence of viral mutation(s) within the areas targeted by this assay, and inadequate number of viral copies(<138 copies/mL). A negative result must be combined with clinical observations, patient history, and epidemiological information. The expected  result is Negative.  Fact Sheet for Patients:  BloggerCourse.com  Fact Sheet for Healthcare Providers:  SeriousBroker.it  This test is no t yet approved or cleared by the [place] FDA and  has been authorized for detection and/or diagnosis of SARS-CoV-2 by FDA under an Emergency Use Authorization (EUA). This EUA will remain  in effect (meaning this test can be used) for the duration of the COVID-19 declaration under Section 564(b)(1) of the Act, 21 U.S.C.section 360bbb-3(b)(1), unless the authorization is terminated  or revoked sooner.       Influenza A by PCR NEGATIVE NEGATIVE Final   Influenza B by PCR NEGATIVE NEGATIVE Final    Comment: (NOTE) The Xpert Xpress SARS-CoV-2/FLU/RSV plus assay is intended as an aid in the diagnosis of influenza from Nasopharyngeal swab specimens and should not be used as a sole basis for treatment. Nasal washings and aspirates are unacceptable for Xpert Xpress SARS-CoV-2/FLU/RSV testing.  Fact Sheet for Patients: BloggerCourse.com  Fact Sheet for Healthcare Providers: SeriousBroker.it  This test is not yet approved or cleared by the [place] FDA and has been authorized for detection and/or diagnosis of SARS-CoV-2 by FDA under an Emergency Use Authorization (EUA). This EUA will remain in effect (meaning this test can be used) for the duration of the COVID-19 declaration under Section 564(b)(1) of the Act, 21 U.S.C. section 360bbb-3(b)(1), unless the authorization is terminated or revoked.  Performed at [facility], [address], [place], [place] [address]       Radiology Studies: DG Chest 2 View  Result Date: 12/30/2021 CLINICAL DATA:  Shortness of breath EXAM: CHEST - 2 VIEW COMPARISON:  12/07/2021 FINDINGS: The heart size and mediastinal contours are within normal limits. Both lungs are clear. The  visualized skeletal structures are unremarkable. IMPRESSION: No active cardiopulmonary disease. Electronically Signed   By: Alcide Clever M.D.   On: 12/30/2021 00:13      LOS: 0 days    Jacquelin Hawking, MD [facility] 12/31/2021, 2:53 PM   If 7PM-7AM, please contact night-coverage [email or web address]

## 2022-01-01 DIAGNOSIS — Z87891 Personal history of nicotine dependence: Secondary | ICD-10-CM | POA: Diagnosis not present

## 2022-01-01 DIAGNOSIS — Z8249 Family history of ischemic heart disease and other diseases of the circulatory system: Secondary | ICD-10-CM | POA: Diagnosis not present

## 2022-01-01 DIAGNOSIS — J45909 Unspecified asthma, uncomplicated: Secondary | ICD-10-CM | POA: Diagnosis present

## 2022-01-01 DIAGNOSIS — Z20822 Contact with and (suspected) exposure to covid-19: Secondary | ICD-10-CM | POA: Diagnosis present

## 2022-01-01 DIAGNOSIS — Z91148 Patient's other noncompliance with medication regimen for other reason: Secondary | ICD-10-CM | POA: Diagnosis not present

## 2022-01-01 DIAGNOSIS — J4541 Moderate persistent asthma with (acute) exacerbation: Secondary | ICD-10-CM

## 2022-01-01 LAB — BASIC METABOLIC PANEL
Anion gap: 10 (ref 5–15)
BUN: 21 mg/dL — ABNORMAL HIGH (ref 6–20)
CO2: 24 mmol/L (ref 22–32)
Calcium: 9.2 mg/dL (ref 8.9–10.3)
Chloride: 101 mmol/L (ref 98–111)
Creatinine, Ser: 0.95 mg/dL (ref 0.61–1.24)
GFR, Estimated: >60 mL/min (ref >60)
Glucose, Bld: 118 mg/dL — ABNORMAL HIGH (ref 70–99)
Potassium: 4.5 mmol/L (ref 3.5–5.1)
Sodium: 135 mmol/L (ref 135–145)

## 2022-01-01 LAB — CBC
HCT: 42.3 % (ref 39.0–52.0)
Hemoglobin: 13.8 g/dL (ref 13.0–17.0)
MCH: 32.4 pg (ref 26.0–34.0)
MCHC: 32.6 g/dL (ref 30.0–36.0)
MCV: 99.3 fL (ref 80.0–100.0)
Platelets: 364 10*3/uL (ref 150–400)
RBC: 4.26 MIL/uL (ref 4.22–5.81)
RDW: 12.5 % (ref 11.5–15.5)
WBC: 22.4 10*3/uL — ABNORMAL HIGH (ref 4.0–10.5)
nRBC: 0 % (ref 0.0–0.2)

## 2022-01-01 NOTE — Progress Notes (Signed)
Patient ambulated in hall on RA O2 sat maintained 96% and above.

## 2022-01-01 NOTE — Progress Notes (Signed)
Mobility Specialist - Progress Note   01/01/22 1243  Oxygen Therapy  SpO2 97 %  Mobility  Activity Ambulated independently in hallway  Level of Assistance Independent  Assistive Device None  Distance Ambulated (ft) 500 ft  Activity Response Tolerated well  Mobility Referral Yes  $Mobility charge 1 Mobility   Pt received in bed and agreeable to mobility. No complaints & stated that his wheezing has improved today. Pt to bed after session with all needs met.    During mobility: 97% SpO2   [place]  Mobility Specialist  

## 2022-01-01 NOTE — Progress Notes (Signed)
  Transition of Care [facility]) Screening Note   Patient Details  Name: Keith Henry Date of Birth: Sep 26, 1975   Transition of [facility]) CM/SW Contact:    Lanier Clam, RN Phone Number: 01/01/2022, 12:54 PM    Transition of Care Department [facility]) has reviewed patient and no TOC needs have been identified at this time. We will continue to monitor patient advancement through interdisciplinary progression rounds. If new patient transition needs arise, please place a TOC consult.

## 2022-01-01 NOTE — Progress Notes (Signed)
  Progress Note   Patient: Keith Henry [id number]:[id number] DOB: 24-Apr-1975 DOA: 12/29/2021     0 DOS: the patient was seen and examined on 01/01/2022   Brief hospital course: Keith Henry is a 46 y.o. male with a history of asthma and tobacco use. Patient presented secondary to wheezing/dyspnea and diagnosed with an asthma exacerbation. Steroids and albuterol/Duoneb therapy started. Patient not currently near baseline functional capacity.  Assessment and Plan: * Acute asthma exacerbation -Recently had worsened dyspnea and wheezing despite appropriate O2 sats -Now needing IV steroids -This AM, pt reports feeling better today, but still having wheezing on expiration. On exam, pt with audible wheezing during coughing spell  -Now better able to tolerate ambulating in hall -Continued scheduled nebs and IV steroids -Cont to encourage ambulation as tolerated   Tobacco use Noted to have stopped tobacco use, per report.      Subjective: Reports feeling better today but still wheezing, mostly on expiration, not so much on inspiration anymore  Physical Exam: Vitals:   01/01/22 1142 01/01/22 1243 01/01/22 1338 01/01/22 1523  BP:   130/75   Pulse:   89   Resp:      Temp:   98 F (36.7 C)   TempSrc:   Oral   SpO2: 99% 97% 95% 97%  Weight:      Height:       General exam: Awake, laying in bed, in nad Respiratory system: Normal respiratory effort mostly, but then has audible cough with wheezing spell audible from several feet away Cardiovascular system: regular rate, s1, s2 Gastrointestinal system: Soft, nondistended, positive BS Central nervous system: CN2-12 grossly intact, strength intact Extremities: Perfused, no clubbing Skin: Normal skin turgor, no notable skin lesions seen Psychiatry: Mood normal // no visual hallucinations   Data Reviewed:  Labs reviewed: Na 135, K 4.5, Cr 0.95, WBC 22.4, Hgb 13.8  Family Communication: Pt in room, family not at bedside  Disposition: Status  is: Inpatient Remains inpatient appropriate because: Severity of illness  Planned Discharge Destination: Home    Author: Rickey Barbara, MD 01/01/2022 5:50 PM  For on call review [email or web address].

## 2022-01-02 ENCOUNTER — Other Ambulatory Visit (HOSPITAL_COMMUNITY): Payer: Self-pay

## 2022-01-02 MED ORDER — ALBUTEROL SULFATE (2.5 MG/3ML) 0.083% IN NEBU
2.5000 mg | INHALATION_SOLUTION | Freq: Four times a day (QID) | RESPIRATORY_TRACT | 0 refills | Status: DC | PRN
  Filled 2022-01-02: qty 270, 23d supply, fill #0

## 2022-01-02 MED ORDER — PREDNISONE 10 MG PO TABS
ORAL_TABLET | ORAL | 0 refills | Status: AC
  Filled 2022-01-02: qty 27, 10d supply, fill #0

## 2022-01-02 MED ORDER — FLUTICASONE-SALMETEROL 250-50 MCG/ACT IN AEPB
1.0000 | INHALATION_SPRAY | Freq: Two times a day (BID) | RESPIRATORY_TRACT | 0 refills | Status: DC
  Filled 2022-01-02: qty 60, 30d supply, fill #0

## 2022-01-02 MED ORDER — ALBUTEROL SULFATE HFA 108 (90 BASE) MCG/ACT IN AERS
2.0000 | INHALATION_SPRAY | RESPIRATORY_TRACT | 0 refills | Status: DC | PRN
  Filled 2022-01-02: qty 6.7, 17d supply, fill #0

## 2022-01-02 NOTE — Plan of Care (Signed)
  Problem: Health Behavior/Discharge Planning: Goal: Ability to manage health-related needs will improve Outcome: Progressing   Problem: Clinical Measurements: Goal: Respiratory complications will improve Outcome: Progressing   Problem: Education: Goal: Knowledge of General Education information will improve Description: Including pain rating scale, medication(s)/side effects and non-pharmacologic comfort measures Outcome: Adequate for Discharge   Problem: Clinical Measurements: Goal: Will remain free from infection Outcome: Adequate for Discharge Goal: Diagnostic test results will improve Outcome: Adequate for Discharge Goal: Cardiovascular complication will be avoided Outcome: Adequate for Discharge   Problem: Activity: Goal: Risk for activity intolerance will decrease Outcome: Adequate for Discharge   Problem: Nutrition: Goal: Adequate nutrition will be maintained Outcome: Adequate for Discharge   Problem: Coping: Goal: Level of anxiety will decrease Outcome: Adequate for Discharge   Problem: Elimination: Goal: Will not experience complications related to bowel motility Outcome: Adequate for Discharge Goal: Will not experience complications related to urinary retention Outcome: Adequate for Discharge   Problem: Pain Managment: Goal: General experience of comfort will improve Outcome: Adequate for Discharge   Problem: Safety: Goal: Ability to remain free from injury will improve Outcome: Adequate for Discharge   Problem: Skin Integrity: Goal: Risk for impaired skin integrity will decrease Outcome: Adequate for Discharge

## 2022-01-02 NOTE — Plan of Care (Signed)
  Problem: Education: Goal: Knowledge of General Education information will improve Description: Including pain rating scale, medication(s)/side effects and non-pharmacologic comfort measures 01/02/2022 1454 by Annie Sable, RN Outcome: Completed/Met 01/02/2022 0903 by Annie Sable, RN Outcome: Adequate for Discharge   Problem: Health Behavior/Discharge Planning: Goal: Ability to manage health-related needs will improve 01/02/2022 1454 by Annie Sable, RN Outcome: Completed/Met 01/02/2022 0903 by Annie Sable, RN Outcome: Progressing   Problem: Clinical Measurements: Goal: Ability to maintain clinical measurements within normal limits will improve Outcome: Completed/Met Goal: Will remain free from infection 01/02/2022 1454 by Annie Sable, RN Outcome: Completed/Met 01/02/2022 0903 by Annie Sable, RN Outcome: Adequate for Discharge Goal: Diagnostic test results will improve 01/02/2022 1454 by Annie Sable, RN Outcome: Completed/Met 01/02/2022 0903 by Annie Sable, RN Outcome: Adequate for Discharge Goal: Respiratory complications will improve 01/02/2022 1454 by Annie Sable, RN Outcome: Completed/Met 01/02/2022 0903 by Annie Sable, RN Outcome: Progressing Goal: Cardiovascular complication will be avoided 01/02/2022 1454 by Annie Sable, RN Outcome: Completed/Met 01/02/2022 0903 by Annie Sable, RN Outcome: Adequate for Discharge   Problem: Activity: Goal: Risk for activity intolerance will decrease 01/02/2022 1454 by Annie Sable, RN Outcome: Completed/Met 01/02/2022 0903 by Annie Sable, RN Outcome: Adequate for Discharge   Problem: Nutrition: Goal: Adequate nutrition will be maintained 01/02/2022 1454 by Annie Sable, RN Outcome: Completed/Met 01/02/2022 0903 by Annie Sable, RN Outcome: Adequate for Discharge   Problem: Coping: Goal: Level of anxiety will decrease 01/02/2022 1454 by Annie Sable,  RN Outcome: Completed/Met 01/02/2022 0903 by Annie Sable, RN Outcome: Adequate for Discharge   Problem: Elimination: Goal: Will not experience complications related to bowel motility 01/02/2022 1454 by Annie Sable, RN Outcome: Completed/Met 01/02/2022 0903 by Annie Sable, RN Outcome: Adequate for Discharge Goal: Will not experience complications related to urinary retention 01/02/2022 1454 by Annie Sable, RN Outcome: Completed/Met 01/02/2022 0903 by Annie Sable, RN Outcome: Adequate for Discharge   Problem: Pain Managment: Goal: General experience of comfort will improve 01/02/2022 1454 by Annie Sable, RN Outcome: Completed/Met 01/02/2022 0903 by Annie Sable, RN Outcome: Adequate for Discharge   Problem: Safety: Goal: Ability to remain free from injury will improve 01/02/2022 1454 by Annie Sable, RN Outcome: Completed/Met 01/02/2022 0903 by Annie Sable, RN Outcome: Adequate for Discharge   Problem: Skin Integrity: Goal: Risk for impaired skin integrity will decrease 01/02/2022 1454 by Annie Sable, RN Outcome: Completed/Met 01/02/2022 0903 by Annie Sable, RN Outcome: Adequate for Discharge

## 2022-01-02 NOTE — Progress Notes (Signed)
Mobility Specialist - Progress Note   01/02/22 1021  Mobility  Activity Ambulated independently in hallway  Level of Assistance Independent  Assistive Device None  Distance Ambulated (ft) 500 ft  Range of Motion/Exercises Active  Activity Response Tolerated well  Mobility Referral Yes  $Mobility charge 1 Mobility   Pt received in bed and agreeable to mobility. Pt had some audible wheezing during session but stated he felt fine. No complaints during session. Pt to bed after session with all needs met.    [facility]

## 2022-01-02 NOTE — Discharge Summary (Signed)
Physician Discharge Summary   Patient: Keith Henry MRN: [id number] DOB: [birth year]  Admit date:     12/29/2021  Discharge date:   Discharge Physician: Rickey Barbara   PCP: Patient, No Pcp Per   Recommendations at discharge:    Follow with PCP in 1-2 weeks  Discharge Diagnoses: Principal Problem:   Acute asthma exacerbation Active Problems:   Tobacco use   Asthma  Resolved Problems:   * No resolved hospital problems. *  Hospital Course: Keith Henry is a 46 y.o. male with a history of asthma and tobacco use. Patient presented secondary to wheezing/dyspnea and diagnosed with an asthma exacerbation. Steroids and albuterol/Duoneb therapy started. Patient not currently near baseline functional capacity.  Assessment and Plan: * Acute asthma exacerbation -Recently had worsened dyspnea and wheezing despite appropriate O2 sats -Did ultimately improve with course of IV steroids -Pt was able to ambulate on room air without issues -discharged home with prednisone taper and bronchodilator   Tobacco use Noted to have stopped tobacco use, per report. Cessation done       Consultants:  Procedures performed:   Disposition: Home Diet recommendation:  Regular diet DISCHARGE MEDICATION: Allergies as of 01/02/2022       Reactions   Other Anaphylaxis   Pork   Pork-derived Products         Medication List     TAKE these medications    albuterol (2.5 MG/3ML) 0.083% nebulizer solution Commonly known as: PROVENTIL Inhale 3 mLs (2.5 mg total) by nebulization every 6 (six) hours as needed for wheezing or shortness of breath.   albuterol 108 (90 Base) MCG/ACT inhaler Commonly known as: VENTOLIN HFA Inhale 2 puffs into the lungs every 4 (four) hours as needed for wheezing or shortness of breath.   fluticasone-salmeterol 250-50 MCG/ACT Aepb Commonly known as: ADVAIR Inhale 1 puff into the lungs in the morning and at bedtime.   predniSONE 10 MG tablet Commonly known as:  DELTASONE Take 6 tablets (60 mg total) by mouth daily with breakfast for 2 days, THEN 4 tablets (40 mg total) daily with breakfast for 2 days, THEN 2 tablets (20 mg total) daily with breakfast for 2 days, THEN 1 tablet (10 mg total) daily with breakfast for 2 days, THEN 0.5 tablets (5 mg total) daily with breakfast for 2 days. Start taking on: January 02, 2022        Follow-up Information     Follow up with PCP in 1-2 weeks Follow up.   Why: Hospital follow up               Discharge Exam: Filed Weights   12/29/21 2316 12/31/21 0500  Weight: 94.8 kg 94.7 kg   General exam: Awake, laying in bed, in nad Respiratory system: Normal respiratory effort, trace end-expiratory wheezing Cardiovascular system: regular rate, s1, s2 Gastrointestinal system: Soft, nondistended, positive BS Central nervous system: CN2-12 grossly intact, strength intact Extremities: Perfused, no clubbing Skin: Normal skin turgor, no notable skin lesions seen Psychiatry: Mood normal // no visual hallucinations    Condition at discharge: fair  The results of significant diagnostics from this hospitalization (including imaging, microbiology, ancillary and laboratory) are listed below for reference.   Imaging Studies: DG Chest 2 View  Result Date: 12/30/2021 CLINICAL DATA:  Shortness of breath EXAM: CHEST - 2 VIEW COMPARISON:  12/07/2021 FINDINGS: The heart size and mediastinal contours are within normal limits. Both lungs are clear. The visualized skeletal structures are unremarkable. IMPRESSION: No active cardiopulmonary disease. Electronically Signed  By: Alcide Clever M.D.   On: 12/30/2021 00:13   DG Chest 2 View  Result Date: 12/07/2021 CLINICAL DATA:  Cough, shortness of breath, wheezing EXAM: CHEST - 2 VIEW COMPARISON:  11/12/2021 FINDINGS: The heart size and mediastinal contours are within normal limits. Both lungs are clear. The deformity in right clavicle suggesting malunited old fracture.  IMPRESSION: No active cardiopulmonary disease. Electronically Signed   By: Ernie Avena M.D.   On: 12/07/2021 20:07    Microbiology: Results for orders placed or performed during the hospital encounter of 12/29/21  Resp Panel by RT-PCR (Flu A&B, Covid) Anterior Nasal Swab     Status: None   Collection Time: 12/30/21 12:50 AM   Specimen: Anterior Nasal Swab  Result Value Ref Range Status   SARS Coronavirus 2 by RT PCR NEGATIVE NEGATIVE Final    Comment: (NOTE) SARS-CoV-2 target nucleic acids are NOT DETECTED.  The SARS-CoV-2 RNA is generally detectable in upper respiratory specimens during the acute phase of infection. The lowest concentration of SARS-CoV-2 viral copies this assay can detect is 138 copies/mL. A negative result does not preclude SARS-Cov-2 infection and should not be used as the sole basis for treatment or other patient management decisions. A negative result may occur with  improper specimen collection/handling, submission of specimen other than nasopharyngeal swab, presence of viral mutation(s) within the areas targeted by this assay, and inadequate number of viral copies(<138 copies/mL). A negative result must be combined with clinical observations, patient history, and epidemiological information. The expected result is Negative.  Fact Sheet for Patients:  BloggerCourse.com  Fact Sheet for Healthcare Providers:  SeriousBroker.it  This test is no t yet approved or cleared by the [place] FDA and  has been authorized for detection and/or diagnosis of SARS-CoV-2 by FDA under an Emergency Use Authorization (EUA). This EUA will remain  in effect (meaning this test can be used) for the duration of the COVID-19 declaration under Section 564(b)(1) of the Act, 21 U.S.C.section 360bbb-3(b)(1), unless the authorization is terminated  or revoked sooner.       Influenza A by PCR NEGATIVE NEGATIVE Final    Influenza B by PCR NEGATIVE NEGATIVE Final    Comment: (NOTE) The Xpert Xpress SARS-CoV-2/FLU/RSV plus assay is intended as an aid in the diagnosis of influenza from Nasopharyngeal swab specimens and should not be used as a sole basis for treatment. Nasal washings and aspirates are unacceptable for Xpert Xpress SARS-CoV-2/FLU/RSV testing.  Fact Sheet for Patients: BloggerCourse.com  Fact Sheet for Healthcare Providers: SeriousBroker.it  This test is not yet approved or cleared by the [place] FDA and has been authorized for detection and/or diagnosis of SARS-CoV-2 by FDA under an Emergency Use Authorization (EUA). This EUA will remain in effect (meaning this test can be used) for the duration of the COVID-19 declaration under Section 564(b)(1) of the Act, 21 U.S.C. section 360bbb-3(b)(1), unless the authorization is terminated or revoked.  Performed at [facility], [address] [place], [place] [address]   Respiratory (~20 pathogens) panel by PCR     Status: None   Collection Time: 12/31/21 12:26 PM   Specimen: Nasopharyngeal Swab; Respiratory  Result Value Ref Range Status   Adenovirus NOT DETECTED NOT DETECTED Final   Coronavirus 229E NOT DETECTED NOT DETECTED Final    Comment: (NOTE) The Coronavirus on the Respiratory Panel, DOES NOT test for the novel  Coronavirus (2019 nCoV)    Coronavirus HKU1 NOT DETECTED NOT DETECTED Final   Coronavirus  NL63 NOT DETECTED NOT DETECTED Final   Coronavirus OC43 NOT DETECTED NOT DETECTED Final   Metapneumovirus NOT DETECTED NOT DETECTED Final   Rhinovirus / Enterovirus NOT DETECTED NOT DETECTED Final   Influenza A NOT DETECTED NOT DETECTED Final   Influenza B NOT DETECTED NOT DETECTED Final   Parainfluenza Virus 1 NOT DETECTED NOT DETECTED Final   Parainfluenza Virus 2 NOT DETECTED NOT DETECTED Final   Parainfluenza Virus 3 NOT DETECTED NOT DETECTED Final    Parainfluenza Virus 4 NOT DETECTED NOT DETECTED Final   Respiratory Syncytial Virus NOT DETECTED NOT DETECTED Final   Bordetella pertussis NOT DETECTED NOT DETECTED Final   Bordetella Parapertussis NOT DETECTED NOT DETECTED Final   Chlamydophila pneumoniae NOT DETECTED NOT DETECTED Final   Mycoplasma pneumoniae NOT DETECTED NOT DETECTED Final    Comment: Performed at [facility], [address] [place], [place] [address]    Labs: CBC: Recent Labs  Lab 12/29/21 2346 01/01/22 0526  WBC 12.3* 22.4*  HGB 13.5 13.8  HCT 41.4 42.3  MCV 98.8 99.3  PLT 338 364   Basic Metabolic Panel: Recent Labs  Lab 12/29/21 2346 01/01/22 0526  NA 141 135  K 4.2 4.5  CL 109 101  CO2 23 24  GLUCOSE 106* 118*  BUN 17 21*  CREATININE 0.96 0.95  CALCIUM 8.7* 9.2   Liver Function Tests: No results for input(s): "AST", "ALT", "ALKPHOS", "BILITOT", "PROT", "ALBUMIN" in the last 168 hours. CBG: No results for input(s): "GLUCAP" in the last 168 hours.  Discharge time spent: less than 30 minutes.  Signed: Rickey Barbara, MD [facility] 01/02/2022

## 2022-01-03 ENCOUNTER — Other Ambulatory Visit (HOSPITAL_COMMUNITY): Payer: Self-pay

## 2022-01-08 ENCOUNTER — Emergency Department (HOSPITAL_COMMUNITY): Payer: Commercial Managed Care - HMO

## 2022-01-08 ENCOUNTER — Other Ambulatory Visit: Payer: Self-pay

## 2022-01-08 ENCOUNTER — Observation Stay (HOSPITAL_COMMUNITY)
Admission: EM | Admit: 2022-01-08 | Discharge: 2022-01-10 | Disposition: A | Discharge status: Home / Self Care | Payer: Commercial Managed Care - HMO | Attending: Internal Medicine | Admitting: Internal Medicine

## 2022-01-08 DIAGNOSIS — J4541 Moderate persistent asthma with (acute) exacerbation: Principal | ICD-10-CM | POA: Insufficient documentation

## 2022-01-08 DIAGNOSIS — Z1152 Encounter for screening for COVID-19: Secondary | ICD-10-CM | POA: Insufficient documentation

## 2022-01-08 DIAGNOSIS — Z720 Tobacco use: Secondary | ICD-10-CM | POA: Diagnosis present

## 2022-01-08 DIAGNOSIS — J101 Influenza due to other identified influenza virus with other respiratory manifestations: Secondary | ICD-10-CM | POA: Diagnosis not present

## 2022-01-08 DIAGNOSIS — Z87891 Personal history of nicotine dependence: Secondary | ICD-10-CM | POA: Insufficient documentation

## 2022-01-08 DIAGNOSIS — J45901 Unspecified asthma with (acute) exacerbation: Secondary | ICD-10-CM | POA: Diagnosis present

## 2022-01-08 DIAGNOSIS — Z79899 Other long term (current) drug therapy: Secondary | ICD-10-CM | POA: Diagnosis not present

## 2022-01-08 DIAGNOSIS — J9601 Acute respiratory failure with hypoxia: Secondary | ICD-10-CM | POA: Diagnosis not present

## 2022-01-08 DIAGNOSIS — R0602 Shortness of breath: Secondary | ICD-10-CM | POA: Diagnosis present

## 2022-01-08 LAB — CBC WITH DIFFERENTIAL/PLATELET
Abs Immature Granulocytes: 0.16 10*3/uL — ABNORMAL HIGH (ref 0.00–0.07)
Basophils Absolute: 0.2 10*3/uL — ABNORMAL HIGH (ref 0.0–0.1)
Basophils Relative: 1 %
Eosinophils Absolute: 0.7 10*3/uL — ABNORMAL HIGH (ref 0.0–0.5)
Eosinophils Relative: 4 %
HCT: 40.4 % (ref 39.0–52.0)
Hemoglobin: 13.2 g/dL (ref 13.0–17.0)
Immature Granulocytes: 1 %
Lymphocytes Relative: 15 %
Lymphs Abs: 2.6 10*3/uL (ref 0.7–4.0)
MCH: 32.8 pg (ref 26.0–34.0)
MCHC: 32.7 g/dL (ref 30.0–36.0)
MCV: 100.5 fL — ABNORMAL HIGH (ref 80.0–100.0)
Monocytes Absolute: 1.4 10*3/uL — ABNORMAL HIGH (ref 0.1–1.0)
Monocytes Relative: 8 %
Neutro Abs: 11.9 10*3/uL — ABNORMAL HIGH (ref 1.7–7.7)
Neutrophils Relative %: 71 %
Platelets: 299 10*3/uL (ref 150–400)
RBC: 4.02 MIL/uL — ABNORMAL LOW (ref 4.22–5.81)
RDW: 12.7 % (ref 11.5–15.5)
WBC: 16.9 10*3/uL — ABNORMAL HIGH (ref 4.0–10.5)
nRBC: 0 % (ref 0.0–0.2)

## 2022-01-08 LAB — BASIC METABOLIC PANEL
Anion gap: 7 (ref 5–15)
BUN: 14 mg/dL (ref 6–20)
CO2: 25 mmol/L (ref 22–32)
Calcium: 8.4 mg/dL — ABNORMAL LOW (ref 8.9–10.3)
Chloride: 110 mmol/L (ref 98–111)
Creatinine, Ser: 1 mg/dL (ref 0.61–1.24)
GFR, Estimated: >60 mL/min (ref >60)
Glucose, Bld: 111 mg/dL — ABNORMAL HIGH (ref 70–99)
Potassium: 4.1 mmol/L (ref 3.5–5.1)
Sodium: 142 mmol/L (ref 135–145)

## 2022-01-08 LAB — RESP PANEL BY RT-PCR (FLU A&B, COVID) ARPGX2
Influenza A by PCR: POSITIVE — AB
Influenza B by PCR: NEGATIVE
SARS Coronavirus 2 by RT PCR: NEGATIVE

## 2022-01-08 MED ORDER — ACETAMINOPHEN 650 MG RE SUPP: 650.0000 mg | Freq: Four times a day (QID) | RECTAL | Status: DC | PRN

## 2022-01-08 MED ORDER — ALBUTEROL SULFATE HFA 108 (90 BASE) MCG/ACT IN AERS: 2.0000 | INHALATION_SPRAY | RESPIRATORY_TRACT | Status: DC | PRN

## 2022-01-08 MED ORDER — ALBUTEROL SULFATE (2.5 MG/3ML) 0.083% IN NEBU
2.5000 mg | INHALATION_SOLUTION | Freq: Four times a day (QID) | RESPIRATORY_TRACT | Status: DC
  Administered 2022-01-08 – 2022-01-10 (×7): 2.5 mg via RESPIRATORY_TRACT
  Filled 2022-01-08 (×7): qty 3

## 2022-01-08 MED ORDER — GUAIFENESIN ER 600 MG PO TB12
1200.0000 mg | ORAL_TABLET | Freq: Two times a day (BID) | ORAL | Status: DC
  Administered 2022-01-08 – 2022-01-10 (×4): 1200 mg via ORAL
  Filled 2022-01-08 (×4): qty 2

## 2022-01-08 MED ORDER — ALBUTEROL SULFATE (2.5 MG/3ML) 0.083% IN NEBU: 2.5000 mg | INHALATION_SOLUTION | RESPIRATORY_TRACT | Status: DC | PRN

## 2022-01-08 MED ORDER — OSELTAMIVIR PHOSPHATE 75 MG PO CAPS: 75.0000 mg | ORAL_CAPSULE | Freq: Every day | ORAL | Status: DC

## 2022-01-08 MED ORDER — ALBUTEROL SULFATE (2.5 MG/3ML) 0.083% IN NEBU
10.0000 mg/h | INHALATION_SOLUTION | RESPIRATORY_TRACT | Status: DC
  Administered 2022-01-08 (×2): 10 mg/h via RESPIRATORY_TRACT
  Filled 2022-01-08: qty 3
  Filled 2022-01-08: qty 12
  Filled 2022-01-08 (×3): qty 3
  Filled 2022-01-08: qty 12
  Filled 2022-01-08: qty 3

## 2022-01-08 MED ORDER — ONDANSETRON HCL 4 MG/2ML IJ SOLN
4.0000 mg | Freq: Once | INTRAMUSCULAR | Status: AC
  Administered 2022-01-08: 4 mg via INTRAVENOUS
  Filled 2022-01-08: qty 2

## 2022-01-08 MED ORDER — OSELTAMIVIR PHOSPHATE 75 MG PO CAPS
75.0000 mg | ORAL_CAPSULE | Freq: Once | ORAL | Status: AC
  Administered 2022-01-08: 75 mg via ORAL
  Filled 2022-01-08: qty 1

## 2022-01-08 MED ORDER — ACETAMINOPHEN 325 MG PO TABS: 650.0000 mg | ORAL_TABLET | Freq: Four times a day (QID) | ORAL | Status: DC | PRN

## 2022-01-08 MED ORDER — ACETAMINOPHEN 325 MG PO TABS
650.0000 mg | ORAL_TABLET | Freq: Once | ORAL | Status: AC
  Administered 2022-01-08: 650 mg via ORAL
  Filled 2022-01-08: qty 2

## 2022-01-08 MED ORDER — IPRATROPIUM-ALBUTEROL 0.5-2.5 (3) MG/3ML IN SOLN
3.0000 mL | Freq: Once | RESPIRATORY_TRACT | Status: AC
  Administered 2022-01-08: 3 mL via RESPIRATORY_TRACT
  Filled 2022-01-08: qty 3

## 2022-01-08 MED ORDER — METHYLPREDNISOLONE SODIUM SUCC 125 MG IJ SOLR
125.0000 mg | Freq: Once | INTRAMUSCULAR | Status: AC
  Administered 2022-01-08: 125 mg via INTRAVENOUS
  Filled 2022-01-08: qty 2

## 2022-01-08 MED ORDER — METHYLPREDNISOLONE SODIUM SUCC 40 MG IJ SOLR
40.0000 mg | Freq: Two times a day (BID) | INTRAMUSCULAR | Status: AC
  Administered 2022-01-09 (×2): 40 mg via INTRAVENOUS
  Filled 2022-01-08 (×2): qty 1

## 2022-01-08 MED ORDER — NICOTINE 21 MG/24HR TD PT24
21.0000 mg | MEDICATED_PATCH | Freq: Every day | TRANSDERMAL | Status: DC
  Administered 2022-01-08 – 2022-01-10 (×3): 21 mg via TRANSDERMAL
  Filled 2022-01-08 (×4): qty 1

## 2022-01-08 MED ORDER — OSELTAMIVIR PHOSPHATE 75 MG PO CAPS
75.0000 mg | ORAL_CAPSULE | Freq: Two times a day (BID) | ORAL | Status: DC
  Administered 2022-01-08 – 2022-01-10 (×4): 75 mg via ORAL
  Filled 2022-01-08 (×4): qty 1

## 2022-01-08 NOTE — ED Provider Notes (Signed)
[facility] DEPT Provider Note   CSN: [id number] Arrival date & time: 01/08/22  1133     History  Chief Complaint  Patient presents with   Shortness of Breath    Keith Henry is a 46 y.o. male.  Pt is a 46 yo male with a pmhx significant for asthma.  He said he has been using his inhalers and his nebulizers without improvement in sx.  Pt was admitted at the end of November for an asthma exacerbation.  Pt said he's been staying away from cigarettes.  He denies f/c.         Home Medications Prior to Admission medications   Medication Sig Start Date End Date Taking? Authorizing Provider  albuterol (PROVENTIL) (2.5 MG/3ML) 0.083% nebulizer solution Inhale 3 mLs (2.5 mg total) by nebulization every 6 (six) hours as needed for wheezing or shortness of breath. 01/02/22   Donne Hazel, MD  albuterol (VENTOLIN HFA) 108 (90 Base) MCG/ACT inhaler Inhale 2 puffs into the lungs every 4 (four) hours as needed for wheezing or shortness of breath. 01/02/22   Donne Hazel, MD  fluticasone-salmeterol (ADVAIR) 250-50 MCG/ACT AEPB Inhale 1 puff into the lungs in the morning and at bedtime. 01/02/22   Donne Hazel, MD  predniSONE (DELTASONE) 10 MG tablet Take 6 tablets by mouth daily with breakfast for 2 days-- THEN 4 tablets daily for 2 days-- THEN 2 tabs daily for 2 days-- THEN 1 tab daily for 2 days-- THEN 1/2 tab daily for 2 days. 01/02/22 01/12/22  Donne Hazel, MD      Allergies    Other and Pork-derived products    Review of Systems   Review of Systems  Respiratory:  Positive for shortness of breath and wheezing.   All other systems reviewed and are negative.   Physical Exam Updated Vital Signs BP (!) 149/95   Pulse 88   Temp 98.5 F (36.9 C) (Oral)   Resp (!) 22   SpO2 97%  Physical Exam Vitals and nursing note reviewed.  Constitutional:      Appearance: He is well-developed.  HENT:     Head: Normocephalic and atraumatic.      Mouth/Throat:     Mouth: Mucous membranes are moist.     Pharynx: Oropharynx is clear.  Eyes:     Extraocular Movements: Extraocular movements intact.     Pupils: Pupils are equal, round, and reactive to light.  Cardiovascular:     Rate and Rhythm: Regular rhythm. Tachycardia present.  Pulmonary:     Effort: Tachypnea present.     Breath sounds: Wheezing present.  Abdominal:     General: Bowel sounds are normal.     Palpations: Abdomen is soft.  Musculoskeletal:        General: Normal range of motion.     Cervical back: Normal range of motion and neck supple.  Skin:    General: Skin is warm.     Capillary Refill: Capillary refill takes less than 2 seconds.  Neurological:     General: No focal deficit present.     Mental Status: He is alert and oriented to person, place, and time.  Psychiatric:        Mood and Affect: Mood normal.        Behavior: Behavior normal.     ED Results / Procedures / Treatments   Labs (all labs ordered are listed, but only abnormal results are displayed) Labs Reviewed  RESP PANEL BY  RT-PCR (FLU A&B, COVID) ARPGX2 - Abnormal; Notable for the following components:      Result Value   Influenza A by PCR POSITIVE (*)    All other components within normal limits  BASIC METABOLIC PANEL - Abnormal; Notable for the following components:   Glucose, Bld 111 (*)    Calcium 8.4 (*)    All other components within normal limits  CBC WITH DIFFERENTIAL/PLATELET - Abnormal; Notable for the following components:   WBC 16.9 (*)    RBC 4.02 (*)    MCV 100.5 (*)    Neutro Abs 11.9 (*)    Monocytes Absolute 1.4 (*)    Eosinophils Absolute 0.7 (*)    Basophils Absolute 0.2 (*)    Abs Immature Granulocytes 0.16 (*)    All other components within normal limits    EKG EKG Interpretation  Date/Time:  Wednesday January 08 2022 11:38:19 EST Ventricular Rate:  118 PR Interval:  140 QRS Duration: 94 QT Interval:  290 QTC Calculation: 407 R Axis:   75 Text  Interpretation: Sinus tachycardia Consider right atrial enlargement Consider RVH w/ secondary repol abnormality Abnormal T, consider ischemia, lateral leads Baseline wander in lead(s) V5 Since last tracing rate faster Confirmed by Jacalyn Lefevre [phone]) on 01/08/2022 12:27:17 PM  Radiology DG Chest 2 View  Result Date: 01/08/2022 CLINICAL DATA:  Shortness of breath.  Chest pain. EXAM: CHEST - 2 VIEW COMPARISON:  Chest two views 12/30/2021, 12/07/2021; CT chest 12/06/2020 FINDINGS: Cardiac silhouette is at the upper limits of normal size. Mediastinal contours are within normal limits. The lungs are clear. No pulmonary edema, pleural effusion, or pneumothorax. Mild multilevel degenerative disc changes of the thoracic spine. IMPRESSION: No active cardiopulmonary disease. Electronically Signed   By: Neita Garnet M.D.   On: 01/08/2022 13:20    Procedures Procedures    Medications Ordered in ED Medications  albuterol (PROVENTIL) (2.5 MG/3ML) 0.083% nebulizer solution (10 mg/hr Nebulization New Bag/Given 01/08/22 1432)  oseltamivir (TAMIFLU) capsule 75 mg (has no administration in time range)  methylPREDNISolone sodium succinate (SOLU-MEDROL) 125 mg/2 mL injection 125 mg (125 mg Intravenous Given 01/08/22 1206)  ipratropium-albuterol (DUONEB) 0.5-2.5 (3) MG/3ML nebulizer solution 3 mL (3 mLs Nebulization Given 01/08/22 1158)  ondansetron (ZOFRAN) injection 4 mg (4 mg Intravenous Given 01/08/22 1214)    ED Course/ Medical Decision Making/ A&P                           Medical Decision Making Amount and/or Complexity of Data Reviewed Labs: ordered. Radiology: ordered.  Risk Prescription drug management. Decision regarding hospitalization.   This patient presents to the ED for concern of sob, this involves an extensive number of treatment options, and is a complaint that carries with it a high risk of complications and morbidity.  The differential diagnosis includes asthma exac, covid/flu   Co  morbidities that complicate the patient evaluation  asthma   Additional history obtained:  Additional history obtained from epic chart review    Lab Tests:  I Ordered, and personally interpreted labs.  The pertinent results include:  cbc with wbc elevated at 16.9; bmp is nl, covid neg, flu A +   Imaging Studies ordered:  I ordered imaging studies including cxr  I independently visualized and interpreted imaging which showed No active cardiopulmonary disease.  I agree with the radiologist interpretation   Cardiac Monitoring:  The patient was maintained on a cardiac monitor.  I personally viewed and  interpreted the cardiac monitored which showed an underlying rhythm of: st   Medicines ordered and prescription drug management:  I ordered medication including solumedrol, duoneb, albuterol  for asthma  Reevaluation of the patient after these medicines showed that the patient stayed the same I have reviewed the patients home medicines and have made adjustments as needed    Critical Interventions:  Steroids, nebs, oxygen   Consultations Obtained:  I requested consultation with the hospitalist (Dr. Marylyn Ishihara),  and discussed lab and imaging findings as well as pertinent plan - he will admit  Problem List / ED Course:  Asthma exacerbation and Influenza A:  sob is not improving with nebs.  O2 sat dropped to 88% on RA, so he was put on 2L.  Additional nebs ordered.   Reevaluation:  After the interventions noted above, I reevaluated the patient and found that they have :stayed the same   Social Determinants of Health:  Lives at home   Dispostion:  After consideration of the diagnostic results and the patients response to treatment, I feel that the patent would benefit from admission.    CRITICAL CARE Performed by: Isla Pence   Total critical care time: 30 minutes  Critical care time was exclusive of separately billable procedures and treating other  patients.  Critical care was necessary to treat or prevent imminent or life-threatening deterioration.  Critical care was time spent personally by me on the following activities: development of treatment plan with patient and/or surrogate as well as nursing, discussions with consultants, evaluation of patient's response to treatment, examination of patient, obtaining history from patient or surrogate, ordering and performing treatments and interventions, ordering and review of laboratory studies, ordering and review of radiographic studies, pulse oximetry and re-evaluation of patient's condition.         Final Clinical Impression(s) / ED Diagnoses Final diagnoses:  Moderate persistent asthma with exacerbation  Influenza A  Acute respiratory failure with hypoxia [facility])    Rx / DC Orders ED Discharge Orders     None         Isla Pence, MD 01/08/22 1434

## 2022-01-08 NOTE — ED Triage Notes (Signed)
Pt c/o SOB x2 days and chest pain starting last night.  Pain score 10/10.  Hx of asthma.  Pt reports taking inhaler and neb w/o relief.

## 2022-01-08 NOTE — H&P (Signed)
History and Physical    Patient: Keith Henry [id number] DOB: 1975-10-25 DOA: 01/08/2022 DOS: the patient was seen and examined on 01/08/2022 PCP: Patient, No Pcp Per  Patient coming from: Home  Chief Complaint:  Chief Complaint  Patient presents with   Shortness of Breath   HPI: Keith Henry is a 46 y.o. male with medical history significant of asthma, tobacco abuse. Presenting with shortness of breath. He reports that he was doing well since his discharge from the hospital after an asthma exacerbation. However, last night he became short of breath again. He had productive cough. He didn't have any fevers. He tried his nebulizer twice, but it didn't provide complete relief. When he woke this morning, he still felt short of breath. His symptoms worsened throughout the morning. He tried his rescue inhaler twice, but it didn't help. So, he decided to come to the ED for evaluation. He denies any other aggravating or alleviating factors.   Review of Systems: As mentioned in the history of present illness. All other systems reviewed and are negative. Past Medical History:  Diagnosis Date   Asthma    Past Surgical History:  Procedure Laterality Date   ANKLE SURGERY     SKIN GRAFT     SKULL FRACTURE ELEVATION     pt unsure of specifics   Social History:  reports that he quit smoking about 6 months ago. His smoking use included cigarettes. He smoked an average of 2 packs per day. He has never used smokeless tobacco. He reports current alcohol use of about 2.0 standard drinks of alcohol per week. He reports current drug use. Drug: Marijuana.  Allergies  Allergen Reactions   Other Anaphylaxis    Pork   Pork-Derived Products     Family History  Problem Relation Age of Onset   Hypertension Other     Prior to Admission medications   Medication Sig Start Date End Date Taking? Authorizing Provider  albuterol (PROVENTIL) (2.5 MG/3ML) 0.083% nebulizer solution Inhale 3 mLs (2.5 mg total)  by nebulization every 6 (six) hours as needed for wheezing or shortness of breath. 01/02/22   Jerald Kief, MD  albuterol (VENTOLIN HFA) 108 (90 Base) MCG/ACT inhaler Inhale 2 puffs into the lungs every 4 (four) hours as needed for wheezing or shortness of breath. 01/02/22   Jerald Kief, MD  fluticasone-salmeterol (ADVAIR) 250-50 MCG/ACT AEPB Inhale 1 puff into the lungs in the morning and at bedtime. 01/02/22   Jerald Kief, MD  predniSONE (DELTASONE) 10 MG tablet Take 6 tablets by mouth daily with breakfast for 2 days-- THEN 4 tablets daily for 2 days-- THEN 2 tabs daily for 2 days-- THEN 1 tab daily for 2 days-- THEN 1/2 tab daily for 2 days. 01/02/22 01/12/22  Jerald Kief, MD    Physical Exam: Vitals:   01/08/22 1430 01/08/22 1445 01/08/22 1500 01/08/22 1530  BP: (!) 149/95 135/86 138/88 126/73  Pulse: 88 82 91 (!) 101  Resp: 20  20 20   Temp:      TempSrc:      SpO2: 97% 98% 100% 100%   General: 46 y.o. male resting in bed in NAD Eyes: PERRL, normal sclera ENMT: Nares patent w/o discharge, orophaynx clear, dentition normal, ears w/o discharge/lesions/ulcers Neck: Supple, trachea midline Cardiovascular: RRR, +S1, S2, no m/g/r, equal pulses throughout Respiratory: diffuse, exp wheeze, scattered rhonchi, increased WOB on 2L GI: BS+, NDNT, no masses noted, no organomegaly noted MSK: No e/c/c Neuro: A&O x 3,  no focal deficits Psyc: Appropriate interaction and affect, calm/cooperative  Data Reviewed:  Results for orders placed or performed during the hospital encounter of 01/08/22 (from the past 24 hour(s))  Basic metabolic panel     Status: Abnormal   Collection Time: 01/08/22 11:46 AM  Result Value Ref Range   Sodium 142 135 - 145 mmol/L   Potassium 4.1 3.5 - 5.1 mmol/L   Chloride 110 98 - 111 mmol/L   CO2 25 22 - 32 mmol/L   Glucose, Bld 111 (H) 70 - 99 mg/dL   BUN 14 6 - 20 mg/dL   Creatinine, Ser 6.43 0.61 - 1.24 mg/dL   Calcium 8.4 (L) 8.9 - 10.3 mg/dL    GFR, Estimated >83 >77 mL/min   Anion gap 7 5 - 15  CBC with Differential     Status: Abnormal   Collection Time: 01/08/22 11:46 AM  Result Value Ref Range   WBC 16.9 (H) 4.0 - 10.5 K/uL   RBC 4.02 (L) 4.22 - 5.81 MIL/uL   Hemoglobin 13.2 13.0 - 17.0 g/dL   HCT 93.9 68.8 - 64.8 %   MCV 100.5 (H) 80.0 - 100.0 fL   MCH 32.8 26.0 - 34.0 pg   MCHC 32.7 30.0 - 36.0 g/dL   RDW 47.2 07.2 - 18.2 %   Platelets 299 150 - 400 K/uL   nRBC 0.0 0.0 - 0.2 %   Neutrophils Relative % 71 %   Neutro Abs 11.9 (H) 1.7 - 7.7 K/uL   Lymphocytes Relative 15 %   Lymphs Abs 2.6 0.7 - 4.0 K/uL   Monocytes Relative 8 %   Monocytes Absolute 1.4 (H) 0.1 - 1.0 K/uL   Eosinophils Relative 4 %   Eosinophils Absolute 0.7 (H) 0.0 - 0.5 K/uL   Basophils Relative 1 %   Basophils Absolute 0.2 (H) 0.0 - 0.1 K/uL   Immature Granulocytes 1 %   Abs Immature Granulocytes 0.16 (H) 0.00 - 0.07 K/uL  Resp Panel by RT-PCR (Flu A&B, Covid) Anterior Nasal Swab     Status: Abnormal   Collection Time: 01/08/22 11:46 AM   Specimen: Anterior Nasal Swab  Result Value Ref Range   SARS Coronavirus 2 by RT PCR NEGATIVE NEGATIVE   Influenza A by PCR POSITIVE (A) NEGATIVE   Influenza B by PCR NEGATIVE NEGATIVE   CXR: No active cardiopulmonary disease.   Assessment and Plan: Asthma exacerbation Influenza A infection     - place in obs, tele     - continue tamiflu, nebs     - add guaifenesin     - wean O2 as able  Tobacco abuse     - counsel against further use     - nicotine patch  Advance Care Planning:   Code Status: FULL  Consults: None  Family Communication: None at bedside  Severity of Illness: The appropriate patient status for this patient is OBSERVATION. Observation status is judged to be reasonable and necessary in order to provide the required intensity of service to ensure the patient's safety. The patient's presenting symptoms, physical exam findings, and initial radiographic and laboratory data in the  context of their medical condition is felt to place them at decreased risk for further clinical deterioration. Furthermore, it is anticipated that the patient will be medically stable for discharge from the hospital within 2 midnights of admission.   Author: Teddy Spike, DO 01/08/2022 3:50 PM  For on call review [email or web address].

## 2022-01-08 NOTE — ED Notes (Signed)
Patient transported to X-ray 

## 2022-01-09 DIAGNOSIS — Z720 Tobacco use: Secondary | ICD-10-CM | POA: Diagnosis not present

## 2022-01-09 DIAGNOSIS — J101 Influenza due to other identified influenza virus with other respiratory manifestations: Secondary | ICD-10-CM

## 2022-01-09 DIAGNOSIS — J441 Chronic obstructive pulmonary disease with (acute) exacerbation: Secondary | ICD-10-CM | POA: Diagnosis not present

## 2022-01-09 DIAGNOSIS — J45901 Unspecified asthma with (acute) exacerbation: Secondary | ICD-10-CM | POA: Diagnosis not present

## 2022-01-09 LAB — COMPREHENSIVE METABOLIC PANEL
ALT: 18 U/L (ref 0–44)
AST: 23 U/L (ref 15–41)
Albumin: 3.2 g/dL — ABNORMAL LOW (ref 3.5–5.0)
Alkaline Phosphatase: 73 U/L (ref 38–126)
Anion gap: 9 (ref 5–15)
BUN: 16 mg/dL (ref 6–20)
CO2: 25 mmol/L (ref 22–32)
Calcium: 8.6 mg/dL — ABNORMAL LOW (ref 8.9–10.3)
Chloride: 104 mmol/L (ref 98–111)
Creatinine, Ser: 1.02 mg/dL (ref 0.61–1.24)
GFR, Estimated: >60 mL/min (ref >60)
Glucose, Bld: 126 mg/dL — ABNORMAL HIGH (ref 70–99)
Potassium: 4.3 mmol/L (ref 3.5–5.1)
Sodium: 138 mmol/L (ref 135–145)
Total Bilirubin: 0.7 mg/dL (ref 0.3–1.2)
Total Protein: 6 g/dL — ABNORMAL LOW (ref 6.5–8.1)

## 2022-01-09 LAB — CBC
HCT: 36.8 % — ABNORMAL LOW (ref 39.0–52.0)
Hemoglobin: 12.1 g/dL — ABNORMAL LOW (ref 13.0–17.0)
MCH: 32.5 pg (ref 26.0–34.0)
MCHC: 32.9 g/dL (ref 30.0–36.0)
MCV: 98.9 fL (ref 80.0–100.0)
Platelets: 259 10*3/uL (ref 150–400)
RBC: 3.72 MIL/uL — ABNORMAL LOW (ref 4.22–5.81)
RDW: 12.7 % (ref 11.5–15.5)
WBC: 16.3 10*3/uL — ABNORMAL HIGH (ref 4.0–10.5)
nRBC: 0 % (ref 0.0–0.2)

## 2022-01-09 MED ORDER — BUDESONIDE 0.25 MG/2ML IN SUSP
0.2500 mg | Freq: Two times a day (BID) | RESPIRATORY_TRACT | Status: DC
  Administered 2022-01-09 – 2022-01-10 (×3): 0.25 mg via RESPIRATORY_TRACT
  Filled 2022-01-09 (×3): qty 2

## 2022-01-09 NOTE — Progress Notes (Signed)
PROGRESS NOTE    [place]  NWG:[id number] DOB: 08-19-1975 DOA: 01/08/2022 PCP: Patient, No Pcp Per    Brief Narrative:  46 year old gentleman with history of chronic intermittent asthma, smoker who was recently admitted to the hospital with acute exacerbation of asthma and discharged home on 11/30 comes back to the hospital with 1 day of worsening shortness of breath with productive cough.  No fever.  Tried nebulizer at home without improvement.  In the emergency room on room air, significant wheezing and found to have influenza A infection.  Admitted due to significant findings.   Assessment & Plan:   Acute exacerbation of chronic intermittent asthma: Influenza A infection causing exacerbation. Agree with admission because of severity of symptoms. Continue with bronchodilator therapy, IV steroids, elevation and steroids.  Deep breathing exercises, incentive spirometry.  Mucolytic's. Supplemental oxygen to keep saturations more than 92%.  Mobility. Started on 5 days course of Tamiflu.  Smoker: Counseled to quit.  Nicotine patch.   DVT prophylaxis: SCDs Start: 01/08/22 2020   Code Status: Full code Family Communication: None Disposition Plan: Status is: Observation The patient will require care spanning > 2 midnights and should be moved to inpatient because: Significant shortness of breath and bronchospasm     Consultants:  None  Procedures:  None  Antimicrobials:  Tamiflu day 2/5   Subjective: Patient seen and examined.  Still in the emergency room.  He tells me that he has unrelenting cough and chest discomfort with cough and wheezing.  Remains afebrile.  Objective: Vitals:   01/09/22 0820 01/09/22 1030 01/09/22 1122 01/09/22 1130  BP:  (!) 167/130  (!) 143/86  Pulse:  84 87 83  Resp:    19  Temp:      TempSrc:      SpO2: 94% 92% 98% 96%   No intake or output data in the 24 hours ending 01/09/22 1136 There were no vitals filed for this  visit.  Examination:  General exam: Appears calm and comfortable at rest.  Distressed with coughing and wheezing episodes. Respiratory system: Mostly conducted upper airway sounds. Cardiovascular system: S1 & S2 heard, RRR. No pedal edema. Gastrointestinal system: Abdomen is nondistended, soft and nontender. No organomegaly or masses felt. Normal bowel sounds heard. Central nervous system: Alert and oriented. No focal neurological deficits. Extremities: Symmetric 5 x 5 power. Skin: No rashes, lesions or ulcers Psychiatry: Judgement and insight appear normal. Mood & affect appropriate.     Data Reviewed: I have personally reviewed following labs and imaging studies  CBC: Recent Labs  Lab 01/08/22 1146 01/09/22 0454  WBC 16.9* 16.3*  NEUTROABS 11.9*  --   HGB 13.2 12.1*  HCT 40.4 36.8*  MCV 100.5* 98.9  PLT 299 259   Basic Metabolic Panel: Recent Labs  Lab 01/08/22 1146 01/09/22 0454  NA 142 138  K 4.1 4.3  CL 110 104  CO2 25 25  GLUCOSE 111* 126*  BUN 14 16  CREATININE 1.00 1.02  CALCIUM 8.4* 8.6*   GFR: Estimated Creatinine Clearance: 101.7 mL/min (by C-G formula based on SCr of 1.02 mg/dL). Liver Function Tests: Recent Labs  Lab 01/09/22 0454  AST 23  ALT 18  ALKPHOS 73  BILITOT 0.7  PROT 6.0*  ALBUMIN 3.2*   No results for input(s): "LIPASE", "AMYLASE" in the last 168 hours. No results for input(s): "AMMONIA" in the last 168 hours. Coagulation Profile: No results for input(s): "INR", "PROTIME" in the last 168 hours. Cardiac Enzymes: No results for input(s): "  CKTOTAL", "CKMB", "CKMBINDEX", "TROPONINI" in the last 168 hours. BNP (last 3 results) No results for input(s): "PROBNP" in the last 8760 hours. HbA1C: No results for input(s): "HGBA1C" in the last 72 hours. CBG: No results for input(s): "GLUCAP" in the last 168 hours. Lipid Profile: No results for input(s): "CHOL", "HDL", "LDLCALC", "TRIG", "CHOLHDL", "LDLDIRECT" in the last 72  hours. Thyroid Function Tests: No results for input(s): "TSH", "T4TOTAL", "FREET4", "T3FREE", "THYROIDAB" in the last 72 hours. Anemia Panel: No results for input(s): "VITAMINB12", "FOLATE", "FERRITIN", "TIBC", "IRON", "RETICCTPCT" in the last 72 hours. Sepsis Labs: No results for input(s): "PROCALCITON", "LATICACIDVEN" in the last 168 hours.  Recent Results (from the past 240 hour(s))  Respiratory (~20 pathogens) panel by PCR     Status: None   Collection Time: 12/31/21 12:26 PM   Specimen: Nasopharyngeal Swab; Respiratory  Result Value Ref Range Status   Adenovirus NOT DETECTED NOT DETECTED Final   Coronavirus 229E NOT DETECTED NOT DETECTED Final    Comment: (NOTE) The Coronavirus on the Respiratory Panel, DOES NOT test for the novel  Coronavirus (2019 nCoV)    Coronavirus HKU1 NOT DETECTED NOT DETECTED Final   Coronavirus NL63 NOT DETECTED NOT DETECTED Final   Coronavirus OC43 NOT DETECTED NOT DETECTED Final   Metapneumovirus NOT DETECTED NOT DETECTED Final   Rhinovirus / Enterovirus NOT DETECTED NOT DETECTED Final   Influenza A NOT DETECTED NOT DETECTED Final   Influenza B NOT DETECTED NOT DETECTED Final   Parainfluenza Virus 1 NOT DETECTED NOT DETECTED Final   Parainfluenza Virus 2 NOT DETECTED NOT DETECTED Final   Parainfluenza Virus 3 NOT DETECTED NOT DETECTED Final   Parainfluenza Virus 4 NOT DETECTED NOT DETECTED Final   Respiratory Syncytial Virus NOT DETECTED NOT DETECTED Final   Bordetella pertussis NOT DETECTED NOT DETECTED Final   Bordetella Parapertussis NOT DETECTED NOT DETECTED Final   Chlamydophila pneumoniae NOT DETECTED NOT DETECTED Final   Mycoplasma pneumoniae NOT DETECTED NOT DETECTED Final    Comment: Performed at [facility], [address], [place], [place] [address]  Resp Panel by RT-PCR (Flu A&B, Covid) Anterior Nasal Swab     Status: Abnormal   Collection Time: 01/08/22 11:46 AM   Specimen: Anterior Nasal Swab  Result Value Ref Range Status    SARS Coronavirus 2 by RT PCR NEGATIVE NEGATIVE Final    Comment: (NOTE) SARS-CoV-2 target nucleic acids are NOT DETECTED.  The SARS-CoV-2 RNA is generally detectable in upper respiratory specimens during the acute phase of infection. The lowest concentration of SARS-CoV-2 viral copies this assay can detect is 138 copies/mL. A negative result does not preclude SARS-Cov-2 infection and should not be used as the sole basis for treatment or other patient management decisions. A negative result may occur with  improper specimen collection/handling, submission of specimen other than nasopharyngeal swab, presence of viral mutation(s) within the areas targeted by this assay, and inadequate number of viral copies(<138 copies/mL). A negative result must be combined with clinical observations, patient history, and epidemiological information. The expected result is Negative.  Fact Sheet for Patients:  BloggerCourse.com  Fact Sheet for Healthcare Providers:  SeriousBroker.it  This test is no t yet approved or cleared by the [place] FDA and  has been authorized for detection and/or diagnosis of SARS-CoV-2 by FDA under an Emergency Use Authorization (EUA). This EUA will remain  in effect (meaning this test can be used) for the duration of the COVID-19 declaration under Section 564(b)(1) of the Act, 21  U.S.C.section 360bbb-3(b)(1), unless the authorization is terminated  or revoked sooner.       Influenza A by PCR POSITIVE (A) NEGATIVE Final   Influenza B by PCR NEGATIVE NEGATIVE Final    Comment: (NOTE) The Xpert Xpress SARS-CoV-2/FLU/RSV plus assay is intended as an aid in the diagnosis of influenza from Nasopharyngeal swab specimens and should not be used as a sole basis for treatment. Nasal washings and aspirates are unacceptable for Xpert Xpress SARS-CoV-2/FLU/RSV testing.  Fact Sheet for  Patients: BloggerCourse.com  Fact Sheet for Healthcare Providers: SeriousBroker.it  This test is not yet approved or cleared by the [place] FDA and has been authorized for detection and/or diagnosis of SARS-CoV-2 by FDA under an Emergency Use Authorization (EUA). This EUA will remain in effect (meaning this test can be used) for the duration of the COVID-19 declaration under Section 564(b)(1) of the Act, 21 U.S.C. section 360bbb-3(b)(1), unless the authorization is terminated or revoked.  Performed at [facility], [address] [place], [place] [address]          Radiology Studies: DG Chest 2 View  Result Date: 01/08/2022 CLINICAL DATA:  Shortness of breath.  Chest pain. EXAM: CHEST - 2 VIEW COMPARISON:  Chest two views 12/30/2021, 12/07/2021; CT chest 12/06/2020 FINDINGS: Cardiac silhouette is at the upper limits of normal size. Mediastinal contours are within normal limits. The lungs are clear. No pulmonary edema, pleural effusion, or pneumothorax. Mild multilevel degenerative disc changes of the thoracic spine. IMPRESSION: No active cardiopulmonary disease. Electronically Signed   By: Neita Garnet M.D.   On: 01/08/2022 13:20        Scheduled Meds:  albuterol  2.5 mg Nebulization Q6H   budesonide (PULMICORT) nebulizer solution  0.25 mg Nebulization BID   guaiFENesin  1,200 mg Oral BID   methylPREDNISolone (SOLU-MEDROL) injection  40 mg Intravenous Q12H   nicotine  21 mg Transdermal Daily   oseltamivir  75 mg Oral BID   Continuous Infusions:  albuterol Stopped (01/08/22 2018)     LOS: 0 days    Time spent: 35 minutes    Dorcas Carrow, MD [facility] Pager [phone]

## 2022-01-10 ENCOUNTER — Other Ambulatory Visit (HOSPITAL_COMMUNITY): Payer: Self-pay

## 2022-01-10 DIAGNOSIS — J45901 Unspecified asthma with (acute) exacerbation: Secondary | ICD-10-CM | POA: Diagnosis not present

## 2022-01-10 DIAGNOSIS — J441 Chronic obstructive pulmonary disease with (acute) exacerbation: Secondary | ICD-10-CM | POA: Diagnosis not present

## 2022-01-10 MED ORDER — FLUTICASONE-SALMETEROL 250-50 MCG/ACT IN AEPB
1.0000 | INHALATION_SPRAY | Freq: Two times a day (BID) | RESPIRATORY_TRACT | 0 refills | Status: DC
  Filled 2022-01-10: qty 60, 30d supply, fill #0

## 2022-01-10 MED ORDER — ALBUTEROL SULFATE (2.5 MG/3ML) 0.083% IN NEBU
2.5000 mg | INHALATION_SOLUTION | Freq: Four times a day (QID) | RESPIRATORY_TRACT | 0 refills | Status: DC | PRN
  Filled 2022-01-10: qty 270, 23d supply, fill #0

## 2022-01-10 MED ORDER — OSELTAMIVIR PHOSPHATE 75 MG PO CAPS
75.0000 mg | ORAL_CAPSULE | Freq: Two times a day (BID) | ORAL | 0 refills | Status: AC
  Filled 2022-01-10: qty 6, 3d supply, fill #0

## 2022-01-10 MED ORDER — ALBUTEROL SULFATE HFA 108 (90 BASE) MCG/ACT IN AERS
2.0000 | INHALATION_SPRAY | RESPIRATORY_TRACT | 0 refills | Status: DC | PRN
  Filled 2022-01-10 (×2): qty 6.7, 17d supply, fill #0

## 2022-01-10 NOTE — TOC Transition Note (Signed)
Transition of Care (TOC) - CM/SW Discharge Note   Patient Details  Name: Keith Henry MRN: [id number] Date of Birth: 07/03/1975  Transition of Care (TOC) CM/SW Contact:   A , LCSW Phone Number: 01/10/2022, 11:48 AM   Clinical Narrative:    TOC consulted for medication assistance and PCP needs. Met with pt who shares that the last time he was in the hospital he was unable to pick up his medications. Pt is agreeable to having meds to delivered to his room prior to discharge. Contacted [facility] for meds to bed.  Pt agreeable to having information for PCP added to AVS for pt to schedule appointment.  CSW discussed SDOH concerns for transportation and utilities. Pt agreeable to having resources placed on AVS.  Pt shares he is also interested in assistance with quitting smoking and obtaining nicotine patches. Pt agreeable to referral to [facility]. Referral has been faxed.     Final next level of care: Home/Self Care Barriers to Discharge: No Barriers Identified   Patient Goals and CMS Choice Patient states their goals for this hospitalization and ongoing recovery are:: To go home CMS Medicare.gov Compare Post Acute Care list provided to:: Patient Choice offered to / list presented to : Patient  Discharge Placement                       Discharge Plan and Services                DME Arranged: N/A DME Agency: NA                  Social Determinants of Health (SDOH) Interventions     Readmission Risk Interventions    11/12/2021    3:32 PM 02/13/2021   12:04 PM  Readmission Risk Prevention Plan  Transportation Screening Complete Complete  PCP or Specialist Appt within 5-7 Days  Not Complete  Not Complete comments  soonest apt 03/20/21  PCP or Specialist Appt within 3-5 Days Complete   Home Care Screening Not Completed Comments  patient is homeless. stays at friends when he can  Medication Review (RN CM)  Complete  HRI or Home Care Consult  Complete   Palliative Care Screening Not Applicable   Medication Review (RN Care Manager) Complete        

## 2022-01-10 NOTE — Discharge Summary (Signed)
Physician Discharge Summary  Keith Henry [id number] DOB: 04/26/1975 DOA: 01/08/2022  PCP: Patient, No Pcp Per  Admit date: 01/08/2022 Discharge date: 01/10/2022  Admitted From: Home Disposition: Home  Recommendations for Outpatient Follow-up:  Follow up with PCP in 1-2 weeks   Home Health: N/A Equipment/Devices: N/A  Discharge Condition: Stable CODE STATUS: Full code Diet recommendation: Low-salt diet  Discharge summary: 46 year old gentleman with history of chronic intermittent asthma, smoker who was recently admitted to the hospital with acute exacerbation of asthma and discharged home on 11/30 comes back to the hospital with 1 day of worsening shortness of breath with productive cough.  No fever.  Tried nebulizer at home without improvement.  In the emergency room, on room air, significant wheezing and found to have influenza A infection.  Admitted due to significant findings.  Acute exacerbation of chronic intermittent asthma due to acute Influenza A infection. Patient treated with IV steroids, inhalational steroids and chest physiotherapy.  Adequately improved today.  Also received Tamiflu.  On room air today. Plan: With adequate improvement he will be going home today.  He is already on prednisone taper that he will finish.  Patient was prescribed albuterol inhaler, nebulizer and Advair for maintenance asthma therapy. Strongly recommended to quit smoking and nicotine patch provided. Tamiflu for additional 3 days. Stable for discharge. Discharge Diagnoses:  Principal Problem:   Asthma exacerbation Active Problems:   Tobacco use   Influenza A    Discharge Instructions  Discharge Instructions     Call MD for:  difficulty breathing, headache or visual disturbances   Complete by: As directed    Diet general   Complete by: As directed    Increase activity slowly   Complete by: As directed       Allergies as of 01/10/2022       Reactions   Other Anaphylaxis    Pork   Pork-derived Products         Medication List     TAKE these medications    albuterol (2.5 MG/3ML) 0.083% nebulizer solution Commonly known as: PROVENTIL Inhale 3 mLs (2.5 mg total) by nebulization every 6 (six) hours as needed for wheezing or shortness of breath.   albuterol 108 (90 Base) MCG/ACT inhaler Commonly known as: VENTOLIN HFA Inhale 2 puffs into the lungs every 4 (four) hours as needed for wheezing or shortness of breath.   fluticasone-salmeterol 250-50 MCG/ACT Aepb Commonly known as: ADVAIR Inhale 1 puff into the lungs in the morning and at bedtime.   oseltamivir 75 MG capsule Commonly known as: TAMIFLU Take 1 capsule (75 mg total) by mouth 2 (two) times daily for 3 days.   predniSONE 10 MG tablet Commonly known as: DELTASONE Take 6 tablets by mouth daily with breakfast for 2 days-- THEN 4 tablets daily for 2 days-- THEN 2 tabs daily for 2 days-- THEN 1 tab daily for 2 days-- THEN 1/2 tab daily for 2 days. Start taking on: January 02, 2022        Allergies  Allergen Reactions   Other Anaphylaxis    Pork   Pork-Derived Products     Consultations: None   Procedures/Studies: DG Chest 2 View  Result Date: 01/08/2022 CLINICAL DATA:  Shortness of breath.  Chest pain. EXAM: CHEST - 2 VIEW COMPARISON:  Chest two views 12/30/2021, 12/07/2021; CT chest 12/06/2020 FINDINGS: Cardiac silhouette is at the upper limits of normal size. Mediastinal contours are within normal limits. The lungs are clear. No pulmonary edema, pleural effusion, or  pneumothorax. Mild multilevel degenerative disc changes of the thoracic spine. IMPRESSION: No active cardiopulmonary disease. Electronically Signed   By: Neita Garnet M.D.   On: 01/08/2022 13:20   DG Chest 2 View  Result Date: 12/30/2021 CLINICAL DATA:  Shortness of breath EXAM: CHEST - 2 VIEW COMPARISON:  12/07/2021 FINDINGS: The heart size and mediastinal contours are within normal limits. Both lungs are clear. The  visualized skeletal structures are unremarkable. IMPRESSION: No active cardiopulmonary disease. Electronically Signed   By: Alcide Clever M.D.   On: 12/30/2021 00:13   (Echo, Carotid, EGD, Colonoscopy, ERCP)    Subjective: Patient seen and examined.  Occasional wheezing but much better.  Eager to go home.   Discharge Exam: Vitals:   01/10/22 0521 01/10/22 0750  BP: (!) 154/89   Pulse: 94   Resp: 19   Temp: (!) 97.5 F (36.4 C)   SpO2: 98% 95%   Vitals:   01/10/22 0153 01/10/22 0209 01/10/22 0521 01/10/22 0750  BP:  (!) 155/83 (!) 154/89   Pulse:  88 94   Resp:  20 19   Temp:  (!) 97.5 F (36.4 C) (!) 97.5 F (36.4 C)   TempSrc:   Oral   SpO2: 93% 97% 98% 95%  Weight:      Height:        General: Pt is alert, awake, not in acute distress Cardiovascular: RRR, S1/S2 +, no rubs, no gallops Respiratory: CTA bilaterally, sparse expiratory wheezes. Abdominal: Soft, NT, ND, bowel sounds + Extremities: no edema, no cyanosis    The results of significant diagnostics from this hospitalization (including imaging, microbiology, ancillary and laboratory) are listed below for reference.     Microbiology: Recent Results (from the past 240 hour(s))  Respiratory (~20 pathogens) panel by PCR     Status: None   Collection Time: 12/31/21 12:26 PM   Specimen: Nasopharyngeal Swab; Respiratory  Result Value Ref Range Status   Adenovirus NOT DETECTED NOT DETECTED Final   Coronavirus 229E NOT DETECTED NOT DETECTED Final    Comment: (NOTE) The Coronavirus on the Respiratory Panel, DOES NOT test for the novel  Coronavirus (2019 nCoV)    Coronavirus HKU1 NOT DETECTED NOT DETECTED Final   Coronavirus NL63 NOT DETECTED NOT DETECTED Final   Coronavirus OC43 NOT DETECTED NOT DETECTED Final   Metapneumovirus NOT DETECTED NOT DETECTED Final   Rhinovirus / Enterovirus NOT DETECTED NOT DETECTED Final   Influenza A NOT DETECTED NOT DETECTED Final   Influenza B NOT DETECTED NOT DETECTED Final    Parainfluenza Virus 1 NOT DETECTED NOT DETECTED Final   Parainfluenza Virus 2 NOT DETECTED NOT DETECTED Final   Parainfluenza Virus 3 NOT DETECTED NOT DETECTED Final   Parainfluenza Virus 4 NOT DETECTED NOT DETECTED Final   Respiratory Syncytial Virus NOT DETECTED NOT DETECTED Final   Bordetella pertussis NOT DETECTED NOT DETECTED Final   Bordetella Parapertussis NOT DETECTED NOT DETECTED Final   Chlamydophila pneumoniae NOT DETECTED NOT DETECTED Final   Mycoplasma pneumoniae NOT DETECTED NOT DETECTED Final    Comment: Performed at [facility], [address] [place], [place] [address]  Resp Panel by RT-PCR (Flu A&B, Covid) Anterior Nasal Swab     Status: Abnormal   Collection Time: 01/08/22 11:46 AM   Specimen: Anterior Nasal Swab  Result Value Ref Range Status   SARS Coronavirus 2 by RT PCR NEGATIVE NEGATIVE Final    Comment: (NOTE) SARS-CoV-2 target nucleic acids are NOT DETECTED.  The SARS-CoV-2 RNA  is generally detectable in upper respiratory specimens during the acute phase of infection. The lowest concentration of SARS-CoV-2 viral copies this assay can detect is 138 copies/mL. A negative result does not preclude SARS-Cov-2 infection and should not be used as the sole basis for treatment or other patient management decisions. A negative result may occur with  improper specimen collection/handling, submission of specimen other than nasopharyngeal swab, presence of viral mutation(s) within the areas targeted by this assay, and inadequate number of viral copies(<138 copies/mL). A negative result must be combined with clinical observations, patient history, and epidemiological information. The expected result is Negative.  Fact Sheet for Patients:  EntrepreneurPulse.com.au  Fact Sheet for Healthcare Providers:  IncredibleEmployment.be  This test is no t yet approved or cleared by the [place] FDA and  has been authorized for  detection and/or diagnosis of SARS-CoV-2 by FDA under an Emergency Use Authorization (EUA). This EUA will remain  in effect (meaning this test can be used) for the duration of the COVID-19 declaration under Section 564(b)(1) of the Act, 21 U.S.C.section 360bbb-3(b)(1), unless the authorization is terminated  or revoked sooner.       Influenza A by PCR POSITIVE (A) NEGATIVE Final   Influenza B by PCR NEGATIVE NEGATIVE Final    Comment: (NOTE) The Xpert Xpress SARS-CoV-2/FLU/RSV plus assay is intended as an aid in the diagnosis of influenza from Nasopharyngeal swab specimens and should not be used as a sole basis for treatment. Nasal washings and aspirates are unacceptable for Xpert Xpress SARS-CoV-2/FLU/RSV testing.  Fact Sheet for Patients: EntrepreneurPulse.com.au  Fact Sheet for Healthcare Providers: IncredibleEmployment.be  This test is not yet approved or cleared by the [place] FDA and has been authorized for detection and/or diagnosis of SARS-CoV-2 by FDA under an Emergency Use Authorization (EUA). This EUA will remain in effect (meaning this test can be used) for the duration of the COVID-19 declaration under Section 564(b)(1) of the Act, 21 U.S.C. section 360bbb-3(b)(1), unless the authorization is terminated or revoked.  Performed at [facility], [place] [address], [place], [place] [address]      Labs: BNP (last 3 results) Recent Labs    08/14/21 1023  BNP 7.3   Basic Metabolic Panel: Recent Labs  Lab 01/08/22 1146 01/09/22 0454  NA 142 138  K 4.1 4.3  CL 110 104  CO2 25 25  GLUCOSE 111* 126*  BUN 14 16  CREATININE 1.00 1.02  CALCIUM 8.4* 8.6*   Liver Function Tests: Recent Labs  Lab 01/09/22 0454  AST 23  ALT 18  ALKPHOS 73  BILITOT 0.7  PROT 6.0*  ALBUMIN 3.2*   No results for input(s): "LIPASE", "AMYLASE" in the last 168 hours. No results for input(s): "AMMONIA" in the last 168  hours. CBC: Recent Labs  Lab 01/08/22 1146 01/09/22 0454  WBC 16.9* 16.3*  NEUTROABS 11.9*  --   HGB 13.2 12.1*  HCT 40.4 36.8*  MCV 100.5* 98.9  PLT 299 259   Cardiac Enzymes: No results for input(s): "CKTOTAL", "CKMB", "CKMBINDEX", "TROPONINI" in the last 168 hours. BNP: Invalid input(s): "POCBNP" CBG: No results for input(s): "GLUCAP" in the last 168 hours. D-Dimer No results for input(s): "DDIMER" in the last 72 hours. Hgb A1c No results for input(s): "HGBA1C" in the last 72 hours. Lipid Profile No results for input(s): "CHOL", "HDL", "LDLCALC", "TRIG", "CHOLHDL", "LDLDIRECT" in the last 72 hours. Thyroid function studies No results for input(s): "TSH", "T4TOTAL", "T3FREE", "THYROIDAB" in the last 72 hours.  Invalid input(s): "FREET3" Anemia work up No results for input(s): "VITAMINB12", "FOLATE", "FERRITIN", "TIBC", "IRON", "RETICCTPCT" in the last 72 hours. Urinalysis    Component Value Date/Time   COLORURINE YELLOW 02/08/2021 0248   APPEARANCEUR HAZY (A) 02/08/2021 0248   LABSPEC 1.024 02/08/2021 0248   PHURINE 5.0 02/08/2021 0248   GLUCOSEU NEGATIVE 02/08/2021 0248   HGBUR NEGATIVE 02/08/2021 0248   BILIRUBINUR NEGATIVE 02/08/2021 0248   KETONESUR NEGATIVE 02/08/2021 0248   PROTEINUR NEGATIVE 02/08/2021 0248   NITRITE NEGATIVE 02/08/2021 0248   LEUKOCYTESUR TRACE (A) 02/08/2021 0248   Sepsis Labs Recent Labs  Lab 01/08/22 1146 01/09/22 0454  WBC 16.9* 16.3*   Microbiology Recent Results (from the past 240 hour(s))  Respiratory (~20 pathogens) panel by PCR     Status: None   Collection Time: 12/31/21 12:26 PM   Specimen: Nasopharyngeal Swab; Respiratory  Result Value Ref Range Status   Adenovirus NOT DETECTED NOT DETECTED Final   Coronavirus 229E NOT DETECTED NOT DETECTED Final    Comment: (NOTE) The Coronavirus on the Respiratory Panel, DOES NOT test for the novel  Coronavirus (2019 nCoV)    Coronavirus HKU1 NOT DETECTED NOT DETECTED Final    Coronavirus NL63 NOT DETECTED NOT DETECTED Final   Coronavirus OC43 NOT DETECTED NOT DETECTED Final   Metapneumovirus NOT DETECTED NOT DETECTED Final   Rhinovirus / Enterovirus NOT DETECTED NOT DETECTED Final   Influenza A NOT DETECTED NOT DETECTED Final   Influenza B NOT DETECTED NOT DETECTED Final   Parainfluenza Virus 1 NOT DETECTED NOT DETECTED Final   Parainfluenza Virus 2 NOT DETECTED NOT DETECTED Final   Parainfluenza Virus 3 NOT DETECTED NOT DETECTED Final   Parainfluenza Virus 4 NOT DETECTED NOT DETECTED Final   Respiratory Syncytial Virus NOT DETECTED NOT DETECTED Final   Bordetella pertussis NOT DETECTED NOT DETECTED Final   Bordetella Parapertussis NOT DETECTED NOT DETECTED Final   Chlamydophila pneumoniae NOT DETECTED NOT DETECTED Final   Mycoplasma pneumoniae NOT DETECTED NOT DETECTED Final    Comment: Performed at [facility], [place]. [address], [place], [place] [address]  Resp Panel by RT-PCR (Flu A&B, Covid) Anterior Nasal Swab     Status: Abnormal   Collection Time: 01/08/22 11:46 AM   Specimen: Anterior Nasal Swab  Result Value Ref Range Status   SARS Coronavirus 2 by RT PCR NEGATIVE NEGATIVE Final    Comment: (NOTE) SARS-CoV-2 target nucleic acids are NOT DETECTED.  The SARS-CoV-2 RNA is generally detectable in upper respiratory specimens during the acute phase of infection. The lowest concentration of SARS-CoV-2 viral copies this assay can detect is 138 copies/mL. A negative result does not preclude SARS-Cov-2 infection and should not be used as the sole basis for treatment or other patient management decisions. A negative result may occur with  improper specimen collection/handling, submission of specimen other than nasopharyngeal swab, presence of viral mutation(s) within the areas targeted by this assay, and inadequate number of viral copies(<138 copies/mL). A negative result must be combined with clinical observations, patient history, and  epidemiological information. The expected result is Negative.  Fact Sheet for Patients:  EntrepreneurPulse.com.au  Fact Sheet for Healthcare Providers:  IncredibleEmployment.be  This test is no t yet approved or cleared by the [place] FDA and  has been authorized for detection and/or diagnosis of SARS-CoV-2 by FDA under an Emergency Use Authorization (EUA). This EUA will remain  in effect (meaning this test can be used) for the duration of the COVID-19 declaration under Section  564(b)(1) of the Act, 21 U.S.C.section 360bbb-3(b)(1), unless the authorization is terminated  or revoked sooner.       Influenza A by PCR POSITIVE (A) NEGATIVE Final   Influenza B by PCR NEGATIVE NEGATIVE Final    Comment: (NOTE) The Xpert Xpress SARS-CoV-2/FLU/RSV plus assay is intended as an aid in the diagnosis of influenza from Nasopharyngeal swab specimens and should not be used as a sole basis for treatment. Nasal washings and aspirates are unacceptable for Xpert Xpress SARS-CoV-2/FLU/RSV testing.  Fact Sheet for Patients: EntrepreneurPulse.com.au  Fact Sheet for Healthcare Providers: IncredibleEmployment.be  This test is not yet approved or cleared by the [place] FDA and has been authorized for detection and/or diagnosis of SARS-CoV-2 by FDA under an Emergency Use Authorization (EUA). This EUA will remain in effect (meaning this test can be used) for the duration of the COVID-19 declaration under Section 564(b)(1) of the Act, 21 U.S.C. section 360bbb-3(b)(1), unless the authorization is terminated or revoked.  Performed at [facility], [place] [address], [place], [place] [address]      Time coordinating discharge:  35 minutes  SIGNED:   Barb Merino, MD  [facility] 01/10/2022, 9:48 AM

## 2022-01-18 ENCOUNTER — Other Ambulatory Visit (HOSPITAL_COMMUNITY): Payer: Self-pay

## 2022-02-14 ENCOUNTER — Emergency Department (HOSPITAL_COMMUNITY)
Admission: EM | Admit: 2022-02-14 | Discharge: 2022-02-14 | Disposition: A | Discharge status: Home / Self Care | Payer: Self-pay | Attending: Emergency Medicine | Admitting: Emergency Medicine

## 2022-02-14 ENCOUNTER — Other Ambulatory Visit: Payer: Self-pay

## 2022-02-14 ENCOUNTER — Emergency Department (HOSPITAL_COMMUNITY): Payer: Self-pay

## 2022-02-14 ENCOUNTER — Encounter (HOSPITAL_COMMUNITY): Payer: Self-pay | Admitting: Emergency Medicine

## 2022-02-14 DIAGNOSIS — Z7951 Long term (current) use of inhaled steroids: Secondary | ICD-10-CM | POA: Insufficient documentation

## 2022-02-14 DIAGNOSIS — J4541 Moderate persistent asthma with (acute) exacerbation: Secondary | ICD-10-CM | POA: Insufficient documentation

## 2022-02-14 MED ORDER — ALBUTEROL SULFATE HFA 108 (90 BASE) MCG/ACT IN AERS
2.0000 | INHALATION_SPRAY | RESPIRATORY_TRACT | Status: DC | PRN
  Administered 2022-02-14 (×2): 2 via RESPIRATORY_TRACT
  Filled 2022-02-14 (×2): qty 6.7

## 2022-02-14 MED ORDER — ALBUTEROL SULFATE (2.5 MG/3ML) 0.083% IN NEBU: 2.5000 mg | INHALATION_SOLUTION | Freq: Four times a day (QID) | RESPIRATORY_TRACT | 0 refills | Status: DC | PRN

## 2022-02-14 MED ORDER — PREDNISONE 10 MG PO TABS: 40.0000 mg | ORAL_TABLET | Freq: Every day | ORAL | 0 refills | Status: AC

## 2022-02-14 MED ORDER — PREDNISONE 20 MG PO TABS
40.0000 mg | ORAL_TABLET | Freq: Once | ORAL | Status: AC
  Administered 2022-02-14: 40 mg via ORAL
  Filled 2022-02-14: qty 2

## 2022-02-14 MED ORDER — IPRATROPIUM-ALBUTEROL 0.5-2.5 (3) MG/3ML IN SOLN
3.0000 mL | RESPIRATORY_TRACT | Status: AC
  Administered 2022-02-14 (×3): 3 mL via RESPIRATORY_TRACT
  Filled 2022-02-14: qty 6
  Filled 2022-02-14: qty 3

## 2022-02-14 NOTE — ED Provider Notes (Signed)
[facility] EMERGENCY DEPARTMENT Provider Note  CSN: [id number] Arrival date & time: 02/14/22 9629  Chief Complaint(s) Shortness of Breath  HPI Keith Henry is a 47 y.o. male with a past medical history listed below including asthma who presents to the emergency department with 4 to 5 days of gradually worsening shortness of breath and dry cough.  This is consistent with asthma exacerbation per patient.  He reports that he ran out of his albuterol inhaler and nebs 10 to 14 days ago.  He denies any fevers or chills.  No chest pain.  No nausea or vomiting.  No other physical complaints.  The history is provided by the patient.    Past Medical History Past Medical History:  Diagnosis Date   Asthma    Patient Active Problem List   Diagnosis Date Noted   Influenza A 01/08/2022   Asthma 01/01/2022   Mild persistent asthma 11/14/2021   Acute asthma exacerbation 11/12/2021   Hyperbilirubinemia 11/12/2021   Mild protein malnutrition ([place]) 11/12/2021   Tobacco use 11/12/2021   Asthma exacerbation 02/08/2021   Home Medication(s) Prior to Admission medications   Medication Sig Start Date End Date Taking? Authorizing Provider  predniSONE (DELTASONE) 10 MG tablet Take 4 tablets (40 mg total) by mouth daily for 4 days. 02/14/22 02/18/22 Yes Everlynn Sagun, Grayce Sessions, MD  albuterol (PROVENTIL) (2.5 MG/3ML) 0.083% nebulizer solution Inhale 3 mLs (2.5 mg total) by nebulization every 6 (six) hours as needed for wheezing or shortness of breath. 02/14/22   Fatima Blank, MD  albuterol (VENTOLIN HFA) 108 (90 Base) MCG/ACT inhaler Inhale 2 puffs into the lungs every 4 (four) hours as needed for wheezing or shortness of breath. 01/10/22   Barb Merino, MD  fluticasone-salmeterol (ADVAIR) 250-50 MCG/ACT AEPB Inhale 1 puff into the lungs in the morning and at bedtime. 11/14/21   Dwyane Dee, MD  fluticasone-salmeterol (ADVAIR) 250-50 MCG/ACT AEPB Inhale 1 puff into the lungs in  the morning and at bedtime. 01/10/22   Barb Merino, MD                                                                                                                                    Allergies Other and Pork-derived products  Review of Systems Review of Systems As noted in HPI  Physical Exam Vital Signs  I have reviewed the triage vital signs BP 126/66   Pulse 94   Temp 98.6 F (37 C)   Resp 20   Ht 5\' 9"  (1.753 m)   Wt 95.3 kg   SpO2 97%   BMI 31.03 kg/m   Physical Exam Vitals reviewed.  Constitutional:      General: He is not in acute distress.    Appearance: He is well-developed. He is not diaphoretic.  HENT:     Head: Normocephalic and atraumatic.     Nose: Nose normal.  Eyes:     General:  No scleral icterus.       Right eye: No discharge.        Left eye: No discharge.     Conjunctiva/sclera: Conjunctivae normal.     Pupils: Pupils are equal, round, and reactive to light.  Cardiovascular:     Rate and Rhythm: Normal rate and regular rhythm.     Heart sounds: No murmur heard.    No friction rub. No gallop.  Pulmonary:     Effort: Pulmonary effort is normal. Prolonged expiration present. No respiratory distress.     Breath sounds: No stridor. Wheezing (diffuse) present. No rales.  Abdominal:     General: There is no distension.     Palpations: Abdomen is soft.     Tenderness: There is no abdominal tenderness.  Musculoskeletal:        General: No tenderness.     Cervical back: Normal range of motion and neck supple.  Skin:    General: Skin is warm and dry.     Findings: No erythema or rash.  Neurological:     Mental Status: He is alert and oriented to person, place, and time.     ED Results and Treatments Labs (all labs ordered are listed, but only abnormal results are displayed) Labs Reviewed - No data to display                                                                                                                       EKG  EKG  Interpretation  Date/Time:    Ventricular Rate:    PR Interval:    QRS Duration:   QT Interval:    QTC Calculation:   R Axis:     Text Interpretation:         Radiology DG Chest Port 1 View  Result Date: 02/14/2022 CLINICAL DATA:  [id number] with shortness of breath and chest pain. EXAM: PORTABLE CHEST 1 VIEW COMPARISON:  AP and lateral chest 01/08/2022 FINDINGS: The heart size and mediastinal contours are within normal limits. Both lungs are clear. The visualized skeletal structures are intact, with chronic healed overriding fracture deformity mid to distal right clavicle shaft. IMPRESSION: 1. No active disease. 2. Chronic healed fracture deformity of the right clavicle. Electronically Signed   By: Almira Bar M.D.   On: 02/14/2022 06:12    Medications Ordered in ED Medications  albuterol (VENTOLIN HFA) 108 (90 Base) MCG/ACT inhaler 2 puff (2 puffs Inhalation Given 02/14/22 0542)  ipratropium-albuterol (DUONEB) 0.5-2.5 (3) MG/3ML nebulizer solution 3 mL (3 mLs Nebulization Given 02/14/22 0645)  predniSONE (DELTASONE) tablet 40 mg (40 mg Oral Given 02/14/22 0559)  Procedures .Critical Care  Performed by: Fatima Blank, MD Authorized by: Fatima Blank, MD   Critical care provider statement:    Critical care time (minutes):  45   Critical care time was exclusive of:  Separately billable procedures and treating other patients   Critical care was necessary to treat or prevent imminent or life-threatening deterioration of the following conditions:  Respiratory failure   Critical care was time spent personally by me on the following activities:  Development of treatment plan with patient or surrogate, discussions with consultants, evaluation of patient's response to treatment, examination of patient, obtaining history from patient or surrogate,  review of old charts, re-evaluation of patient's condition, pulse oximetry, ordering and review of radiographic studies, ordering and review of laboratory studies and ordering and performing treatments and interventions   (including critical care time)  Medical Decision Making / ED Course   Medical Decision Making Amount and/or Complexity of Data Reviewed Radiology: ordered and independent interpretation performed. Decision-making details documented in ED Course.  Risk Prescription drug management.    Shortness of breath. Most consistent with asthma exacerbation.  Chest x-ray ordered and negative for any evidence of pneumonia, pneumothorax, pulmonary edema or pleural effusions.  Patient declined respiratory viral panel.  Treated with oral prednisone and DuoNebs.  WOB and wheezing improved.      Final Clinical Impression(s) / ED Diagnoses Final diagnoses:  Moderate persistent asthma with exacerbation   The patient appears reasonably screened and/or stabilized for discharge and I doubt any other medical condition or other [facility] requiring further screening, evaluation, or treatment in the ED at this time. I have discussed the findings, Dx and Tx plan with the patient/family who expressed understanding and agree(s) with the plan. Discharge instructions discussed at length. The patient/family was given strict return precautions who verbalized understanding of the instructions. No further questions at time of discharge.  Disposition: Discharge  Condition: Good  ED Discharge Orders          Ordered    predniSONE (DELTASONE) 10 MG tablet  Daily        02/14/22 0708    albuterol (PROVENTIL) (2.5 MG/3ML) 0.083% nebulizer solution  Every 6 hours PRN        02/14/22 0708            Follow Up: Primary care provider  Call  to schedule an appointment for close follow up           This chart was dictated using voice recognition software.  Despite best efforts to  proofread,  errors can occur which can change the documentation meaning.    Fatima Blank, MD 02/14/22 [phone]

## 2022-02-14 NOTE — ED Triage Notes (Signed)
Pt reports his asthma is acting up X3 day and he does not have his rescue inhalator.

## 2022-02-14 NOTE — ED Notes (Signed)
Patient reports taking an at home COVID test and tested negative, patient is refusing to be tested in ER.

## 2022-03-10 ENCOUNTER — Encounter (HOSPITAL_COMMUNITY): Payer: Self-pay | Admitting: Emergency Medicine

## 2022-03-10 ENCOUNTER — Other Ambulatory Visit: Payer: Self-pay

## 2022-03-10 ENCOUNTER — Emergency Department (HOSPITAL_COMMUNITY)
Admission: EM | Admit: 2022-03-10 | Discharge: 2022-03-10 | Disposition: A | Discharge status: Home / Self Care | Payer: Self-pay | Attending: Emergency Medicine | Admitting: Emergency Medicine

## 2022-03-10 DIAGNOSIS — Z79899 Other long term (current) drug therapy: Secondary | ICD-10-CM | POA: Insufficient documentation

## 2022-03-10 DIAGNOSIS — Z87891 Personal history of nicotine dependence: Secondary | ICD-10-CM | POA: Insufficient documentation

## 2022-03-10 DIAGNOSIS — J9801 Acute bronchospasm: Secondary | ICD-10-CM

## 2022-03-10 DIAGNOSIS — J45909 Unspecified asthma, uncomplicated: Secondary | ICD-10-CM | POA: Insufficient documentation

## 2022-03-10 MED ORDER — AEROCHAMBER Z-STAT PLUS/MEDIUM MISC
1.0000 | Freq: Once | Status: AC
  Administered 2022-03-10: 1
  Filled 2022-03-10: qty 1

## 2022-03-10 MED ORDER — ALBUTEROL SULFATE (2.5 MG/3ML) 0.083% IN NEBU
2.5000 mg | INHALATION_SOLUTION | Freq: Once | RESPIRATORY_TRACT | Status: AC
  Administered 2022-03-10: 2.5 mg via RESPIRATORY_TRACT
  Filled 2022-03-10: qty 3

## 2022-03-10 MED ORDER — IPRATROPIUM-ALBUTEROL 0.5-2.5 (3) MG/3ML IN SOLN
3.0000 mL | RESPIRATORY_TRACT | Status: DC
  Administered 2022-03-10: 3 mL via RESPIRATORY_TRACT
  Filled 2022-03-10: qty 3

## 2022-03-10 MED ORDER — ALBUTEROL SULFATE HFA 108 (90 BASE) MCG/ACT IN AERS
2.0000 | INHALATION_SPRAY | RESPIRATORY_TRACT | Status: DC | PRN
  Administered 2022-03-10: 2 via RESPIRATORY_TRACT
  Filled 2022-03-10: qty 6.7

## 2022-03-10 MED ORDER — NAPROXEN 500 MG PO TABS
500.0000 mg | ORAL_TABLET | Freq: Once | ORAL | Status: AC
  Administered 2022-03-10: 500 mg via ORAL
  Filled 2022-03-10: qty 1

## 2022-03-10 MED ORDER — METHYLPREDNISOLONE SODIUM SUCC 125 MG IJ SOLR
125.0000 mg | Freq: Once | INTRAMUSCULAR | Status: AC
  Administered 2022-03-10: 125 mg via INTRAMUSCULAR
  Filled 2022-03-10: qty 2

## 2022-03-10 NOTE — ED Notes (Signed)
Pt wheeled from ED. Pt calling a ride. Pt dressed for discharge. Pt has access to home .

## 2022-03-10 NOTE — ED Triage Notes (Signed)
Pt c/o asthma attack x 2 days. Pt states that he does not have his inhaler, but has been using neb treatments. Pt ambulatory to triage, able to speak full sentences, and eating.

## 2022-03-10 NOTE — ED Provider Notes (Signed)
[facility] DEPT Provider Note: Georgena Spurling, MD, FACEP  CSN: [id number] MRN: [id number] ARRIVAL: 03/10/22 at [place]: [id number]   CHIEF COMPLAINT  Asthma   HISTORY OF PRESENT ILLNESS  03/10/22 2:27 AM Keith Henry is a 47 y.o. male with a history of asthma.  He he is here with an asthma attack for the past 2 days.  He is out of his albuterol inhaler, did not get his steroid inhaler filled, but has been using neb treatments.  He is able to speak in full sentences.  He denies any pain.   Past Medical History:  Diagnosis Date   Asthma     Past Surgical History:  Procedure Laterality Date   ANKLE SURGERY     SKIN GRAFT     SKULL FRACTURE ELEVATION     pt unsure of specifics    Family History  Problem Relation Age of Onset   Hypertension Other     Social History   Tobacco Use   Smoking status: Former    Packs/day: 2.00    Types: Cigarettes    Quit date: 06/20/2021    Years since quitting: 0.7   Smokeless tobacco: Never  Vaping Use   Vaping Use: Never used  Substance Use Topics   Alcohol use: Yes    Alcohol/week: 2.0 standard drinks of alcohol    Types: 2 Standard drinks or equivalent per week    Comment: occasion   Drug use: Yes    Types: Marijuana    Prior to Admission medications   Medication Sig Start Date End Date Taking? Authorizing Provider  albuterol (PROVENTIL) (2.5 MG/3ML) 0.083% nebulizer solution Inhale 3 mLs (2.5 mg total) by nebulization every 6 (six) hours as needed for wheezing or shortness of breath. 02/14/22  Yes Cardama, Grayce Sessions, MD  albuterol (VENTOLIN HFA) 108 (90 Base) MCG/ACT inhaler Inhale 2 puffs into the lungs every 4 (four) hours as needed for wheezing or shortness of breath. 01/10/22  Yes Barb Merino, MD  fluticasone-salmeterol (ADVAIR) 250-50 MCG/ACT AEPB Inhale 1 puff into the lungs in the morning and at bedtime. 01/10/22   Barb Merino, MD    Allergies Other and Pork-derived products   REVIEW OF SYSTEMS   Negative except as noted here or in the History of Present Illness.   PHYSICAL EXAMINATION  Initial Vital Signs Blood pressure (!) 152/96, pulse 81, temperature 98 F (36.7 C), temperature source Oral, resp. rate (!) 22, height 5\' 10"  (1.778 m), weight 95.3 kg, SpO2 98 %.  Examination General: Well-developed, well-nourished male in no acute distress; appearance consistent with age of record HENT: normocephalic; atraumatic Eyes: Normal appearance Neck: supple Heart: regular rate and rhythm Lungs: Tory and expiratory wheezes Abdomen: soft; nondistended; nontender; bowel sounds present Extremities: No deformity; full range of motion Neurologic: Awake, alert and oriented; motor function intact in all extremities and symmetric; no facial droop Skin: Warm and dry Psychiatric: Normal mood and affect   RESULTS  Summary of this visit's results, reviewed and interpreted by myself:   EKG Interpretation  Date/Time:    Ventricular Rate:    PR Interval:    QRS Duration:   QT Interval:    QTC Calculation:   R Axis:     Text Interpretation:         Laboratory Studies: No results found for this or any previous visit (from the past 24 hour(s)). Imaging Studies: No results found.  ED COURSE and MDM  Nursing notes, initial and subsequent vitals signs, including  pulse oximetry, reviewed and interpreted by myself.  Vitals:   03/10/22 0220  BP: (!) 152/96  Pulse: 81  Resp: (!) 22  Temp: 98 F (36.7 C)  TempSrc: Oral  SpO2: 98%  Weight: 95.3 kg  Height: 5\' 10"  (1.778 m)   Medications  ipratropium-albuterol (DUONEB) 0.5-2.5 (3) MG/3ML nebulizer solution 3 mL (3 mLs Nebulization Given 03/10/22 0305)  albuterol (VENTOLIN HFA) 108 (90 Base) MCG/ACT inhaler 2 puff (2 puffs Inhalation Given 03/10/22 0416)  aerochamber Z-Stat Plus/medium 1 each (has no administration in time range)  naproxen (NAPROSYN) tablet 500 mg (has no administration in time range)  albuterol (PROVENTIL) (2.5  MG/3ML) 0.083% nebulizer solution 2.5 mg (2.5 mg Nebulization Given 03/10/22 0258)  methylPREDNISolone sodium succinate (SOLU-MEDROL) 125 mg/2 mL injection 125 mg (125 mg Intramuscular Given 03/10/22 0258)   3:37 AM Patient feeling better and wheezing improved after nebulizer treatment but patient still reticent to leave until he sees how he does.  We will provide him with an albuterol inhaler and AeroChamber.  He was given Solu-Medrol IM as well.  4:58 AM Lungs clear, patient feeling better.  He appears safe for discharge at this time.  He is complaining of chest wall pain due to work of breathing and we will treat him with naproxen.   PROCEDURES  Procedures   ED DIAGNOSES     ICD-10-CM   1. Acute bronchospasm  J98.01          Jailani Hogans, Jenny Reichmann, MD 03/10/22 [phone]

## 2022-03-11 ENCOUNTER — Emergency Department (HOSPITAL_COMMUNITY): Payer: Self-pay

## 2022-03-11 ENCOUNTER — Encounter (HOSPITAL_COMMUNITY): Payer: Self-pay

## 2022-03-11 ENCOUNTER — Other Ambulatory Visit: Payer: Self-pay

## 2022-03-11 ENCOUNTER — Emergency Department (HOSPITAL_COMMUNITY)
Admission: EM | Admit: 2022-03-11 | Discharge: 2022-03-12 | Discharge status: Against Medical Advice | Payer: Self-pay | Attending: Emergency Medicine | Admitting: Emergency Medicine

## 2022-03-11 DIAGNOSIS — Z5321 Procedure and treatment not carried out due to patient leaving prior to being seen by health care provider: Secondary | ICD-10-CM | POA: Insufficient documentation

## 2022-03-11 DIAGNOSIS — R0602 Shortness of breath: Secondary | ICD-10-CM | POA: Insufficient documentation

## 2022-03-11 DIAGNOSIS — R062 Wheezing: Secondary | ICD-10-CM | POA: Insufficient documentation

## 2022-03-11 DIAGNOSIS — R079 Chest pain, unspecified: Secondary | ICD-10-CM | POA: Insufficient documentation

## 2022-03-11 DIAGNOSIS — R059 Cough, unspecified: Secondary | ICD-10-CM | POA: Insufficient documentation

## 2022-03-11 DIAGNOSIS — R42 Dizziness and giddiness: Secondary | ICD-10-CM | POA: Insufficient documentation

## 2022-03-11 LAB — HEPATIC FUNCTION PANEL
ALT: 13 U/L (ref 0–44)
AST: 28 U/L (ref 15–41)
Albumin: 3.9 g/dL (ref 3.5–5.0)
Alkaline Phosphatase: 83 U/L (ref 38–126)
Bilirubin, Direct: 0.2 mg/dL (ref 0.0–0.2)
Indirect Bilirubin: 1.5 mg/dL — ABNORMAL HIGH (ref 0.3–0.9)
Total Bilirubin: 1.7 mg/dL — ABNORMAL HIGH (ref 0.3–1.2)
Total Protein: 6.7 g/dL (ref 6.5–8.1)

## 2022-03-11 LAB — BASIC METABOLIC PANEL
Anion gap: 11 (ref 5–15)
BUN: 14 mg/dL (ref 6–20)
CO2: 24 mmol/L (ref 22–32)
Calcium: 8.7 mg/dL — ABNORMAL LOW (ref 8.9–10.3)
Chloride: 100 mmol/L (ref 98–111)
Creatinine, Ser: 1.03 mg/dL (ref 0.61–1.24)
GFR, Estimated: >60 mL/min (ref >60)
Glucose, Bld: 100 mg/dL — ABNORMAL HIGH (ref 70–99)
Potassium: 3.5 mmol/L (ref 3.5–5.1)
Sodium: 135 mmol/L (ref 135–145)

## 2022-03-11 LAB — CBC WITH DIFFERENTIAL/PLATELET
Abs Immature Granulocytes: 0.04 10*3/uL (ref 0.00–0.07)
Basophils Absolute: 0.1 10*3/uL (ref 0.0–0.1)
Basophils Relative: 1 %
Eosinophils Absolute: 0.2 10*3/uL (ref 0.0–0.5)
Eosinophils Relative: 1 %
HCT: 41.8 % (ref 39.0–52.0)
Hemoglobin: 14.2 g/dL (ref 13.0–17.0)
Immature Granulocytes: 0 %
Lymphocytes Relative: 20 %
Lymphs Abs: 2.5 10*3/uL (ref 0.7–4.0)
MCH: 32.7 pg (ref 26.0–34.0)
MCHC: 34 g/dL (ref 30.0–36.0)
MCV: 96.3 fL (ref 80.0–100.0)
Monocytes Absolute: 0.8 10*3/uL (ref 0.1–1.0)
Monocytes Relative: 7 %
Neutro Abs: 9.1 10*3/uL — ABNORMAL HIGH (ref 1.7–7.7)
Neutrophils Relative %: 71 %
Platelets: 330 10*3/uL (ref 150–400)
RBC: 4.34 MIL/uL (ref 4.22–5.81)
RDW: 12.2 % (ref 11.5–15.5)
WBC: 12.8 10*3/uL — ABNORMAL HIGH (ref 4.0–10.5)
nRBC: 0 % (ref 0.0–0.2)

## 2022-03-11 LAB — MAGNESIUM: Magnesium: 1.8 mg/dL (ref 1.7–2.4)

## 2022-03-11 LAB — TROPONIN I (HIGH SENSITIVITY): Troponin I (High Sensitivity): 5 ng/L (ref <18)

## 2022-03-11 MED ORDER — ALBUTEROL SULFATE HFA 108 (90 BASE) MCG/ACT IN AERS
4.0000 | INHALATION_SPRAY | Freq: Once | RESPIRATORY_TRACT | Status: AC
  Administered 2022-03-11: 4 via RESPIRATORY_TRACT
  Filled 2022-03-11: qty 6.7

## 2022-03-11 NOTE — ED Notes (Signed)
Pt states he is leaving with wife and does not want to stay in waiting room any longer.

## 2022-03-11 NOTE — ED Triage Notes (Signed)
Pt c/o dry cough, chest pain, SOB and dizzinessx2. Pt had audible expiratory wheezes.

## 2022-03-11 NOTE — ED Provider Triage Note (Signed)
Emergency Medicine Provider Triage Evaluation Note  Kingdavid Leinbach , a 47 y.o. male  was evaluated in triage.  Pt complains of SOB and wheezing. Was just seen yesterday. Reports he has tried two nebs without success.  Review of Systems  Positive:  Negative:   Physical Exam  BP 115/82   Pulse 76   Temp 98.1 F (36.7 C) (Oral)   Resp 19   Ht 5\' 10"  (1.778 m)   Wt 95.3 kg   SpO2 99%   BMI 30.15 kg/m  Gen:   Awake, no distress   Resp:  Normal effort  MSK:   Moves extremities without difficulty  Other:  Expiratory wheezing heard in bilateral lower bases. Speaking in full sentences, satting well on RA without any increase work of breathing.  Medical Decision Making  Medically screening exam initiated at 4:27 PM.  Appropriate orders placed.  Kaire Stary was informed that the remainder of the evaluation will be completed by another provider, this initial triage assessment does not replace that evaluation, and the importance of remaining in the ED until their evaluation is complete.  Albuterol inhaler ordered   Sherrell Puller, PA-C 03/11/22 1628

## 2022-03-29 ENCOUNTER — Encounter (HOSPITAL_COMMUNITY): Payer: Self-pay

## 2022-03-29 ENCOUNTER — Other Ambulatory Visit: Payer: Self-pay

## 2022-03-29 ENCOUNTER — Emergency Department (HOSPITAL_COMMUNITY)
Admission: EM | Admit: 2022-03-29 | Discharge: 2022-03-29 | Disposition: A | Discharge status: Home / Self Care | Payer: No Typology Code available for payment source | Attending: Emergency Medicine | Admitting: Emergency Medicine

## 2022-03-29 DIAGNOSIS — K0889 Other specified disorders of teeth and supporting structures: Secondary | ICD-10-CM | POA: Insufficient documentation

## 2022-03-29 DIAGNOSIS — J45909 Unspecified asthma, uncomplicated: Secondary | ICD-10-CM | POA: Insufficient documentation

## 2022-03-29 DIAGNOSIS — Z7951 Long term (current) use of inhaled steroids: Secondary | ICD-10-CM | POA: Diagnosis not present

## 2022-03-29 DIAGNOSIS — M545 Low back pain, unspecified: Secondary | ICD-10-CM | POA: Diagnosis present

## 2022-03-29 MED ORDER — DICLOFENAC SODIUM 50 MG PO TBEC: 50.0000 mg | DELAYED_RELEASE_TABLET | Freq: Two times a day (BID) | ORAL | 1 refills | Status: AC

## 2022-03-29 MED ORDER — AMOXICILLIN 500 MG PO CAPS: 500.0000 mg | ORAL_CAPSULE | Freq: Three times a day (TID) | ORAL | 0 refills | Status: AC

## 2022-03-29 MED ORDER — LIDOCAINE 5 % EX PTCH
1.0000 | MEDICATED_PATCH | CUTANEOUS | Status: DC
  Administered 2022-03-29: 1 via TRANSDERMAL
  Filled 2022-03-29: qty 1

## 2022-03-29 MED ORDER — AMOXICILLIN 500 MG PO CAPS
500.0000 mg | ORAL_CAPSULE | Freq: Once | ORAL | Status: AC
  Administered 2022-03-29: 500 mg via ORAL
  Filled 2022-03-29: qty 1

## 2022-03-29 MED ORDER — KETOROLAC TROMETHAMINE 15 MG/ML IJ SOLN
30.0000 mg | Freq: Once | INTRAMUSCULAR | Status: AC
  Administered 2022-03-29: 30 mg via INTRAMUSCULAR
  Filled 2022-03-29: qty 2

## 2022-03-29 MED ORDER — METHOCARBAMOL 500 MG PO TABS: 500.0000 mg | ORAL_TABLET | Freq: Two times a day (BID) | ORAL | 0 refills | Status: AC | PRN

## 2022-03-29 NOTE — ED Triage Notes (Signed)
Pt with low back pain and right sided dental pain x 3 days.

## 2022-03-29 NOTE — Discharge Instructions (Signed)
Follow-up with a dentist.  Only a dentist can fix your problem.  We recommend that you take amoxicillin as prescribed to treat likely underlying infection.    For your back pain, alternate ice and heat to areas of injury 3-4 times per day to limit inflammation and spasm.  Avoid strenuous activity and heavy lifting.  We recommend consistent use of naproxen in addition to Robaxin for muscle spasms.  Do not drive or drink alcohol after taking Robaxin as it may make you drowsy and impair your judgment.  We recommend follow-up with a primary care doctor to ensure resolution of symptoms.  Return to the ED for any new or concerning symptoms.

## 2022-04-13 NOTE — ED Provider Notes (Signed)
[facility] Provider Note   CSN: [id number] Arrival date & time: 03/29/22  0003     History  Chief Complaint  Patient presents with   Back Pain    Keith Henry is a 47 y.o. male.  47 y/o male presents for R sided dental pain x 3 days. Pain constant, worsening, contributing to pain to R hemiface. No hx of trauma, fevers, discharge/drainage in the mouth. Patient does not have dental follow up. Also with c/o R lower back pain. Pain is constant, worse with movement, and nonradiating. No relief with supportive measures. Denies bowel/bladder incontinence, genital or perianal numbness, inability to ambulate, recent falls.  The history is provided by the patient. No language interpreter was used.  Back Pain      Home Medications Prior to Admission medications   Medication Sig Start Date End Date Taking? Authorizing Provider  amoxicillin (AMOXIL) 500 MG capsule Take 1 capsule (500 mg total) by mouth 3 (three) times daily. 03/29/22  Yes Antonietta Breach, PA-C  diclofenac (VOLTAREN) 50 MG EC tablet Take 1 tablet (50 mg total) by mouth 2 (two) times daily with a meal. 03/29/22  Yes Antonietta Breach, PA-C  methocarbamol (ROBAXIN) 500 MG tablet Take 1 tablet (500 mg total) by mouth every 12 (twelve) hours as needed for muscle spasms. 03/29/22  Yes Antonietta Breach, PA-C  albuterol (PROVENTIL) (2.5 MG/3ML) 0.083% nebulizer solution Inhale 3 mLs (2.5 mg total) by nebulization every 6 (six) hours as needed for wheezing or shortness of breath. 02/14/22   Fatima Blank, MD  albuterol (VENTOLIN HFA) 108 (90 Base) MCG/ACT inhaler Inhale 2 puffs into the lungs every 4 (four) hours as needed for wheezing or shortness of breath. 01/10/22   Barb Merino, MD  fluticasone-salmeterol (ADVAIR) 250-50 MCG/ACT AEPB Inhale 1 puff into the lungs in the morning and at bedtime. 01/10/22   Barb Merino, MD  predniSONE (DELTASONE) 10 MG tablet Take 10 mg by mouth daily with  breakfast.    [provider]      Allergies    Other and Pork-derived products    Review of Systems   Review of Systems  HENT:  Positive for dental problem.   Musculoskeletal:  Positive for back pain.  Ten systems reviewed and are negative for acute change, except as noted in the HPI.    Physical Exam Updated Vital Signs BP 130/86 (BP Location: Right Arm)   Pulse 62   Resp 18   Ht '5\' 10"'$  (1.778 m)   Wt 95.3 kg   SpO2 98%   BMI 30.15 kg/m   Physical Exam Vitals and nursing note reviewed.  Constitutional:      General: He is not in acute distress.    Appearance: He is well-developed. He is not diaphoretic.  HENT:     Head: Normocephalic and atraumatic.     Mouth/Throat:     Comments: Varying degree of dental decay/caries. Symmetric jaw opening with soft oral floor. Tongue midline without protrusion. No gingival fluctuance or drainage. Normal phonation.  Eyes:     General: No scleral icterus.    Conjunctiva/sclera: Conjunctivae normal.  Neck:     Comments: No meningismus Pulmonary:     Effort: Pulmonary effort is normal. No respiratory distress.  Musculoskeletal:        General: Normal range of motion.     Cervical back: Normal range of motion.     Comments: No bony deformities, step offs, crepitus to the lumbosacral  midline.   Skin:    General: Skin is warm and dry.     Coloration: Skin is not pale.     Findings: No erythema or rash.  Neurological:     Mental Status: He is alert and oriented to person, place, and time.     Comments: Ambulatory in the department with steady gait.  Psychiatric:        Behavior: Behavior normal.     ED Results / Procedures / Treatments   Labs (all labs ordered are listed, but only abnormal results are displayed) Labs Reviewed - No data to display  EKG None  Radiology No results found.  Procedures Procedures    Medications Ordered in ED Medications  amoxicillin (AMOXIL) capsule 500 mg (500 mg Oral Given  03/29/22 0325)  ketorolac (TORADOL) 15 MG/ML injection 30 mg (30 mg Intramuscular Given 03/29/22 0327)    ED Course/ Medical Decision Making/ A&P                             Medical Decision Making Risk Prescription drug management.   This patient presents to the ED for concern of dentalgia, this involves an extensive number of treatment options, and is a complaint that carries with it a high risk of complications and morbidity.  The differential diagnosis includes dental fracture vs dental abscess vs Ludwig's angina. There is also concern of back pain, this involves an extensive number of treatment options, and is a complaint that carries with it a high risk of complications and morbidity.  The differential diagnosis includes fx vs sprain/strain vs cauda equina   Co morbidities that complicate the patient evaluation  Asthma    Additional history obtained:  External records from outside source obtained and reviewed including prior ED visits for asthma complaints   Cardiac Monitoring:  The patient was maintained on a cardiac monitor.  I personally viewed and interpreted the cardiac monitored which showed an underlying rhythm of: NSR   Medicines ordered and prescription drug management:  I ordered medication including Amoxicillin for dental infection coverage and Toradol and lidoderm patch for back pain. Reevaluation of the patient after these medicines showed that the patient improved I have reviewed the patients home medicines and have made adjustments as needed   Test Considered:  Xray lumbar   Problem List / ED Course:  Patient with toothache. No gross abscess. Exam unconcerning for Ludwig's angina or spread of infection. Will treat with amoxicillin and NSAIDs. Urged patient to follow-up with dentist.   Patient also with back pain. No neurologic deficits; patient ambulatory. No loss of bowel or bladder control.  No concern for cauda equina. RICE protocol and pain  medicine indicated and discussed with patient.    Reevaluation:  After the interventions noted above, I reevaluated the patient and found that they have :stayed the same   Social Determinants of Health:  Uninsured    Dispostion:  After consideration of the diagnostic results and the patients response to treatment, I feel that the patent would benefit from course of Amoxicillin for dentalgia. Also given NSAIDs and Robaxin for back pain. Encouraged outpatient follow up for aforementioned issues. Return precautions discussed and provided. Patient discharged in stable condition with no unaddressed concerns.          Final Clinical Impression(s) / ED Diagnoses Final diagnoses:  Dentalgia  Acute right-sided low back pain without sciatica    Rx / DC Orders ED Discharge Orders  Ordered    amoxicillin (AMOXIL) 500 MG capsule  3 times daily        03/29/22 0411    diclofenac (VOLTAREN) 50 MG EC tablet  2 times daily with meals        03/29/22 0411    methocarbamol (ROBAXIN) 500 MG tablet  Every 12 hours PRN        03/29/22 0411              Antonietta Breach, PA-C 04/13/22 1504    Quintella Reichert, MD 04/22/22 2256

## 2022-05-15 ENCOUNTER — Emergency Department (HOSPITAL_COMMUNITY)
Admission: EM | Admit: 2022-05-15 | Discharge: 2022-05-16 | Disposition: A | Discharge status: Home / Self Care | Payer: No Typology Code available for payment source | Attending: Emergency Medicine | Admitting: Emergency Medicine

## 2022-05-15 ENCOUNTER — Encounter (HOSPITAL_COMMUNITY): Payer: Self-pay

## 2022-05-15 DIAGNOSIS — J4541 Moderate persistent asthma with (acute) exacerbation: Secondary | ICD-10-CM | POA: Diagnosis not present

## 2022-05-15 DIAGNOSIS — J45909 Unspecified asthma, uncomplicated: Secondary | ICD-10-CM | POA: Diagnosis present

## 2022-05-15 DIAGNOSIS — Z7951 Long term (current) use of inhaled steroids: Secondary | ICD-10-CM | POA: Diagnosis not present

## 2022-05-15 MED ORDER — PREDNISONE 20 MG PO TABS
60.0000 mg | ORAL_TABLET | Freq: Once | ORAL | Status: AC
  Administered 2022-05-15: 60 mg via ORAL
  Filled 2022-05-15: qty 3

## 2022-05-15 MED ORDER — ALBUTEROL SULFATE HFA 108 (90 BASE) MCG/ACT IN AERS
2.0000 | INHALATION_SPRAY | RESPIRATORY_TRACT | Status: DC | PRN
  Administered 2022-05-15 – 2022-05-16 (×2): 2 via RESPIRATORY_TRACT
  Filled 2022-05-15 (×2): qty 6.7

## 2022-05-15 NOTE — ED Triage Notes (Signed)
Pt states that he is having an asthma attack and has been 2 weeks, and out of his rescue  inhaler

## 2022-05-15 NOTE — ED Provider Triage Note (Signed)
Emergency Medicine Provider Triage Evaluation Note  Keith Henry , a 47 y.o. male  was evaluated in triage.  Pt complains of sob. Ran out of his rescue inhaler for the past week and having to use his nebulizer multiple times a day.  No fever, productive cough, chills, body aches  Review of Systems  Positive: As above Negative: As above  Physical Exam  BP (!) 123/98 (BP Location: Right Arm)   Pulse 92   Temp 98.7 F (37.1 C) (Oral)   Resp 20   SpO2 97%  Gen:   Awake, no distress   Resp:  Normal effort  MSK:   Moves extremities without difficulty  Other:  wheeze  Medical Decision Making  Medically screening exam initiated at 6:53 PM.  Appropriate orders placed.  Silviano Catania was informed that the remainder of the evaluation will be completed by another provider, this initial triage assessment does not replace that evaluation, and the importance of remaining in the ED until their evaluation is complete.     Fayrene Helper, PA-C 05/15/22 1853

## 2022-05-16 MED ORDER — PREDNISONE 10 MG (21) PO TBPK: ORAL_TABLET | Freq: Every day | ORAL | 0 refills | Status: DC

## 2022-05-16 MED ORDER — FLUTICASONE PROPIONATE HFA 44 MCG/ACT IN AERO
1.0000 | INHALATION_SPRAY | Freq: Two times a day (BID) | RESPIRATORY_TRACT | Status: DC
  Administered 2022-05-16: 1 via RESPIRATORY_TRACT
  Filled 2022-05-16: qty 10.6

## 2022-05-16 MED ORDER — IPRATROPIUM-ALBUTEROL 0.5-2.5 (3) MG/3ML IN SOLN
3.0000 mL | Freq: Once | RESPIRATORY_TRACT | Status: AC
  Administered 2022-05-16: 3 mL via RESPIRATORY_TRACT
  Filled 2022-05-16: qty 3

## 2022-05-16 NOTE — ED Provider Notes (Signed)
[place] EMERGENCY DEPARTMENT AT [facility] Provider Note   CSN: [id number] Arrival date & time: 05/15/22  1837     History  Chief Complaint  Patient presents with   Asthma    Keith Henry is a 47 y.o. male.   Asthma   47 year old male with past medical history significant for asthma He has been intubated once for an asthma exacerbation.  He has had multiple and multiple admissions over the past 6 months.  He states that he has been out of his albuterol inhaler because he needs to use this somewhat frequently.  He states he has been using it at least twice a day for the Past 2 weeks.  He also has been using a nebulizer.  Denies chest pain, hemoptysis, lightheadedness or dizziness.  He states he feels much improved after 2 puffs albuterol treatment that he was given in the waiting room and a dose of prednisone.  No recent surgeries, hospitalization, long travel, hemoptysis, estrogen containing OCP, cancer history.  No unilateral leg swelling.  No history of PE or VTE.     Home Medications Prior to Admission medications   Medication Sig Start Date End Date Taking? Authorizing Provider  predniSONE (STERAPRED UNI-PAK 21 TAB) 10 MG (21) TBPK tablet Take by mouth daily. Take 6 tabs by mouth daily  for 2 days, then 5 tabs for 2 days, then 4 tabs for 2 days, then 3 tabs for 2 days, 2 tabs for 2 days, then 1 tab by mouth daily for 2 days 05/16/22  Yes [place] Sylvan S, PA  albuterol (PROVENTIL) (2.5 MG/3ML) 0.083% nebulizer solution Inhale 3 mLs (2.5 mg total) by nebulization every 6 (six) hours as needed for wheezing or shortness of breath. 02/14/22   Nira Conn, MD  albuterol (VENTOLIN HFA) 108 (90 Base) MCG/ACT inhaler Inhale 2 puffs into the lungs every 4 (four) hours as needed for wheezing or shortness of breath. 01/10/22   Dorcas Carrow, MD  amoxicillin (AMOXIL) 500 MG capsule Take 1 capsule (500 mg total) by mouth 3 (three) times daily. 03/29/22   Antony Madura, PA-C  diclofenac (VOLTAREN) 50 MG EC tablet Take 1 tablet (50 mg total) by mouth 2 (two) times daily with a meal. 03/29/22   Antony Madura, PA-C  fluticasone-salmeterol (ADVAIR) 250-50 MCG/ACT AEPB Inhale 1 puff into the lungs in the morning and at bedtime. 01/10/22   Dorcas Carrow, MD  methocarbamol (ROBAXIN) 500 MG tablet Take 1 tablet (500 mg total) by mouth every 12 (twelve) hours as needed for muscle spasms. 03/29/22   Antony Madura, PA-C      Allergies    Other and Pork-derived products    Review of Systems   Review of Systems  Physical Exam Updated Vital Signs BP 135/87 (BP Location: Right Arm)   Pulse 87   Temp 98.7 F (37.1 C) (Oral)   Resp 18   SpO2 100%  Physical Exam Vitals and nursing note reviewed.  Constitutional:      General: He is not in acute distress. HENT:     Head: Normocephalic and atraumatic.     Nose: Nose normal.     Mouth/Throat:     Mouth: Mucous membranes are moist.  Eyes:     General: No scleral icterus. Cardiovascular:     Rate and Rhythm: Normal rate and regular rhythm.     Pulses: Normal pulses.     Heart sounds: Normal heart sounds.  Pulmonary:     Effort: Pulmonary  effort is normal. No respiratory distress.     Breath sounds: Wheezing present.     Comments: Tachypnea, faint inspiratory next-door wheezing, good air movement, speaking in full sentences Abdominal:     Palpations: Abdomen is soft.     Tenderness: There is no abdominal tenderness.  Musculoskeletal:     Cervical back: Normal range of motion.     Right lower leg: No edema.     Left lower leg: No edema.  Skin:    General: Skin is warm and dry.     Capillary Refill: Capillary refill takes less than 2 seconds.  Neurological:     Mental Status: He is alert. Mental status is at baseline.  Psychiatric:        Mood and Affect: Mood normal.        Behavior: Behavior normal.     ED Results / Procedures / Treatments   Labs (all labs ordered are listed, but only abnormal  results are displayed) Labs Reviewed - No data to display  EKG None  Radiology No results found.  Procedures Procedures    Medications Ordered in ED Medications  albuterol (VENTOLIN HFA) 108 (90 Base) MCG/ACT inhaler 2 puff (2 puffs Inhalation Given 05/16/22 0054)  fluticasone (FLOVENT HFA) 44 MCG/ACT inhaler 1 puff (1 puff Inhalation Given 05/16/22 0225)  predniSONE (DELTASONE) tablet 60 mg (60 mg Oral Given 05/15/22 1910)  ipratropium-albuterol (DUONEB) 0.5-2.5 (3) MG/3ML nebulizer solution 3 mL (3 mLs Nebulization Given 05/16/22 0204)    ED Course/ Medical Decision Making/ A&P                             Medical Decision Making Risk Prescription drug management.   47 year old male with past medical history significant for asthma He has been intubated once for an asthma exacerbation.  He has had multiple and multiple admissions over the past 6 months.  He states that he has been out of his albuterol inhaler because he needs to use this somewhat frequently.  He states he has been using it at least twice a day for the Past 2 weeks.  He also has been using a nebulizer.  Denies chest pain, hemoptysis, lightheadedness or dizziness.  He states he feels much improved after 2 puffs albuterol treatment that he was given in the waiting room and a dose of prednisone.  No recent surgeries, hospitalization, long travel, hemoptysis, estrogen containing OCP, cancer history.  No unilateral leg swelling.  No history of PE or VTE.   Patient without fever, hemoptysis, cough.  He is primarily indicating that he has been having some wheezing consistent with prior asthma exacerbations.  He has been admitted for asthma exacerbations multiple times over the past 6 months.  He states his symptoms usually get worse in the winter and in the spring when there is pollen.  He has not had a steroid inhaler in many years.  He does have faint inspiratory next-door wheezing no tachypnea or increased work of  breathing.  Does have slightly prolonged expiratory phase but speaking full sentences.  Patient given DuoNeb and prednisone p.o.   Patient reevaluated he feels much improved.  Given Flonase inhaler to use twice daily and recommend follow-up with PCP I gave him the information for the [facility] health wellness clinic.  He has normal vital signs since he is asymptomatic now.  Discharged with prednisone, fluticasone inhaler.  Return precautions discussed.  Final Clinical Impression(s) / ED Diagnoses Final  diagnoses:  Moderate persistent asthma with exacerbation    Rx / DC Orders ED Discharge Orders          Ordered    predniSONE (STERAPRED UNI-PAK 21 TAB) 10 MG (21) TBPK tablet  Daily        05/16/22 0155              Gailen Shelter, PA 05/16/22 0308    Glynn Octave, MD 05/16/22 [phone]

## 2022-05-16 NOTE — Discharge Instructions (Addendum)
Take prednisone as prescribed.  I have given you a steroid inhaler you will need to use this 1 puff twice daily  This should  decrease the amount of albuterol that you need to use.  I have given you the information for a primary care office follow-up with.

## 2022-07-06 ENCOUNTER — Emergency Department (HOSPITAL_COMMUNITY): Payer: 59

## 2022-07-06 ENCOUNTER — Emergency Department (HOSPITAL_COMMUNITY)
Admission: EM | Admit: 2022-07-06 | Discharge: 2022-07-06 | Disposition: A | Discharge status: Home / Self Care | Payer: 59 | Attending: Emergency Medicine | Admitting: Emergency Medicine

## 2022-07-06 ENCOUNTER — Other Ambulatory Visit: Payer: Self-pay

## 2022-07-06 ENCOUNTER — Encounter (HOSPITAL_COMMUNITY): Payer: Self-pay | Admitting: Emergency Medicine

## 2022-07-06 DIAGNOSIS — R0602 Shortness of breath: Secondary | ICD-10-CM | POA: Diagnosis present

## 2022-07-06 DIAGNOSIS — J4541 Moderate persistent asthma with (acute) exacerbation: Secondary | ICD-10-CM | POA: Diagnosis not present

## 2022-07-06 MED ORDER — FLUTICASONE-SALMETEROL 250-50 MCG/ACT IN AEPB: 1.0000 | INHALATION_SPRAY | Freq: Two times a day (BID) | RESPIRATORY_TRACT | 0 refills | Status: DC

## 2022-07-06 MED ORDER — PREDNISONE 10 MG PO TABS: 40.0000 mg | ORAL_TABLET | Freq: Every day | ORAL | 0 refills | Status: AC

## 2022-07-06 MED ORDER — ALBUTEROL SULFATE HFA 108 (90 BASE) MCG/ACT IN AERS: 2.0000 | INHALATION_SPRAY | RESPIRATORY_TRACT | 0 refills | Status: DC | PRN

## 2022-07-06 MED ORDER — ALBUTEROL SULFATE (2.5 MG/3ML) 0.083% IN NEBU: 2.5000 mg | INHALATION_SOLUTION | Freq: Four times a day (QID) | RESPIRATORY_TRACT | 0 refills | Status: DC | PRN

## 2022-07-06 MED ORDER — IPRATROPIUM BROMIDE 0.02 % IN SOLN
0.5000 mg | Freq: Once | RESPIRATORY_TRACT | Status: AC
  Administered 2022-07-06: 0.5 mg via RESPIRATORY_TRACT
  Filled 2022-07-06: qty 2.5

## 2022-07-06 MED ORDER — ALBUTEROL SULFATE (2.5 MG/3ML) 0.083% IN NEBU
10.0000 mg | INHALATION_SOLUTION | RESPIRATORY_TRACT | Status: DC
  Administered 2022-07-06: 10 mg via RESPIRATORY_TRACT
  Filled 2022-07-06: qty 12

## 2022-07-06 MED ORDER — ALBUTEROL SULFATE HFA 108 (90 BASE) MCG/ACT IN AERS
2.0000 | INHALATION_SPRAY | Freq: Once | RESPIRATORY_TRACT | Status: AC
  Administered 2022-07-06: 2 via RESPIRATORY_TRACT
  Filled 2022-07-06: qty 6.7

## 2022-07-06 MED ORDER — PREDNISONE 20 MG PO TABS
60.0000 mg | ORAL_TABLET | Freq: Once | ORAL | Status: AC
  Administered 2022-07-06: 60 mg via ORAL
  Filled 2022-07-06: qty 3

## 2022-07-06 NOTE — ED Triage Notes (Signed)
Pt in with cough, sob and dizziness. Hx of asthma, states his inhaler at home is not helping, and he is coughing up yellow sputum. Denies fevers

## 2022-07-06 NOTE — ED Provider Notes (Signed)
[place] EMERGENCY DEPARTMENT AT [facility] Provider Note  CSN: [id number] Arrival date & time: 07/06/22 0207  Chief Complaint(s) Cough and Shortness of Breath  HPI Keith Henry is a 47 y.o. male with a past medical history listed below who presents to the emergency department with 3 weeks of intermittent and fluctuating shortness of breath, cough and wheezing.  Patient ran out of his albuterol nebs and inhaler 2 to 3 weeks ago.  Patient is still smoking.  He denies any fevers.  No chest pain.  No lower extremity edema.  The history is provided by the patient.    Past Medical History Past Medical History:  Diagnosis Date   Asthma    Patient Active Problem List   Diagnosis Date Noted   Influenza A 01/08/2022   Asthma 01/01/2022   Mild persistent asthma 11/14/2021   Acute asthma exacerbation 11/12/2021   Hyperbilirubinemia 11/12/2021   Mild protein malnutrition (HCC) 11/12/2021   Tobacco use 11/12/2021   Asthma exacerbation 02/08/2021   Home Medication(s) Prior to Admission medications   Medication Sig Start Date End Date Taking? Authorizing Provider  predniSONE (DELTASONE) 10 MG tablet Take 4 tablets (40 mg total) by mouth daily for 4 days. 07/06/22 07/10/22 Yes Efrata Brunner, Amadeo Garnet, MD  albuterol (PROVENTIL) (2.5 MG/3ML) 0.083% nebulizer solution Inhale 3 mLs (2.5 mg total) by nebulization every 6 (six) hours as needed for wheezing or shortness of breath. 07/06/22   Nira Conn, MD  albuterol (VENTOLIN HFA) 108 (90 Base) MCG/ACT inhaler Inhale 2 puffs into the lungs every 4 (four) hours as needed for wheezing or shortness of breath. 07/06/22   Lakin Romer, Amadeo Garnet, MD  amoxicillin (AMOXIL) 500 MG capsule Take 1 capsule (500 mg total) by mouth 3 (three) times daily. 03/29/22   Antony Madura, PA-C  diclofenac (VOLTAREN) 50 MG EC tablet Take 1 tablet (50 mg total) by mouth 2 (two) times daily with a meal. 03/29/22   Antony Madura, PA-C  fluticasone-salmeterol  (ADVAIR) 250-50 MCG/ACT AEPB Inhale 1 puff into the lungs in the morning and at bedtime. 07/06/22   Lillith Mcneff, Amadeo Garnet, MD  methocarbamol (ROBAXIN) 500 MG tablet Take 1 tablet (500 mg total) by mouth every 12 (twelve) hours as needed for muscle spasms. 03/29/22   Antony Madura, PA-C                                                                                                                                    Allergies Other and Pork-derived products  Review of Systems Review of Systems As noted in HPI  Physical Exam Vital Signs  I have reviewed the triage vital signs BP 113/75   Pulse (!) 112   Temp 98.7 F (37.1 C)   Resp 20   Wt 95.3 kg   SpO2 97%   BMI 30.15 kg/m   Physical Exam Vitals reviewed.  Constitutional:      General: He  is not in acute distress.    Appearance: He is well-developed. He is not diaphoretic.  HENT:     Head: Normocephalic and atraumatic.     Nose: Nose normal.  Eyes:     General: No scleral icterus.       Right eye: No discharge.        Left eye: No discharge.     Conjunctiva/sclera: Conjunctivae normal.     Pupils: Pupils are equal, round, and reactive to light.  Cardiovascular:     Rate and Rhythm: Normal rate and regular rhythm.     Heart sounds: No murmur heard.    No friction rub. No gallop.  Pulmonary:     Effort: Pulmonary effort is normal. Tachypnea present. No respiratory distress.     Breath sounds: No stridor. Wheezing (diffuse inspiratory and expiratory) present. No rales.  Abdominal:     General: There is no distension.     Palpations: Abdomen is soft.     Tenderness: There is no abdominal tenderness.  Musculoskeletal:        General: No tenderness.     Cervical back: Normal range of motion and neck supple.  Skin:    General: Skin is warm and dry.     Findings: No erythema or rash.  Neurological:     Mental Status: He is alert and oriented to person, place, and time.     ED Results and Treatments Labs (all labs  ordered are listed, but only abnormal results are displayed) Labs Reviewed - No data to display                                                                                                                       EKG  EKG Interpretation  Date/Time:  Sunday July 06 2022 02:06:02 EDT Ventricular Rate:  88 PR Interval:  142 QRS Duration: 80 QT Interval:  354 QTC Calculation: 428 R Axis:   56 Text Interpretation: Normal sinus rhythm Normal ECG Confirmed by Zadie Rhine ([id number]) on 07/06/2022 2:24:32 AM       Radiology DG Chest 2 View  Result Date: 07/06/2022 CLINICAL DATA:  Cough and shortness of breath. Dizziness. History of asthma. EXAM: CHEST - 2 VIEW COMPARISON:  Radiograph 03/11/2022 FINDINGS: The cardiomediastinal contours are normal. Mild bronchial thickening. Pulmonary vasculature is normal. No consolidation, pleural effusion, or pneumothorax. Remote right clavicle fracture. No acute osseous abnormalities are seen. IMPRESSION: Mild bronchial thickening, can be seen with asthma or bronchitis. Electronically Signed   By: Narda Rutherford M.D.   On: 07/06/2022 03:13    Medications Ordered in ED Medications  albuterol (PROVENTIL) (2.5 MG/3ML) 0.083% nebulizer solution 10 mg (0 mg Nebulization Stopped 07/06/22 0708)  predniSONE (DELTASONE) tablet 60 mg (60 mg Oral Given 07/06/22 0549)  ipratropium (ATROVENT) nebulizer solution 0.5 mg (0.5 mg Nebulization Given 07/06/22 0551)  albuterol (VENTOLIN HFA) 108 (90 Base) MCG/ACT inhaler 2 puff (2 puffs Inhalation Given 07/06/22 0550)   Procedures Procedures  (including critical  care time) Medical Decision Making / ED Course   Medical Decision Making Amount and/or Complexity of Data Reviewed Radiology: ordered and independent interpretation performed. Decision-making details documented in ED Course.  Risk Prescription drug management.    Shortness of breath consistent with asthma exacerbation.  Chest x-ray without evidence of pneumonia,  pneumothorax, pulmonary edema or pleural effusions.  Patient treated with continuous DuoNeb, albuterol inhaler and steroids.  On reassessment, patient is work of breathing and wheezing had improved.  States he feels much better.  Patient counseled on smoking cessation.  Refills for his medication filled.    Final Clinical Impression(s) / ED Diagnoses Final diagnoses:  Moderate persistent asthma with exacerbation   The patient appears reasonably screened and/or stabilized for discharge and I doubt any other medical condition or other [facility] requiring further screening, evaluation, or treatment in the ED at this time. I have discussed the findings, Dx and Tx plan with the patient/family who expressed understanding and agree(s) with the plan. Discharge instructions discussed at length. The patient/family was given strict return precautions who verbalized understanding of the instructions. No further questions at time of discharge.  Disposition: Discharge  Condition: Good  ED Discharge Orders          Ordered    fluticasone-salmeterol (ADVAIR) 250-50 MCG/ACT AEPB  2 times daily        07/06/22 0545    albuterol (VENTOLIN HFA) 108 (90 Base) MCG/ACT inhaler  Every 4 hours PRN        07/06/22 0545    predniSONE (DELTASONE) 10 MG tablet  Daily        07/06/22 0545    albuterol (PROVENTIL) (2.5 MG/3ML) 0.083% nebulizer solution  Every 6 hours PRN        07/06/22 0545              Follow Up: Primary care provider  Schedule an appointment as soon as possible for a visit  if you do not have a primary care physician, contact [facility] at [phone] for referral    This chart was dictated using voice recognition software.  Despite best efforts to proofread,  errors can occur which can change the documentation meaning.    Nira Conn, MD 07/06/22 [phone]

## 2022-07-29 IMAGING — DX DG CHEST 2V
2 series · 2 of 2 positions shown · non-contrast
Comparison: Chest x-ray dated November 15, 2020.

CLINICAL DATA: Cough, shortness of breath.

EXAM:
CHEST - 2 VIEW

[chest pa]
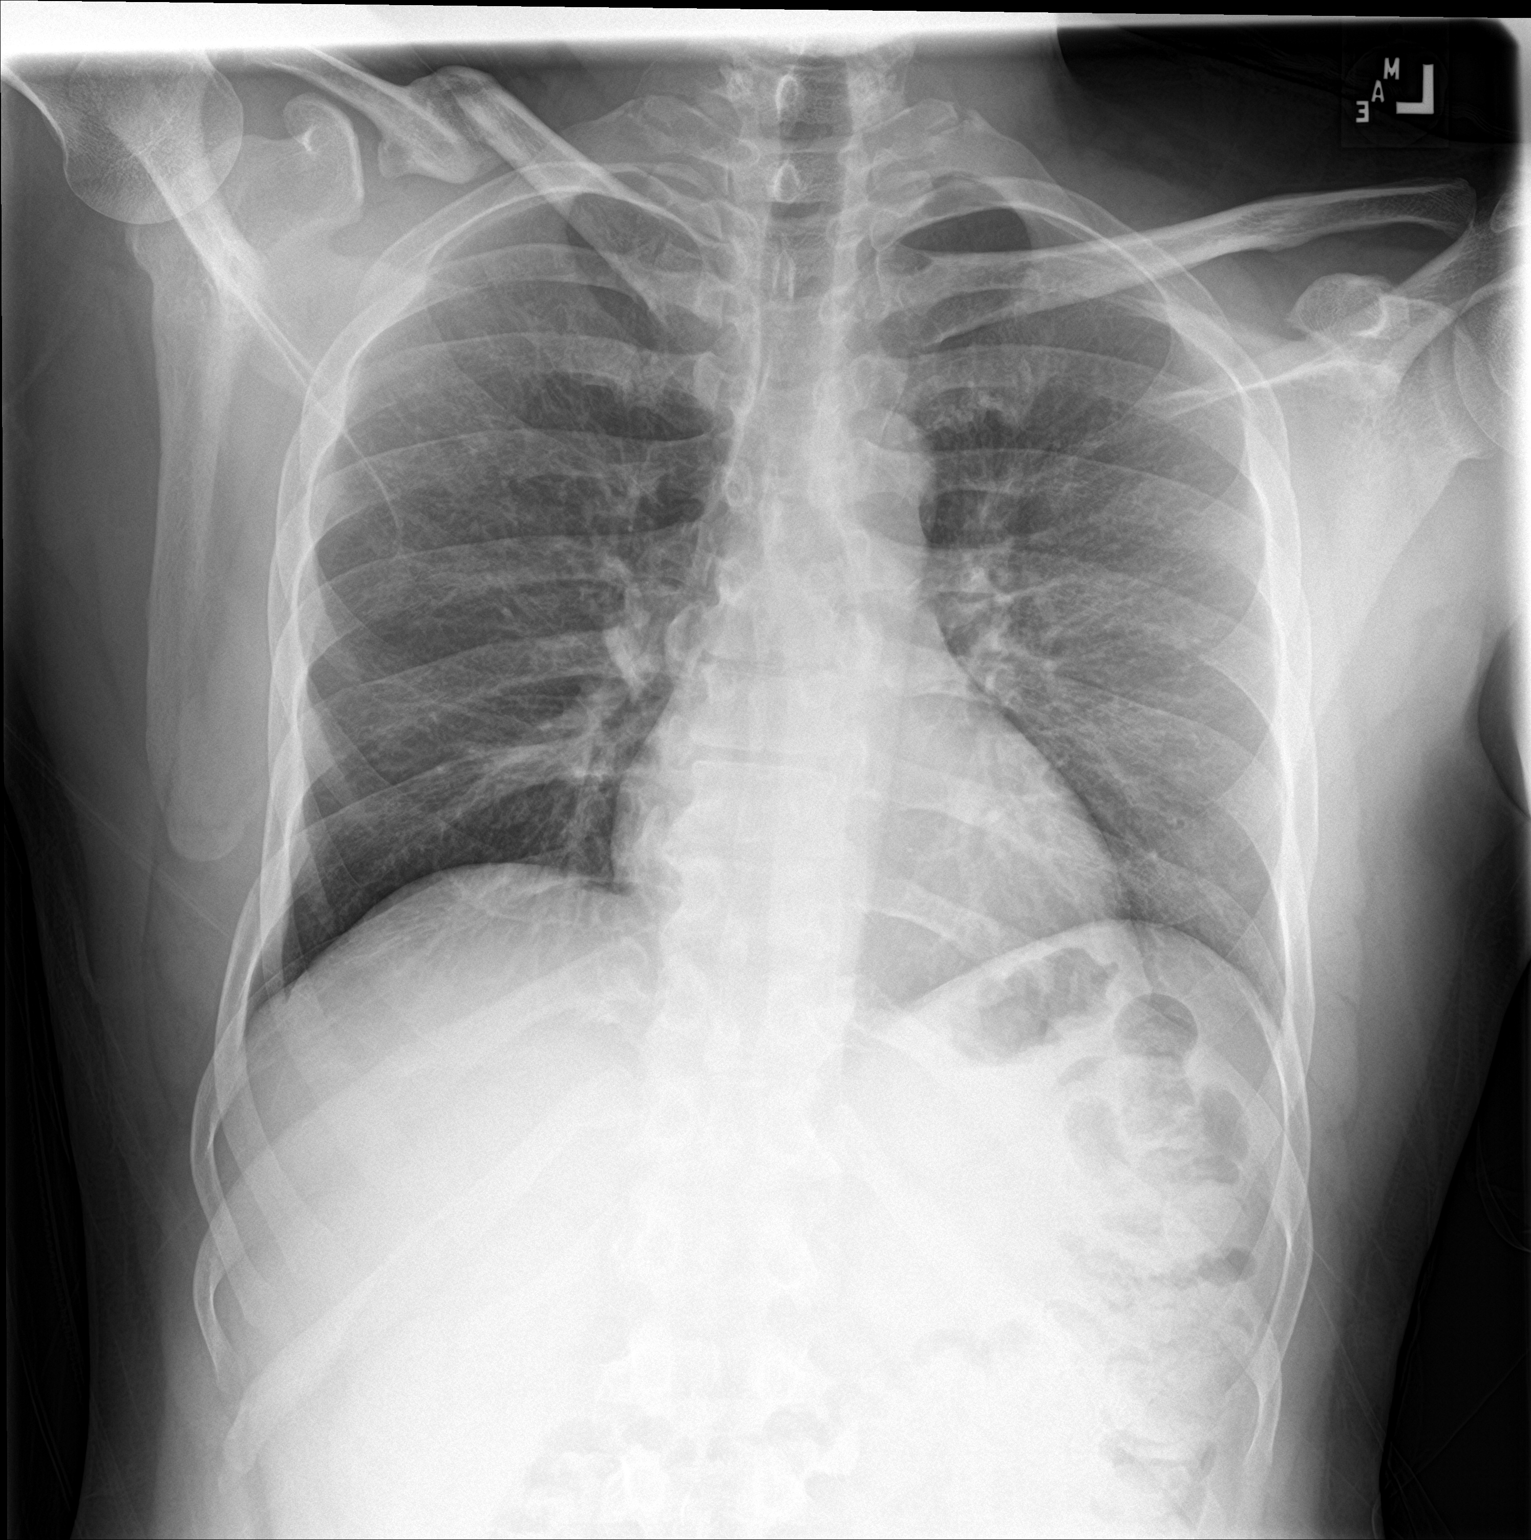

[chest lat]
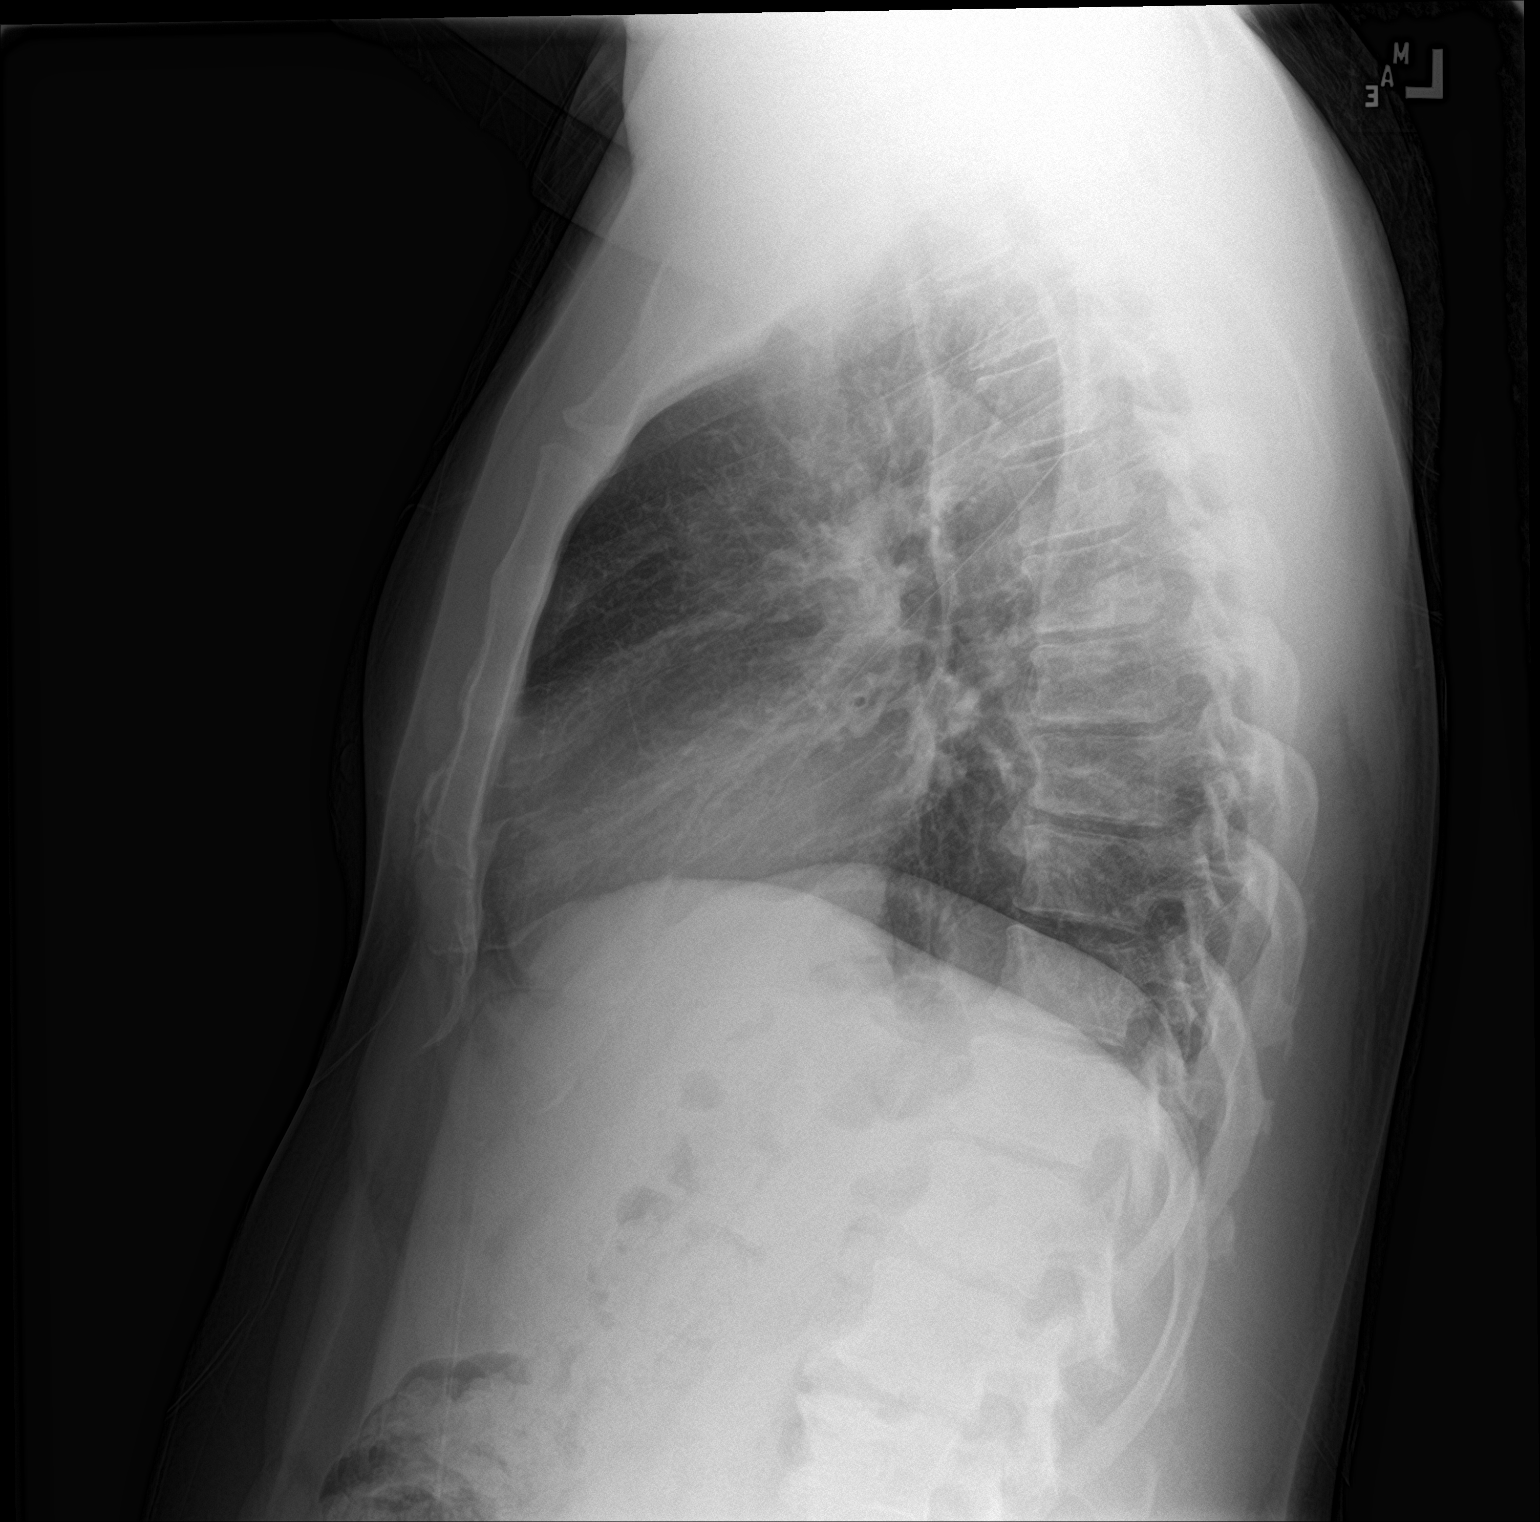

[2 of 2 positions shown; findings below may reference images not displayed]

FINDINGS: The heart size and mediastinal contours are within normal limits.
Normal pulmonary vascularity. Chronic, mildly coarsened interstitial
markings and central peribronchial thickening, likely
smoking-related. No focal consolidation, pleural effusion, or
pneumothorax. No acute osseous abnormality. Old healed right mid
clavicle fracture again noted.
IMPRESSION: 1. No acute cardiopulmonary disease.

## 2022-08-03 ENCOUNTER — Emergency Department (HOSPITAL_COMMUNITY)
Admission: EM | Admit: 2022-08-03 | Discharge: 2022-08-03 | Disposition: A | Discharge status: Home / Self Care | Payer: 59 | Attending: Emergency Medicine | Admitting: Emergency Medicine

## 2022-08-03 ENCOUNTER — Other Ambulatory Visit: Payer: Self-pay

## 2022-08-03 ENCOUNTER — Emergency Department (HOSPITAL_COMMUNITY): Payer: 59

## 2022-08-03 ENCOUNTER — Encounter (HOSPITAL_COMMUNITY): Payer: Self-pay | Admitting: Emergency Medicine

## 2022-08-03 DIAGNOSIS — R079 Chest pain, unspecified: Secondary | ICD-10-CM | POA: Diagnosis present

## 2022-08-03 DIAGNOSIS — Z87891 Personal history of nicotine dependence: Secondary | ICD-10-CM | POA: Diagnosis not present

## 2022-08-03 DIAGNOSIS — J4541 Moderate persistent asthma with (acute) exacerbation: Secondary | ICD-10-CM | POA: Diagnosis not present

## 2022-08-03 DIAGNOSIS — Z7951 Long term (current) use of inhaled steroids: Secondary | ICD-10-CM | POA: Insufficient documentation

## 2022-08-03 LAB — BASIC METABOLIC PANEL
Anion gap: 6 (ref 5–15)
BUN: 7 mg/dL (ref 6–20)
CO2: 25 mmol/L (ref 22–32)
Calcium: 8 mg/dL — ABNORMAL LOW (ref 8.9–10.3)
Chloride: 106 mmol/L (ref 98–111)
Creatinine, Ser: 1.12 mg/dL (ref 0.61–1.24)
GFR, Estimated: >60 mL/min (ref >60)
Glucose, Bld: 103 mg/dL — ABNORMAL HIGH (ref 70–99)
Potassium: 4.1 mmol/L (ref 3.5–5.1)
Sodium: 137 mmol/L (ref 135–145)

## 2022-08-03 LAB — RAPID URINE DRUG SCREEN, HOSP PERFORMED
Amphetamines: NOT DETECTED
Barbiturates: NOT DETECTED
Benzodiazepines: NOT DETECTED
Cocaine: POSITIVE — AB
Opiates: NOT DETECTED
Tetrahydrocannabinol: POSITIVE — AB

## 2022-08-03 LAB — ETHANOL: Alcohol, Ethyl (B): <10 mg/dL (ref <10)

## 2022-08-03 LAB — HEPATIC FUNCTION PANEL
ALT: 12 U/L (ref 0–44)
AST: 21 U/L (ref 15–41)
Albumin: 3.2 g/dL — ABNORMAL LOW (ref 3.5–5.0)
Alkaline Phosphatase: 90 U/L (ref 38–126)
Bilirubin, Direct: <0.1 mg/dL (ref 0.0–0.2)
Total Bilirubin: 0.6 mg/dL (ref 0.3–1.2)
Total Protein: 5.9 g/dL — ABNORMAL LOW (ref 6.5–8.1)

## 2022-08-03 LAB — AMMONIA: Ammonia: 35 umol/L (ref 9–35)

## 2022-08-03 LAB — TROPONIN I (HIGH SENSITIVITY)
Troponin I (High Sensitivity): 4 ng/L (ref <18)
Troponin I (High Sensitivity): 2 ng/L (ref <18)

## 2022-08-03 MED ORDER — IPRATROPIUM-ALBUTEROL 0.5-2.5 (3) MG/3ML IN SOLN
3.0000 mL | Freq: Once | RESPIRATORY_TRACT | Status: AC
  Administered 2022-08-03: 3 mL via RESPIRATORY_TRACT
  Filled 2022-08-03: qty 3

## 2022-08-03 MED ORDER — METHYLPREDNISOLONE SODIUM SUCC 125 MG IJ SOLR
125.0000 mg | Freq: Once | INTRAMUSCULAR | Status: AC
  Administered 2022-08-03: 125 mg via INTRAVENOUS
  Filled 2022-08-03: qty 2

## 2022-08-03 MED ORDER — ALBUTEROL SULFATE HFA 108 (90 BASE) MCG/ACT IN AERS
2.0000 | INHALATION_SPRAY | Freq: Once | RESPIRATORY_TRACT | Status: AC
  Administered 2022-08-03: 2 via RESPIRATORY_TRACT
  Filled 2022-08-03: qty 6.7

## 2022-08-03 NOTE — ED Notes (Signed)
Assumed care of patient here c/o cp and nausea since 0700 this morning. Patient has hx of asthma is sating 98% on ra and lungs sounds are currently clear in all fields. Patient is sleepy at present time but a/o x 4 respirations even and non labored is requesting to be fed again. Urine sample collected and sent per provider orders. Nausea and CP have resolved per pt and he is requesting to go home

## 2022-08-03 NOTE — ED Triage Notes (Signed)
Patient arrived via [facility] with complaints of chest  and shortness, associated with nausea, onset this morning around 0700. EMS given duoneb, 324mg  aspirin, and sublingual nitroglycerin x1. Patient alerts and oriented x4 arrival to ED.

## 2022-08-03 NOTE — ED Provider Notes (Signed)
[place] EMERGENCY DEPARTMENT AT [facility] Provider Note   CSN: [id number] Arrival date & time: 08/03/22  1439     History  Chief Complaint  Patient presents with   Chest Pain    Keith Henry is a 47 y.o. male with history of asthma, tobacco use, who presents emergency department complaining of chest pain and shortness of breath.  Started around 7 AM this morning.  Associated nausea.  EMS gave DuoNeb, aspirin, and sublingual nitroglycerin x 1.  Upon my interview, having difficulty keeping the patient awake to answer questions and participate in examination.  He is able to tell me that his symptoms started today, and they feel somewhat similar to his asthma.  He endorses feeling very tired today.   Chest Pain Associated symptoms: shortness of breath        Home Medications Prior to Admission medications   Medication Sig Start Date End Date Taking? Authorizing Provider  albuterol (PROVENTIL) (2.5 MG/3ML) 0.083% nebulizer solution Inhale 3 mLs (2.5 mg total) by nebulization every 6 (six) hours as needed for wheezing or shortness of breath. 07/06/22   Nira Conn, MD  albuterol (VENTOLIN HFA) 108 (90 Base) MCG/ACT inhaler Inhale 2 puffs into the lungs every 4 (four) hours as needed for wheezing or shortness of breath. 07/06/22   Cardama, Amadeo Garnet, MD  amoxicillin (AMOXIL) 500 MG capsule Take 1 capsule (500 mg total) by mouth 3 (three) times daily. 03/29/22   Antony Madura, PA-C  diclofenac (VOLTAREN) 50 MG EC tablet Take 1 tablet (50 mg total) by mouth 2 (two) times daily with a meal. 03/29/22   Antony Madura, PA-C  fluticasone-salmeterol (ADVAIR) 250-50 MCG/ACT AEPB Inhale 1 puff into the lungs in the morning and at bedtime. 07/06/22   Cardama, Amadeo Garnet, MD  methocarbamol (ROBAXIN) 500 MG tablet Take 1 tablet (500 mg total) by mouth every 12 (twelve) hours as needed for muscle spasms. 03/29/22   Antony Madura, PA-C      Allergies    Other and Pork-derived  products    Review of Systems   Review of Systems  Respiratory:  Positive for chest tightness and shortness of breath.   Cardiovascular:  Positive for chest pain.  All other systems reviewed and are negative.   Physical Exam Updated Vital Signs BP 135/81   Pulse 81   Temp 98.3 F (36.8 C) (Oral)   Resp 20   Ht 5\' 10"  (1.778 m)   Wt 95.3 kg   SpO2 98%   BMI 30.15 kg/m  Physical Exam Vitals and nursing note reviewed.  Constitutional:      General: He is sleeping.     Appearance: Normal appearance.     Comments: Patient moderately arousable, does not stay awake long enough to provide further history  HENT:     Head: Normocephalic and atraumatic.  Eyes:     Conjunctiva/sclera: Conjunctivae normal.  Cardiovascular:     Rate and Rhythm: Normal rate and regular rhythm.  Pulmonary:     Effort: Pulmonary effort is normal. No respiratory distress.     Breath sounds: Wheezing present.  Abdominal:     General: There is no distension.     Palpations: Abdomen is soft.     Tenderness: There is no abdominal tenderness.  Skin:    General: Skin is warm and dry.  Neurological:     General: No focal deficit present.     ED Results / Procedures / Treatments   Labs (all labs  ordered are listed, but only abnormal results are displayed) Labs Reviewed  BASIC METABOLIC PANEL - Abnormal; Notable for the following components:      Result Value   Glucose, Bld 103 (*)    Calcium 8.0 (*)    All other components within normal limits  HEPATIC FUNCTION PANEL - Abnormal; Notable for the following components:   Total Protein 5.9 (*)    Albumin 3.2 (*)    All other components within normal limits  ETHANOL  AMMONIA  RAPID URINE DRUG SCREEN, HOSP PERFORMED  CBG MONITORING, ED  TROPONIN I (HIGH SENSITIVITY)  TROPONIN I (HIGH SENSITIVITY)    EKG EKG Interpretation Date/Time:  Sunday August 03 2022 14:49:01 EDT Ventricular Rate:  84 PR Interval:  144 QRS Duration:  86 QT  Interval:  358 QTC Calculation: 424 R Axis:   47  Text Interpretation: Sinus rhythm RSR' in V1 or V2, right VCD or RVH Confirmed by Ernie Avena ([id number]) on 08/03/2022 4:55:44 PM  Radiology CT Head Wo Contrast  Result Date: 08/03/2022 CLINICAL DATA:  Mental status change EXAM: CT HEAD WITHOUT CONTRAST TECHNIQUE: Contiguous axial images were obtained from the base of the skull through the vertex without intravenous contrast. RADIATION DOSE REDUCTION: This exam was performed according to the departmental dose-optimization program which includes automated exposure control, adjustment of the mA and/or kV according to patient size and/or use of iterative reconstruction technique. COMPARISON:  CT brain 05/25/2008, 09/14/2016 report FINDINGS: Brain: No evidence of acute infarction, hemorrhage, hydrocephalus, extra-axial collection or mass lesion/mass effect. Vascular: No hyperdense vessel or unexpected calcification. Skull: Normal. Negative for fracture or focal lesion. Sinuses/Orbits: No acute finding. Other: None IMPRESSION: Negative non contrasted CT appearance of the brain. Electronically Signed   By: Jasmine Pang M.D.   On: 08/03/2022 18:09   DG Chest 2 View  Result Date: 08/03/2022 CLINICAL DATA:  Chest pain and shortness of breath. EXAM: CHEST - 2 VIEW COMPARISON:  Chest radiographs 07/06/2022 FINDINGS: The heart size and mediastinal contours are within normal limits. Both lungs are clear. The visualized skeletal structures are unremarkable. IMPRESSION: No active cardiopulmonary disease. Electronically Signed   By: Neita Garnet M.D.   On: 08/03/2022 15:23    Procedures Procedures    Medications Ordered in ED Medications  albuterol (VENTOLIN HFA) 108 (90 Base) MCG/ACT inhaler 2 puff (has no administration in time range)  methylPREDNISolone sodium succinate (SOLU-MEDROL) 125 mg/2 mL injection 125 mg (125 mg Intravenous Given 08/03/22 1811)  ipratropium-albuterol (DUONEB) 0.5-2.5 (3) MG/3ML  nebulizer solution 3 mL (3 mLs Nebulization Given 08/03/22 1811)    ED Course/ Medical Decision Making/ A&P                             Medical Decision Making Amount and/or Complexity of Data Reviewed Labs: ordered. Radiology: ordered.   This patient is a 47 y.o. male  who presents to the ED for concern of chest pain and shortness of breath.   Differential diagnoses prior to evaluation: The emergent differential diagnosis includes, but is not limited to,  CHF, pericardial effusion/tamponade, arrhythmias, ACS, COPD, asthma, bronchitis, pneumonia, pneumothorax, PE, anemia. This is not an exhaustive differential.   Past Medical History / Co-morbidities / Social History: asthma, tobacco use  Additional history: Chart reviewed. Pertinent results include: History of prior admissions for asthma, most recently in December 2023.  Most recent ER visit for same was about 3 weeks ago.  Physical Exam: Physical exam  performed. The pertinent findings include: Patient very lethargic on exam, easily arousable, but difficulty staying awake.  Wheezing noted to lung auscultation,, normal respiratory effort.  No evidence of peripheral edema.  Lab Tests/Imaging studies: I personally interpreted labs/imaging and the pertinent results include: CBC not obtained per staff.  BMP and hepatic function panel unremarkable.  Negative ethanol.  Normal troponins.  Normal ammonia.  Chest x-ray without acute abnormalities.  CT head obtained in the setting of patient's lethargy, shows no acute intracranial abnormalities. I agree with the radiologist interpretation.  Cardiac monitoring: EKG obtained and interpreted by myself and attending physician which shows: Sinus rhythm   Medications: I ordered medication including Adderall inhaler, DuoNeb, Solu-Medrol.  I have reviewed the patients home medicines and have made adjustments as needed.  Upon reevaluation patient is awake and talking.  States that he was very tired  upon ER arrival and was having difficulty staying awake.  He feels his breathing is significantly better, and he is ready to go home.   Disposition: After consideration of the diagnostic results and the patients response to treatment, I feel that emergency department workup does not suggest an emergent condition requiring admission or immediate intervention beyond what has been performed at this time. The plan is: Discharge to home with symptomatic management of asthma exacerbation.  Patient states that he has not been able to pick up the inhaler or nebulizer that was sent to the pharmacy during his last ER visit, due to financial concerns.  He will try to pick them up as soon as he has the money to do so.  I gave him an inhaler in the ER that he can take home with him.  His workup is overall very reassuring, and he is feeling much better.  I initially had concerns of lethargy, and difficulty participating exam.  Patient states that he has been working long hours, and was very tired.  He is awake and cooperative on reevaluation.  The patient is safe for discharge and has been instructed to return immediately for worsening symptoms, change in symptoms or any other concerns.  Final Clinical Impression(s) / ED Diagnoses Final diagnoses:  Moderate persistent asthma with acute exacerbation    Rx / DC Orders ED Discharge Orders     None      Portions of this report may have been transcribed using voice recognition software. Every effort was made to ensure accuracy; however, inadvertent computerized transcription errors may be present.    Jeanella Flattery 08/03/22 1943    Ernie Avena, MD 08/03/22 2019

## 2022-08-03 NOTE — Discharge Instructions (Signed)
You were seen in emergency department today for chest pain and shortness of breath.  As we discussed your lab work and chest x-ray looked reassuring today.  We gave you a breathing treatment and some steroids, and we gave you an inhaler to take home.  At your last ER visit the provider had sent prescriptions for nebulizer solution and inhaler to the [facility] on [address].  Please pick these up when you are available.  Continue to monitor how you're doing and return to the ER for new or worsening symptoms.

## 2022-09-03 ENCOUNTER — Emergency Department (HOSPITAL_COMMUNITY): Payer: 59

## 2022-09-03 ENCOUNTER — Other Ambulatory Visit: Payer: Self-pay

## 2022-09-03 ENCOUNTER — Emergency Department (HOSPITAL_COMMUNITY)
Admission: EM | Admit: 2022-09-03 | Discharge: 2022-09-03 | Disposition: A | Discharge status: Home / Self Care | Payer: 59 | Attending: Emergency Medicine | Admitting: Emergency Medicine

## 2022-09-03 DIAGNOSIS — R0602 Shortness of breath: Secondary | ICD-10-CM | POA: Diagnosis present

## 2022-09-03 DIAGNOSIS — J4521 Mild intermittent asthma with (acute) exacerbation: Secondary | ICD-10-CM | POA: Insufficient documentation

## 2022-09-03 DIAGNOSIS — Z87891 Personal history of nicotine dependence: Secondary | ICD-10-CM | POA: Diagnosis not present

## 2022-09-03 LAB — BRAIN NATRIURETIC PEPTIDE: B Natriuretic Peptide: 7.6 pg/mL (ref 0.0–100.0)

## 2022-09-03 LAB — I-STAT VENOUS BLOOD GAS, ED
Acid-Base Excess: 0 mmol/L (ref 0.0–2.0)
Bicarbonate: 24.4 mmol/L (ref 20.0–28.0)
Calcium, Ion: 1.08 mmol/L — ABNORMAL LOW (ref 1.15–1.40)
HCT: 37 % — ABNORMAL LOW (ref 39.0–52.0)
Hemoglobin: 12.6 g/dL — ABNORMAL LOW (ref 13.0–17.0)
O2 Saturation: 90 %
Potassium: 3.5 mmol/L (ref 3.5–5.1)
Sodium: 141 mmol/L (ref 135–145)
TCO2: 26 mmol/L (ref 22–32)
pCO2, Ven: 38.6 mmHg — ABNORMAL LOW (ref 44–60)
pH, Ven: 7.409 (ref 7.25–7.43)
pO2, Ven: 58 mmHg — ABNORMAL HIGH (ref 32–45)

## 2022-09-03 LAB — CBC WITH DIFFERENTIAL/PLATELET
Abs Immature Granulocytes: 0.03 10*3/uL (ref 0.00–0.07)
Basophils Absolute: 0.1 10*3/uL (ref 0.0–0.1)
Basophils Relative: 2 %
Eosinophils Absolute: 0.5 10*3/uL (ref 0.0–0.5)
Eosinophils Relative: 6 %
HCT: 37.3 % — ABNORMAL LOW (ref 39.0–52.0)
Hemoglobin: 12.6 g/dL — ABNORMAL LOW (ref 13.0–17.0)
Immature Granulocytes: 0 %
Lymphocytes Relative: 22 %
Lymphs Abs: 2 10*3/uL (ref 0.7–4.0)
MCH: 33.1 pg (ref 26.0–34.0)
MCHC: 33.8 g/dL (ref 30.0–36.0)
MCV: 97.9 fL (ref 80.0–100.0)
Monocytes Absolute: 0.7 10*3/uL (ref 0.1–1.0)
Monocytes Relative: 8 %
Neutro Abs: 5.6 10*3/uL (ref 1.7–7.7)
Neutrophils Relative %: 62 %
Platelets: 283 10*3/uL (ref 150–400)
RBC: 3.81 MIL/uL — ABNORMAL LOW (ref 4.22–5.81)
RDW: 12.4 % (ref 11.5–15.5)
WBC: 8.9 10*3/uL (ref 4.0–10.5)
nRBC: 0 % (ref 0.0–0.2)

## 2022-09-03 LAB — BASIC METABOLIC PANEL
Anion gap: 9 (ref 5–15)
BUN: 13 mg/dL (ref 6–20)
CO2: 23 mmol/L (ref 22–32)
Calcium: 8.2 mg/dL — ABNORMAL LOW (ref 8.9–10.3)
Chloride: 107 mmol/L (ref 98–111)
Creatinine, Ser: 1.07 mg/dL (ref 0.61–1.24)
GFR, Estimated: >60 mL/min (ref >60)
Glucose, Bld: 119 mg/dL — ABNORMAL HIGH (ref 70–99)
Potassium: 3.7 mmol/L (ref 3.5–5.1)
Sodium: 139 mmol/L (ref 135–145)

## 2022-09-03 MED ORDER — ALBUTEROL SULFATE HFA 108 (90 BASE) MCG/ACT IN AERS
1.0000 | INHALATION_SPRAY | RESPIRATORY_TRACT | Status: DC | PRN
  Administered 2022-09-03: 2 via RESPIRATORY_TRACT
  Filled 2022-09-03: qty 6.7

## 2022-09-03 MED ORDER — AEROCHAMBER Z-STAT PLUS/MEDIUM MISC
1.0000 | Freq: Once | Status: AC
  Administered 2022-09-03: 1
  Filled 2022-09-03: qty 1

## 2022-09-03 MED ORDER — FLUTICASONE-SALMETEROL 250-50 MCG/ACT IN AEPB: 1.0000 | INHALATION_SPRAY | Freq: Two times a day (BID) | RESPIRATORY_TRACT | 0 refills | Status: AC

## 2022-09-03 MED ORDER — ALBUTEROL SULFATE (2.5 MG/3ML) 0.083% IN NEBU: 2.5000 mg | INHALATION_SOLUTION | Freq: Four times a day (QID) | RESPIRATORY_TRACT | 0 refills | Status: AC | PRN

## 2022-09-03 MED ORDER — ALBUTEROL SULFATE HFA 108 (90 BASE) MCG/ACT IN AERS: 2.0000 | INHALATION_SPRAY | RESPIRATORY_TRACT | 0 refills | Status: AC | PRN

## 2022-09-03 NOTE — ED Provider Notes (Signed)
[place] EMERGENCY DEPARTMENT AT [facility] Provider Note   CSN: [id number] Arrival date & time: 09/03/22  1557     History  Chief Complaint  Patient presents with   Shortness of Breath    Keith Henry is a 47 y.o. male.  Pt is a 47 yo male with pmhx significant for asthma and tobacco abuse.  Pt called EMS today b/c he was sob.  He ran out of his inhaler 1 week ago.  He was wheezing, so EMS gave him a duoneb and solumedrol 125 mg IV.   Pt is difficult to keep awake on exam.  He had this same problem when he was in the ED on 6/30.       Home Medications Prior to Admission medications   Medication Sig Start Date End Date Taking? Authorizing Provider  albuterol (PROVENTIL) (2.5 MG/3ML) 0.083% nebulizer solution Inhale 3 mLs (2.5 mg total) by nebulization every 6 (six) hours as needed for wheezing or shortness of breath. 07/06/22   Nira Conn, MD  albuterol (VENTOLIN HFA) 108 (90 Base) MCG/ACT inhaler Inhale 2 puffs into the lungs every 4 (four) hours as needed for wheezing or shortness of breath. 07/06/22   Cardama, Amadeo Garnet, MD  amoxicillin (AMOXIL) 500 MG capsule Take 1 capsule (500 mg total) by mouth 3 (three) times daily. 03/29/22   Antony Madura, PA-C  diclofenac (VOLTAREN) 50 MG EC tablet Take 1 tablet (50 mg total) by mouth 2 (two) times daily with a meal. 03/29/22   Antony Madura, PA-C  fluticasone-salmeterol (ADVAIR) 250-50 MCG/ACT AEPB Inhale 1 puff into the lungs in the morning and at bedtime. 07/06/22   Cardama, Amadeo Garnet, MD  methocarbamol (ROBAXIN) 500 MG tablet Take 1 tablet (500 mg total) by mouth every 12 (twelve) hours as needed for muscle spasms. 03/29/22   Antony Madura, PA-C      Allergies    Other and Pork-derived products    Review of Systems   Review of Systems  Respiratory:  Positive for shortness of breath and wheezing.   All other systems reviewed and are negative.   Physical Exam Updated Vital Signs BP 121/78   Pulse 72    Temp 97.8 F (36.6 C) (Axillary)   Resp 16   Ht 5\' 10"  (1.778 m)   Wt 95.3 kg   SpO2 97%   BMI 30.15 kg/m  Physical Exam Vitals and nursing note reviewed.  Constitutional:      Appearance: He is well-developed.  HENT:     Head: Normocephalic and atraumatic.     Mouth/Throat:     Mouth: Mucous membranes are moist.     Pharynx: Oropharynx is clear.  Eyes:     Extraocular Movements: Extraocular movements intact.     Pupils: Pupils are equal, round, and reactive to light.  Cardiovascular:     Rate and Rhythm: Normal rate and regular rhythm.  Pulmonary:     Breath sounds: Wheezing present.  Abdominal:     General: Bowel sounds are normal.     Palpations: Abdomen is soft.  Musculoskeletal:        General: Normal range of motion.     Cervical back: Normal range of motion and neck supple.  Skin:    General: Skin is warm.     Capillary Refill: Capillary refill takes less than 2 seconds.  Neurological:     General: No focal deficit present.     Mental Status: He is alert and oriented to person,  place, and time.  Psychiatric:        Mood and Affect: Mood normal.        Behavior: Behavior normal.     ED Results / Procedures / Treatments   Labs (all labs ordered are listed, but only abnormal results are displayed) Labs Reviewed  BASIC METABOLIC PANEL - Abnormal; Notable for the following components:      Result Value   Glucose, Bld 119 (*)    Calcium 8.2 (*)    All other components within normal limits  CBC WITH DIFFERENTIAL/PLATELET - Abnormal; Notable for the following components:   RBC 3.81 (*)    Hemoglobin 12.6 (*)    HCT 37.3 (*)    All other components within normal limits  I-STAT VENOUS BLOOD GAS, ED - Abnormal; Notable for the following components:   pCO2, Ven 38.6 (*)    pO2, Ven 58 (*)    Calcium, Ion 1.08 (*)    HCT 37.0 (*)    Hemoglobin 12.6 (*)    All other components within normal limits  BRAIN NATRIURETIC PEPTIDE    EKG EKG  Interpretation Date/Time:  Wednesday September 03 2022 15:58:15 EDT Ventricular Rate:  80 PR Interval:  151 QRS Duration:  133 QT Interval:  387 QTC Calculation: 447 R Axis:   57  Text Interpretation: Sinus rhythm Nonspecific intraventricular conduction delay Borderline T abnormalities, inferior leads No significant change since last tracing Confirmed by Jacalyn Lefevre [phone]) on 09/03/2022 4:35:30 PM  Radiology DG Chest Port 1 View  Result Date: 09/03/2022 CLINICAL DATA:  Short of breath EXAM: PORTABLE CHEST 1 VIEW COMPARISON:  08/03/2022 FINDINGS: Single frontal view of the chest demonstrates an unremarkable cardiac silhouette. No acute airspace disease, effusion, or pneumothorax. Prior healed right clavicular fracture. No acute bony abnormalities. IMPRESSION: 1. No acute intrathoracic process. Electronically Signed   By: Sharlet Salina M.D.   On: 09/03/2022 16:42    Procedures Procedures    Medications Ordered in ED Medications  albuterol (VENTOLIN HFA) 108 (90 Base) MCG/ACT inhaler 1-2 puff (2 puffs Inhalation Given 09/03/22 1827)  aerochamber Z-Stat Plus/medium 1 each (1 each Other Given 09/03/22 1828)    ED Course/ Medical Decision Making/ A&P                                 Medical Decision Making Amount and/or Complexity of Data Reviewed Labs: ordered. Radiology: ordered.  Risk Prescription drug management.   This patient presents to the ED for concern of sob, this involves an extensive number of treatment options, and is a complaint that carries with it a high risk of complications and morbidity.  The differential diagnosis includes copd, asthma, pna   Co morbidities that complicate the patient evaluation  Asthma and tobacco abuse   Additional history obtained:  Additional history obtained from epic chart review External records from outside source obtained and reviewed including EMS report   Lab Tests:  I Ordered, and personally interpreted labs.  The  pertinent results include:  vbg with ph 7.4, pCO2 38.5; bmp nl, cbc with hgb 12.6 (stable)   Imaging Studies ordered:  I ordered imaging studies including cxr  I independently visualized and interpreted imaging which showed No acute intrathoracic process.  I agree with the radiologist interpretation   Cardiac Monitoring:  The patient was maintained on a cardiac monitor.  I personally viewed and interpreted the cardiac monitored which showed an underlying rhythm  of: nsr   Medicines ordered and prescription drug management:  I ordered medication including albuterol inhaler  for sx  Reevaluation of the patient after these medicines showed that the patient improved I have reviewed the patients home medicines and have made adjustments as needed  Problem List / ED Course:  Asthma exac:  Pt given an inhaler and spacer.  He is more awake and alert now.  No evidence of hypercarbia causing sleepiness.  He said he's been working a lot.  Pt is stable for d/c.  Return if worse.    Reevaluation:  After the interventions noted above, I reevaluated the patient and found that they have :improved   Social Determinants of Health:  Lives in a hotel; no pcp   Dispostion:  After consideration of the diagnostic results and the patients response to treatment, I feel that the patent would benefit from discharge with outpatient f/u.          Final Clinical Impression(s) / ED Diagnoses Final diagnoses:  Mild intermittent asthma with exacerbation    Rx / DC Orders ED Discharge Orders     None         Jacalyn Lefevre, MD 09/03/22 2107

## 2022-09-03 NOTE — ED Triage Notes (Signed)
The pt [place]  he ran out of his inhaler one week ago c/o sob today  he was given a de0-neb on the way here and he was given solu-medrol 125 og ic on the way here

## 2022-09-03 NOTE — ED Notes (Signed)
The pt sleeping soundly  rouses when spoken loudly to

## 2022-09-27 IMAGING — CT CT RENAL STONE PROTOCOL
2 of 4 series · 16 of 46 positions shown, 18 images · non-contrast
Comparison: CT abdomen and pelvis 05/28/2019

CLINICAL DATA: Flank pain

EXAM:
CT ABDOMEN AND PELVIS WITHOUT CONTRAST
TECHNIQUE: Multidetector CT imaging of the abdomen and pelvis was performed
following the standard protocol without IV contrast.

[Series 3: ap without · axial · non-contrast · 0.77mm/px · z∈[+740,+1155]mm · 13 of 93 slices shown, 15 images]
[im 5/93  soft-tissue]
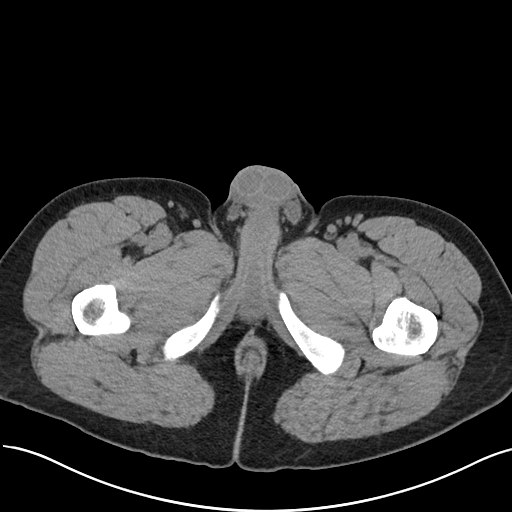
[im 5/93  bone]
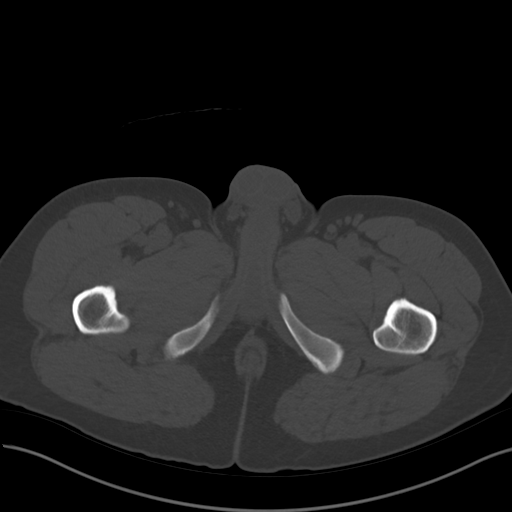
[im 14/93  soft-tissue]
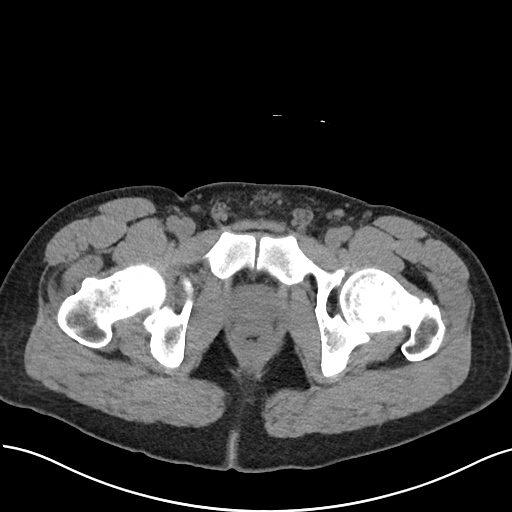
[im 19/93  soft-tissue]
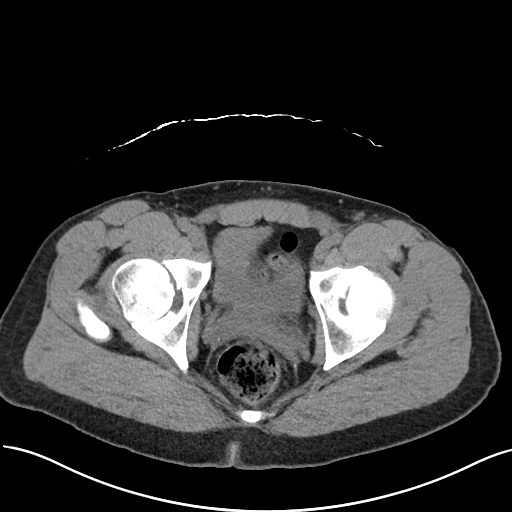
[im 28/93  soft-tissue]
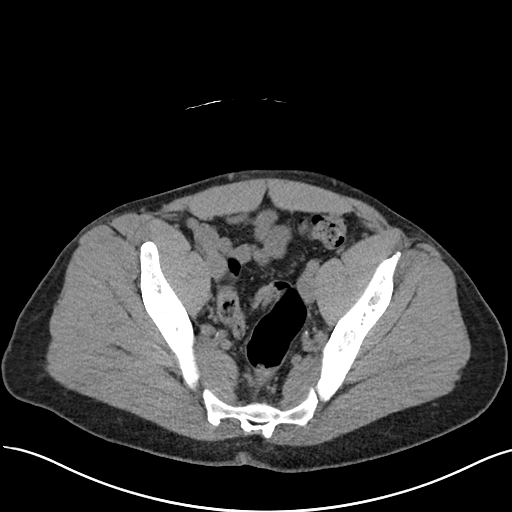
[im 33/93  soft-tissue]
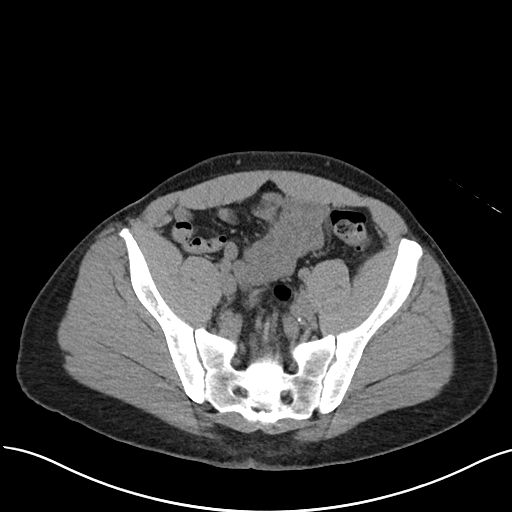
[im 42/93  soft-tissue]
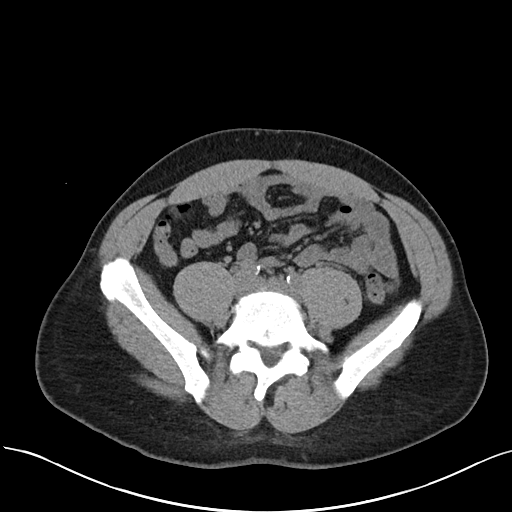
[im 47/93  soft-tissue]
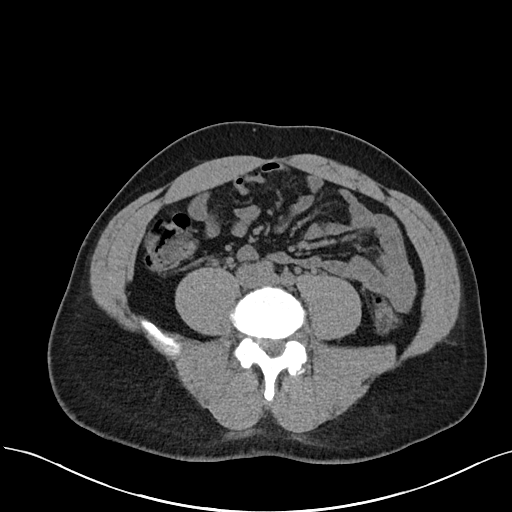
[im 51/93  soft-tissue]
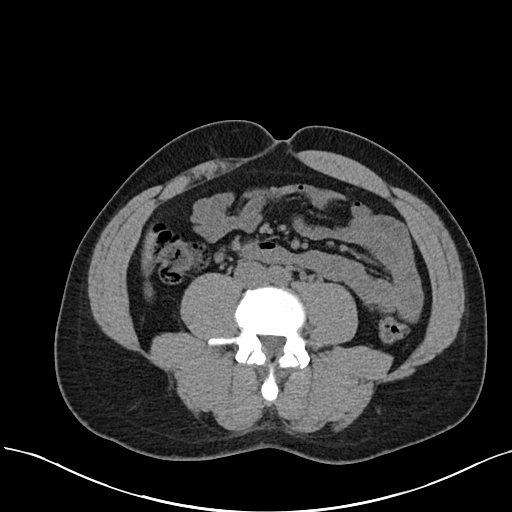
[im 60/93  soft-tissue]
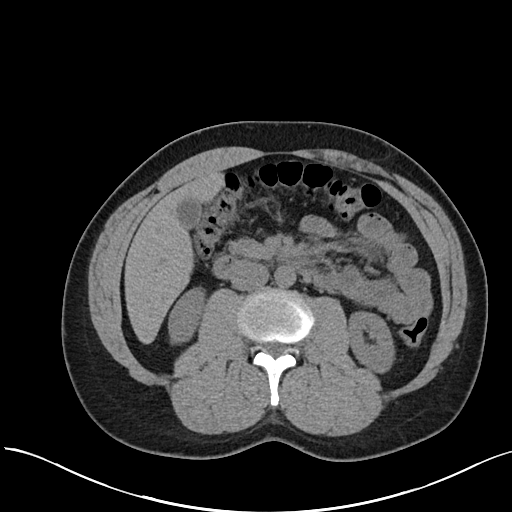
[im 60/93  bone]
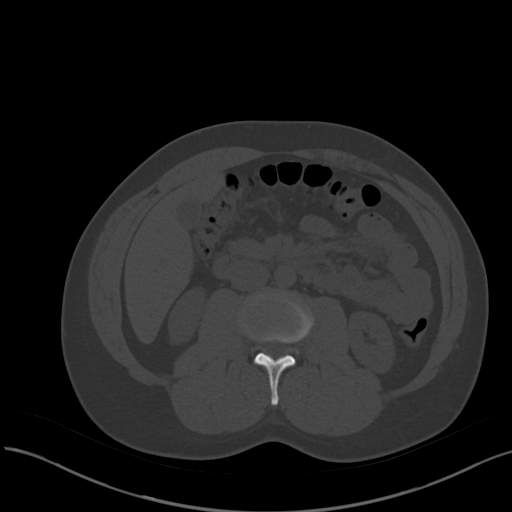
[im 65/93  soft-tissue]
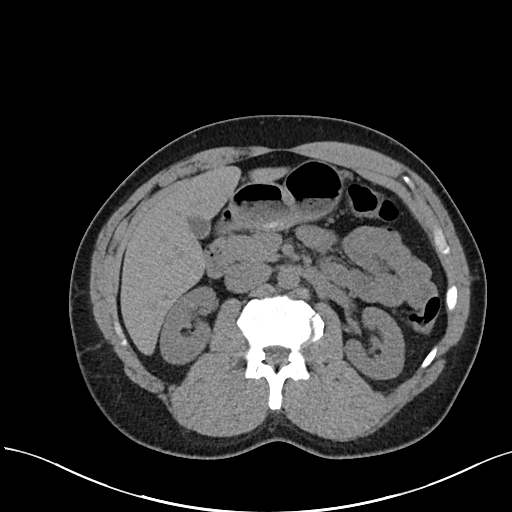
[im 74/93  soft-tissue]
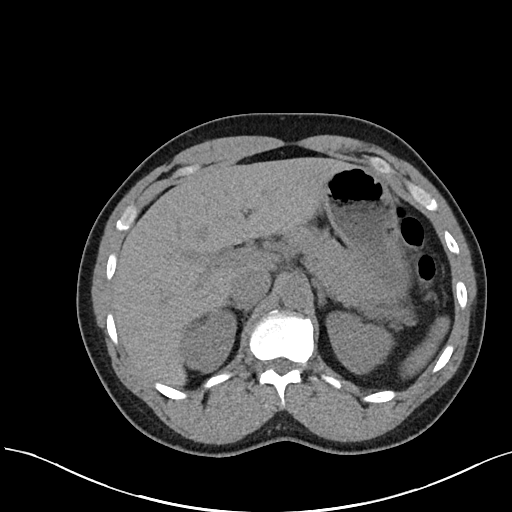
[im 79/93  soft-tissue]
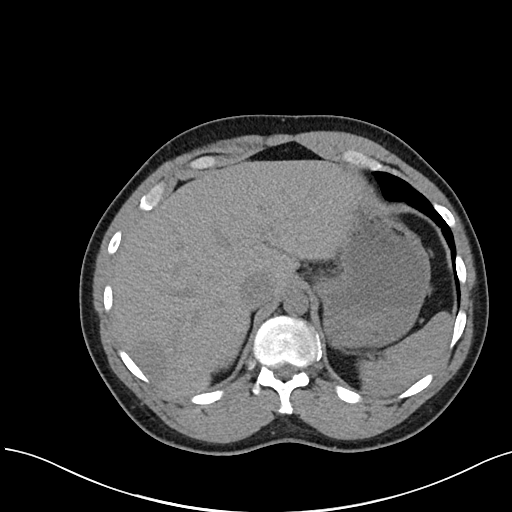
[im 88/93  soft-tissue]
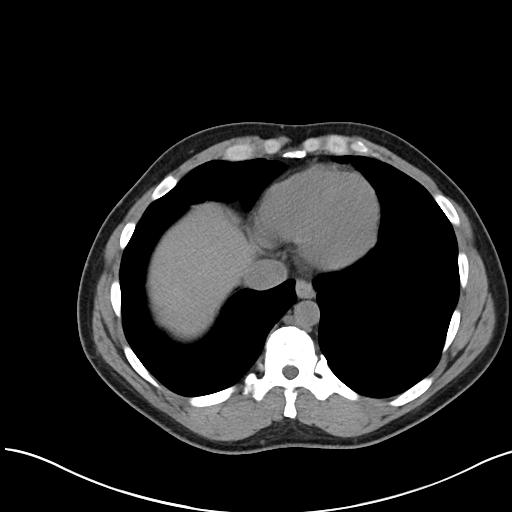

[Series 7: cor · coronal · 0.78mm/px · 3 of 94 slices shown]
[im 32/94  soft-tissue]
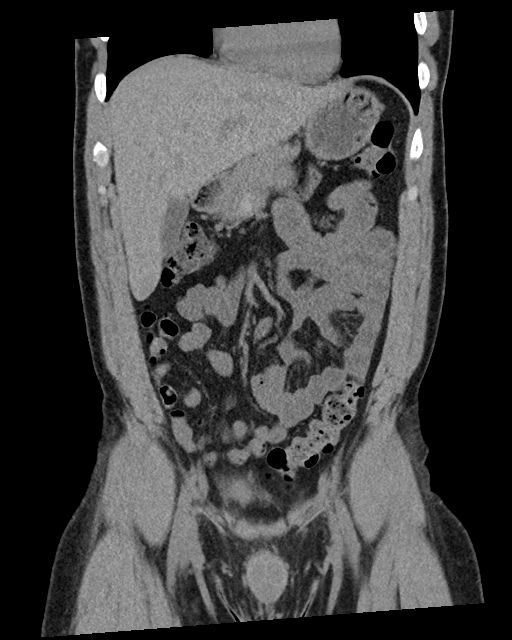
[im 42/94  soft-tissue]
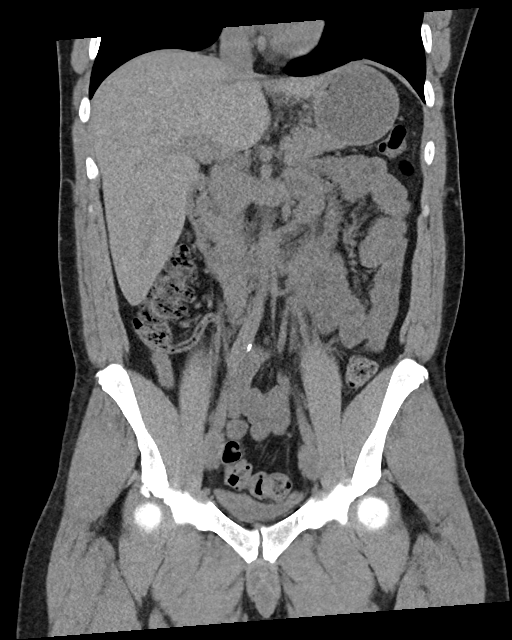
[im 52/94  soft-tissue]
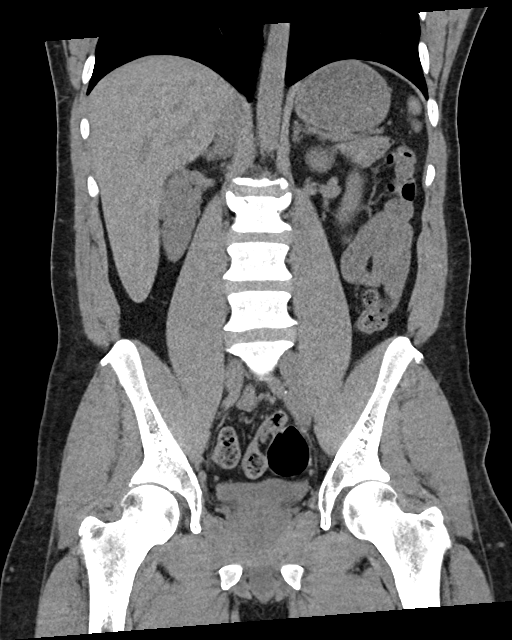

[16 of 46 positions shown; findings below may reference images not displayed]

FINDINGS: Lower chest: No acute abnormality.

Hepatobiliary: Liver is enlarged measuring 19.8 cm in length. 3.7 cm
hypodense mass in the posterior aspect of the right hepatic lobe
which was previously described as a hemangioma. Gallbladder appears
normal. No biliary ductal dilatation.

Pancreas: Unremarkable. No pancreatic ductal dilatation or
surrounding inflammatory changes.

Spleen: Normal in size without focal abnormality.

Adrenals/Urinary Tract: Adrenal glands are unremarkable. Kidneys are
normal, without renal calculi, focal lesion, or hydronephrosis.
Bladder is unremarkable.

Stomach/Bowel: Stomach is within normal limits. Appendix appears
normal. No evidence of bowel wall thickening, distention, or
inflammatory changes.

Vascular/Lymphatic: No significant vascular findings are present. No
enlarged abdominal or pelvic lymph nodes.

Reproductive: Prostate is unremarkable.

Other: Small umbilical hernia containing fat. No ascites identified.

Musculoskeletal: No suspicious bony lesions.
IMPRESSION: 1. No nephrolithiasis or obstructive uropathy.
2. Hepatomegaly. 3.7 cm mass in the posterior right hepatic lobe
which was previously described as a hemangioma.

## 2022-09-27 IMAGING — CR DG CHEST 2V
2 series · 2 of 2 positions shown · non-contrast
Comparison: 12/29/2020

CLINICAL DATA: Cough and shortness of breath for 14 days

EXAM:
CHEST - 2 VIEW

[chest pa]
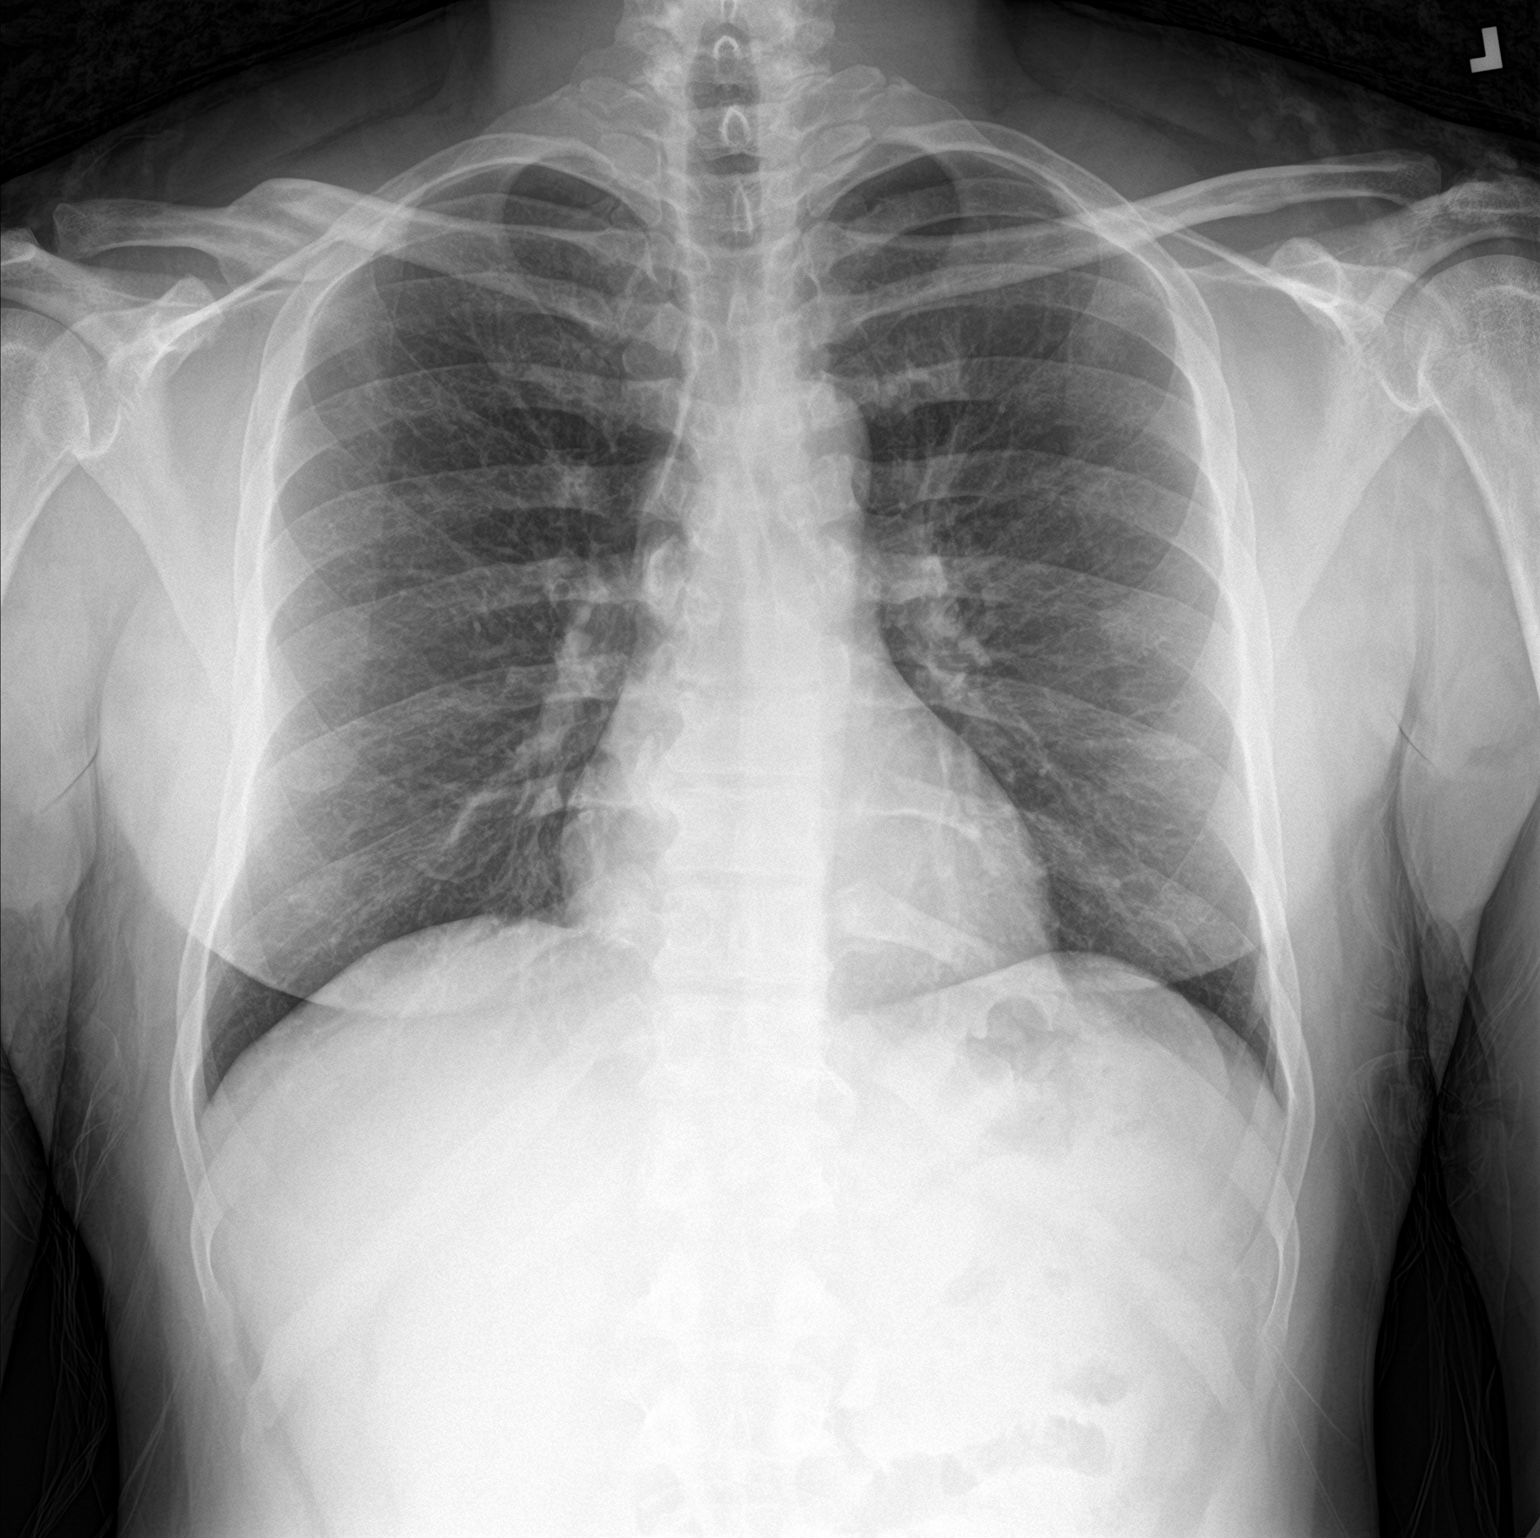

[chest lat]
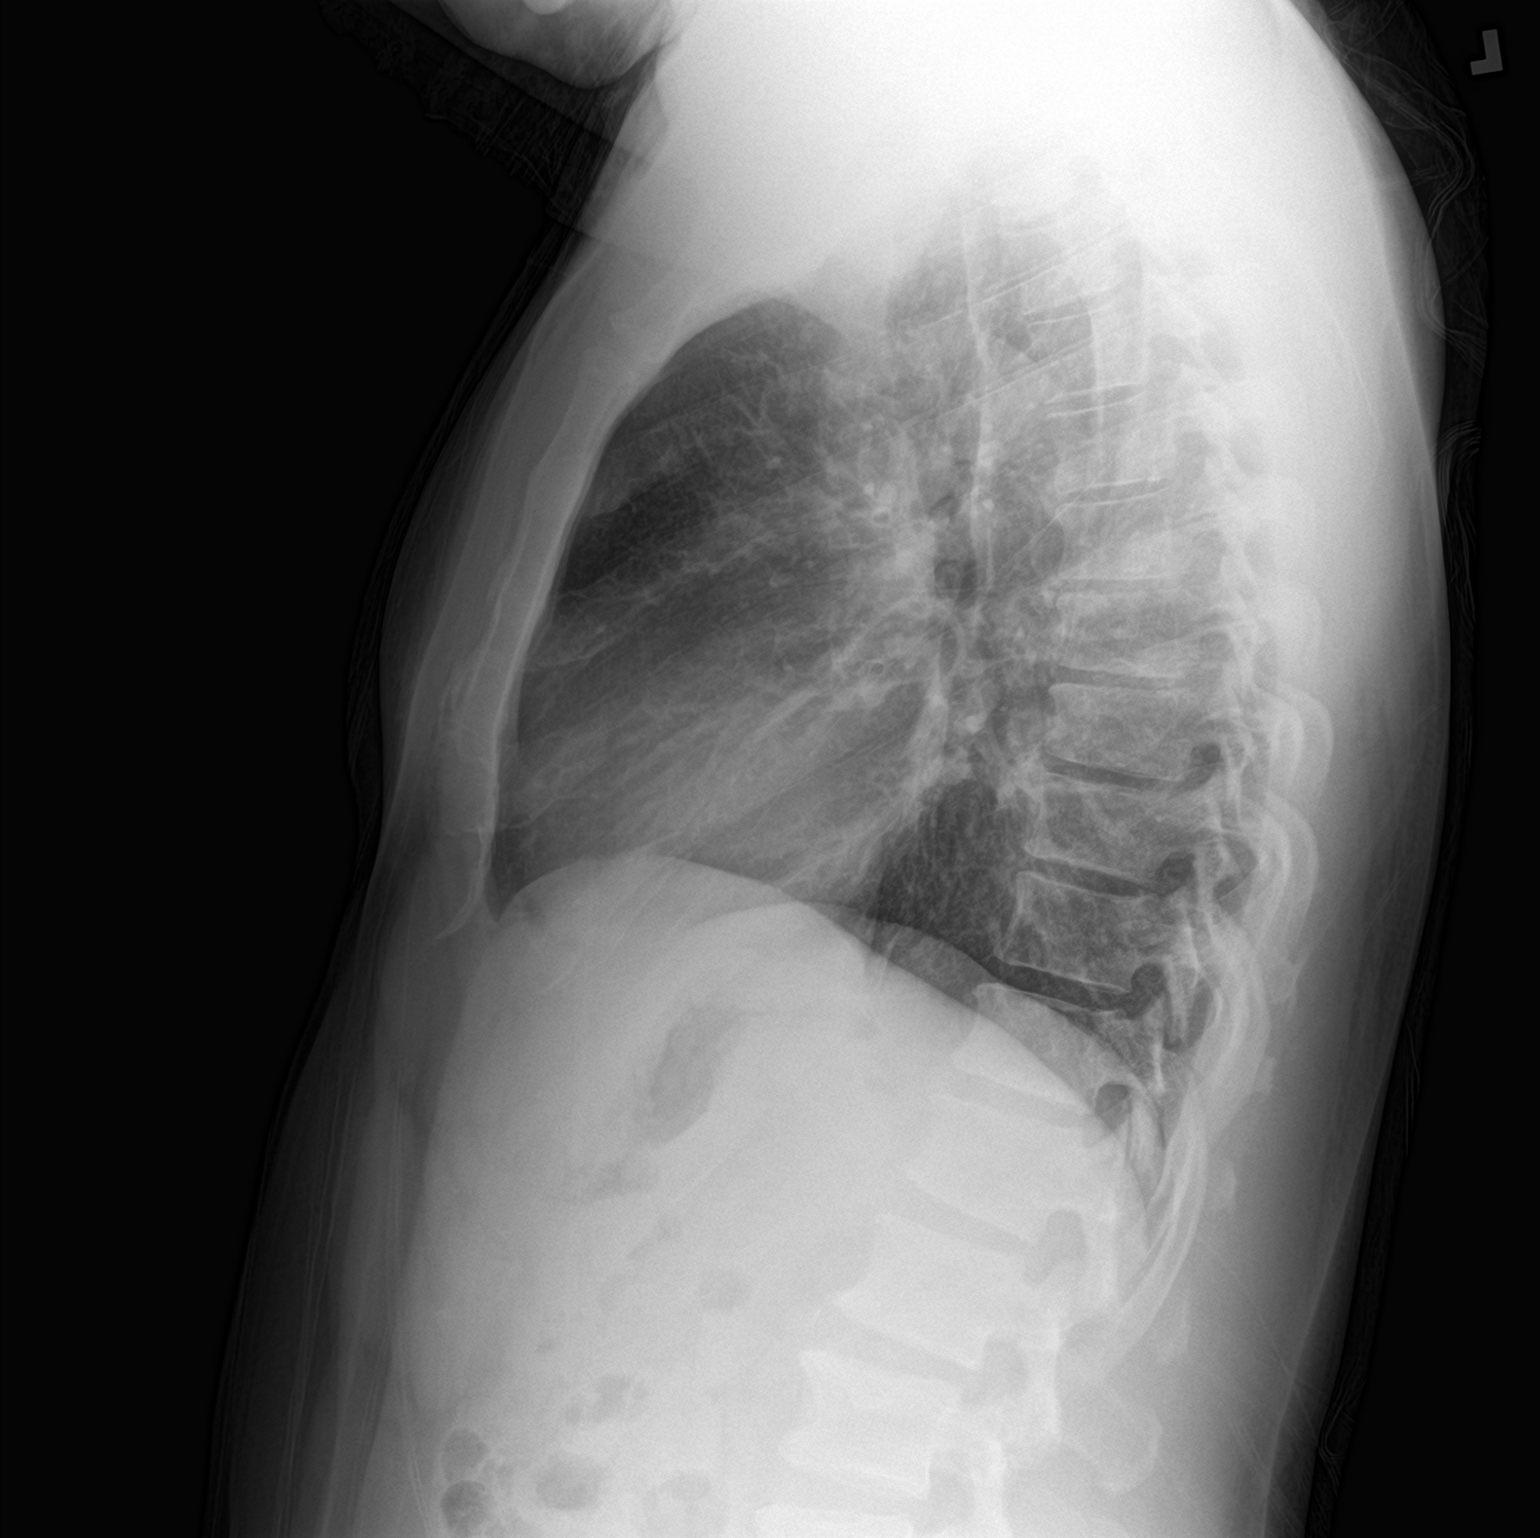

[2 of 2 positions shown; findings below may reference images not displayed]

FINDINGS: Normal heart size and mediastinal contours. Chronic mild airway
thickening. No acute infiltrate or edema. No effusion or
pneumothorax. No acute osseous findings. Healed right mid clavicle
fracture.
IMPRESSION: Chronic airway thickening.  No acute finding.
